# Patient Record
Sex: Male | Born: 1960 | Race: Black or African American | Hispanic: No | Marital: Married | Smoking: Never smoker
Health system: Southern US, Community
[De-identification: ages and names within clinical notes are randomized; demographics above are authoritative.]

## PROBLEM LIST (undated history)

## (undated) DIAGNOSIS — Z789 Other specified health status: Secondary | ICD-10-CM

## (undated) DIAGNOSIS — Z905 Acquired absence of kidney: Secondary | ICD-10-CM

## (undated) DIAGNOSIS — Z8719 Personal history of other diseases of the digestive system: Secondary | ICD-10-CM

## (undated) DIAGNOSIS — C801 Malignant (primary) neoplasm, unspecified: Secondary | ICD-10-CM

## (undated) DIAGNOSIS — I10 Essential (primary) hypertension: Secondary | ICD-10-CM

## (undated) HISTORY — PX: COLON SURGERY: SHX602

## (undated) HISTORY — PX: COLONOSCOPY: SHX174

## (undated) HISTORY — PX: APPENDECTOMY: SHX54

## (undated) HISTORY — PX: NO PAST SURGERIES: SHX2092

---

## 2008-07-02 ENCOUNTER — Emergency Department: Payer: Self-pay | Admitting: Internal Medicine

## 2013-02-17 ENCOUNTER — Ambulatory Visit: Payer: Self-pay | Admitting: Gastroenterology

## 2015-05-03 DIAGNOSIS — M19011 Primary osteoarthritis, right shoulder: Secondary | ICD-10-CM | POA: Insufficient documentation

## 2015-07-21 ENCOUNTER — Encounter: Payer: Self-pay | Admitting: *Deleted

## 2015-07-26 ENCOUNTER — Ambulatory Visit: Payer: BLUE CROSS/BLUE SHIELD | Admitting: Student in an Organized Health Care Education/Training Program

## 2015-07-26 ENCOUNTER — Ambulatory Visit
Admission: RE | Admit: 2015-07-26 | Discharge: 2015-07-26 | Disposition: A | Discharge status: Home / Self Care | Payer: BLUE CROSS/BLUE SHIELD | Source: Ambulatory Visit | Attending: Surgery | Admitting: Surgery

## 2015-07-26 ENCOUNTER — Encounter
Admission: RE | Disposition: A | Discharge status: Home / Self Care | Payer: Self-pay | Source: Ambulatory Visit | Attending: Surgery

## 2015-07-26 DIAGNOSIS — Z8042 Family history of malignant neoplasm of prostate: Secondary | ICD-10-CM | POA: Insufficient documentation

## 2015-07-26 DIAGNOSIS — Z803 Family history of malignant neoplasm of breast: Secondary | ICD-10-CM | POA: Insufficient documentation

## 2015-07-26 DIAGNOSIS — Z8249 Family history of ischemic heart disease and other diseases of the circulatory system: Secondary | ICD-10-CM | POA: Insufficient documentation

## 2015-07-26 DIAGNOSIS — M19011 Primary osteoarthritis, right shoulder: Secondary | ICD-10-CM | POA: Insufficient documentation

## 2015-07-26 DIAGNOSIS — Z79899 Other long term (current) drug therapy: Secondary | ICD-10-CM | POA: Insufficient documentation

## 2015-07-26 HISTORY — DX: Essential (primary) hypertension: I10

## 2015-07-26 HISTORY — DX: Other specified health status: Z78.9

## 2015-07-26 HISTORY — PX: RESECTION DISTAL CLAVICAL: SHX5053

## 2015-07-26 SURGERY — EXCISION, CLAVICLE, DISTAL, OPEN
Anesthesia: Regional | Laterality: Right | Wound class: Clean

## 2015-07-26 MED ORDER — GLYCOPYRROLATE 0.2 MG/ML IJ SOLN
INTRAMUSCULAR | Status: DC | PRN
  Administered 2015-07-26: 0.2 mg via INTRAVENOUS

## 2015-07-26 MED ORDER — HYDROCODONE-ACETAMINOPHEN 5-325 MG PO TABS: 1.0000 | ORAL_TABLET | Freq: Four times a day (QID) | ORAL | Status: DC | PRN

## 2015-07-26 MED ORDER — ACETAMINOPHEN 160 MG/5ML PO SOLN: 325.0000 mg | ORAL | Status: DC | PRN

## 2015-07-26 MED ORDER — PROPOFOL 10 MG/ML IV BOLUS
INTRAVENOUS | Status: DC | PRN
  Administered 2015-07-26: 180 mg via INTRAVENOUS

## 2015-07-26 MED ORDER — FENTANYL CITRATE (PF) 100 MCG/2ML IJ SOLN: 25.0000 ug | INTRAMUSCULAR | Status: DC | PRN

## 2015-07-26 MED ORDER — FENTANYL CITRATE (PF) 100 MCG/2ML IJ SOLN
INTRAMUSCULAR | Status: DC | PRN
  Administered 2015-07-26: 100 ug via INTRAVENOUS

## 2015-07-26 MED ORDER — OXYCODONE HCL 5 MG PO TABS: 5.0000 mg | ORAL_TABLET | Freq: Once | ORAL | Status: DC | PRN

## 2015-07-26 MED ORDER — DEXAMETHASONE SODIUM PHOSPHATE 4 MG/ML IJ SOLN: 8.0000 mg | Freq: Once | INTRAMUSCULAR | Status: DC | PRN

## 2015-07-26 MED ORDER — DEXAMETHASONE SODIUM PHOSPHATE 4 MG/ML IJ SOLN
INTRAMUSCULAR | Status: DC | PRN
  Administered 2015-07-26: 4 mg via PERINEURAL
  Administered 2015-07-26: 4 mg via INTRAVENOUS

## 2015-07-26 MED ORDER — ACETAMINOPHEN 325 MG PO TABS: 325.0000 mg | ORAL_TABLET | ORAL | Status: DC | PRN

## 2015-07-26 MED ORDER — ONDANSETRON HCL 4 MG/2ML IJ SOLN
INTRAMUSCULAR | Status: DC | PRN
  Administered 2015-07-26: 4 mg via INTRAVENOUS

## 2015-07-26 MED ORDER — CEFAZOLIN SODIUM-DEXTROSE 2-4 GM/100ML-% IV SOLN
2.0000 g | Freq: Once | INTRAVENOUS | Status: AC
  Administered 2015-07-26: 2 g via INTRAVENOUS

## 2015-07-26 MED ORDER — ROPIVACAINE HCL 5 MG/ML IJ SOLN
INTRAMUSCULAR | Status: DC | PRN
  Administered 2015-07-26: 35 mL via PERINEURAL

## 2015-07-26 MED ORDER — MIDAZOLAM HCL 2 MG/2ML IJ SOLN
INTRAMUSCULAR | Status: DC | PRN
  Administered 2015-07-26: 2 mg via INTRAVENOUS

## 2015-07-26 MED ORDER — LACTATED RINGERS IV SOLN
INTRAVENOUS | Status: DC
  Administered 2015-07-26: 10:00:00 via INTRAVENOUS

## 2015-07-26 MED ORDER — BUPIVACAINE-EPINEPHRINE 0.5% -1:200000 IJ SOLN
INTRAMUSCULAR | Status: DC | PRN
  Administered 2015-07-26: 20 mL

## 2015-07-26 MED ORDER — LIDOCAINE HCL (CARDIAC) 20 MG/ML IV SOLN
INTRAVENOUS | Status: DC | PRN
  Administered 2015-07-26: 30 mg via INTRATRACHEAL

## 2015-07-26 MED ORDER — OXYCODONE HCL 5 MG/5ML PO SOLN: 5.0000 mg | Freq: Once | ORAL | Status: DC | PRN

## 2015-07-26 SURGICAL SUPPLY — 24 items
BENZOIN TINCTURE PRP APPL 2/3 (GAUZE/BANDAGES/DRESSINGS) ×3 IMPLANT
BLADE MED AGGRESSIVE (BLADE) ×3 IMPLANT
CANISTER SUCT 1200ML W/VALVE (MISCELLANEOUS) ×3 IMPLANT
CHLORAPREP W/TINT 26ML (MISCELLANEOUS) ×3 IMPLANT
COVER LIGHT HANDLE UNIVERSAL (MISCELLANEOUS) ×6 IMPLANT
DRAPE IMP U-DRAPE 54X76 (DRAPES) ×3 IMPLANT
DRSG TEGADERM 4X4.75 (GAUZE/BANDAGES/DRESSINGS) ×6 IMPLANT
GAUZE PETRO XEROFOAM 1X8 (MISCELLANEOUS) ×3 IMPLANT
GAUZE SPONGE 4X4 12PLY STRL (GAUZE/BANDAGES/DRESSINGS) ×3 IMPLANT
GLOVE BIO SURGEON STRL SZ8 (GLOVE) ×6 IMPLANT
GLOVE INDICATOR 8.0 STRL GRN (GLOVE) ×3 IMPLANT
GOWN STRL REUS W/ TWL LRG LVL3 (GOWN DISPOSABLE) ×1 IMPLANT
GOWN STRL REUS W/ TWL XL LVL3 (GOWN DISPOSABLE) ×1 IMPLANT
GOWN STRL REUS W/TWL LRG LVL3 (GOWN DISPOSABLE) ×2
GOWN STRL REUS W/TWL XL LVL3 (GOWN DISPOSABLE) ×2
KIT ROOM TURNOVER OR (KITS) ×3 IMPLANT
NEEDLE HYPO 21X1.5 SAFETY (NEEDLE) ×3 IMPLANT
NS IRRIG 500ML POUR BTL (IV SOLUTION) ×3 IMPLANT
PACK ARTHROSCOPY SHOULDER (MISCELLANEOUS) ×3 IMPLANT
SLING ARM LRG DEEP (SOFTGOODS) ×3 IMPLANT
STAPLER SKIN PROX 35W (STAPLE) ×3 IMPLANT
STRAP BODY AND KNEE 60X3 (MISCELLANEOUS) ×3 IMPLANT
SUT VIC AB 2-0 CT1 27 (SUTURE) ×4
SUT VIC AB 2-0 CT1 TAPERPNT 27 (SUTURE) ×2 IMPLANT

## 2015-07-26 NOTE — Transfer of Care (Signed)
Immediate Anesthesia Transfer of Care Note  Patient: Patrick Pittman  Procedure(s) Performed: Procedure(s): RESECTION DISTAL CLAVICAL (Right)  Patient Location: PACU  Anesthesia Type: General, Regional  Level of Consciousness: awake, alert  and patient cooperative  Airway and Oxygen Therapy: Patient Spontanous Breathing and Patient connected to supplemental oxygen  Post-op Assessment: Post-op Vital signs reviewed, Patient's Cardiovascular Status Stable, Respiratory Function Stable, Patent Airway and No signs of Nausea or vomiting  Post-op Vital Signs: Reviewed and stable  Complications: No apparent anesthesia complications

## 2015-07-26 NOTE — Discharge Instructions (Addendum)
General Anesthesia, Adult, Care After Refer to this sheet in the next few weeks. These instructions provide you with information on caring for yourself after your procedure. Your health care provider may also give you more specific instructions. Your treatment has been planned according to current medical practices, but problems sometimes occur. Call your health care provider if you have any problems or questions after your procedure. WHAT TO EXPECT AFTER THE PROCEDURE After the procedure, it is typical to experience:  Sleepiness.  Nausea and vomiting. HOME CARE INSTRUCTIONS  For the first 24 hours after general anesthesia:  Have a responsible person with you.  Do not drive a car. If you are alone, do not take public transportation.  Do not drink alcohol.  Do not take medicine that has not been prescribed by your health care provider.  Do not sign important papers or make important decisions.  You may resume a normal diet and activities as directed by your health care provider.  Change bandages (dressings) as directed.  If you have questions or problems that seem related to general anesthesia, call the hospital and ask for the anesthetist or anesthesiologist on call. SEEK MEDICAL CARE IF:  You have nausea and vomiting that continue the day after anesthesia.  You develop a rash. SEEK IMMEDIATE MEDICAL CARE IF:   You have difficulty breathing.  You have chest pain.  You have any allergic problems.   This information is not intended to replace advice given to you by your health care provider. Make sure you discuss any questions you have with your health care provider.   Document Released: 04/01/2000 Document Revised: 01/14/2014 Document Reviewed: 04/24/2011 Elsevier Interactive Patient Education 2016 [employer].  Keep sling on until nerve block wears off, then use sling as necessary for comfort. May shower with intact op-site dressing. Apply ice to affected area  frequently. Return for follow-up in 10-14 days or as scheduled.

## 2015-07-26 NOTE — Progress Notes (Signed)
Assisted Patrick Pittman ANMD with right, ultrasound guided, supraclavicular block. Side rails up, monitors on throughout procedure. See vital signs in flow sheet. Tolerated Procedure well.

## 2015-07-26 NOTE — Op Note (Signed)
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  Post-Op Diagnosis:   Same.  Procedure:   Open excision of right distal clavicle.  Surgeon:   Pascal Lux, MD  Assistant:   None  Anesthesia:   General LMA with an interscalene block placed preoperatively by the anesthesiologist  Findings:   As above.  Complications:   None  EBL:   20 cc  Fluids:   900 cc crystalloid  TT:   None  Drains:   None  Closure:   Staples  Brief Clinical Note:   The patient is a 55 year old male with a several year history of gradually worsening superior right shoulder pain. The patient's past history is notable for having sustained a severe before meals separation approximately 30 years ago which was treated nonsurgically. He denies any recent injury to the shoulder. His symptoms have persisted despite medications, activity modification, etc. His history and examination are consistent with progressive degenerative joint disease of the [facility] joint. He presents at this time for open excision of the distal clavicle.  Procedure:   After undergoing an interscalene block in the preoperative holding area, the patient was brought into the operating room and lain in the supine position. He then underwent general laryngeal mask anesthesia before being repositioned in the beach chair position using the beach chair positioner. The right shoulder and upper extremity were prepped with ChloraPrep solution before being draped sterilely. Preoperative antibiotics were administered. A surgical timeout was performed to verify the appropriate surgical site before an approximately 5-6 cm incision was made over the lateral aspect of the distal clavicle. The incision was carried down through the subcutaneous tissues to expose the delto-trapezial fascia. This fascia was split horizontally and the soft  tissues elevated subperiosteally off the distal clavicle circumferentially. The distal 10-12 mm of the distal clavicle was removed using a micro-oscillating saw and rongeurs. Areas of heterotopic ossification also were removed. The distal clavicle was assessed and deemed stable to anterior posterior translation, as well as with caudal-cranial displacement. The wound was copiously irrigated with sterile saline solution before the delto-trapezial fascia was reapproximated using 2-0 Vicryl interrupted sutures. The subcutaneous tissues also were closed using 2-0 Vicryl interrupted sutures before the skin was closed using staples. A total of 20 cc of half percent Sensorcaine with epinephrine was injected in and around the incision to help with postoperative analgesia before a sterile occlusive dressing was applied to the wound. The patient was then awakened, extubated, and returned to the recovery room in satisfactory condition after tolerating the procedure well.

## 2015-07-26 NOTE — Anesthesia Procedure Notes (Addendum)
Anesthesia Regional Block:  Supraclavicular block  Pre-Anesthetic Checklist: ,, timeout performed, Correct Patient, Correct Site, Correct Laterality, Correct Procedure, Correct Position, site marked, Risks and benefits discussed,  Surgical consent,  Pre-op evaluation,  At surgeon's request and post-op pain management  Laterality: Right  Prep: chloraprep       Needles:  Injection technique: Single-shot  Needle Type: Echogenic Stimulator Needle      Needle Gauge: 21 and 21 G    Additional Needles:  Procedures: ultrasound guided (picture in chart) Supraclavicular block  Nerve Stimulator or Paresthesia:  Response: bicep contraction, 0.45 mA,   Additional Responses:   Narrative:  Start time: 07/26/2015 10:11 AM End time: 07/26/2015 10:18 AM Injection made incrementally with aspirations every 5 mL.  Performed by: Personally  Anesthesiologist: Marchia Bond D  Additional Notes: Functioning IV was confirmed and monitors applied.  Sterile prep and drape,hand hygiene and sterile gloves were used.Ultrasound guidance: relevant anatomy identified, needle position confirmed, local anesthetic spread visualized around nerve(s)., vascular puncture avoided.  Image printed for medical record.  Negative aspiration and negative test dose prior to incremental administration of local anesthetic. The patient tolerated the procedure well. Vitals signes recorded in RN notes.   Procedure Name: LMA Insertion Date/Time: 07/26/2015 11:50 AM Performed by: Londell Moh Pre-anesthesia Checklist: Patient identified, Emergency Drugs available, Suction available, Timeout performed and Patient being monitored Patient Re-evaluated:Patient Re-evaluated prior to inductionOxygen Delivery Method: Circle system utilized Preoxygenation: Pre-oxygenation with 100% oxygen Intubation Type: IV induction LMA: LMA inserted LMA Size: 4.0 Number of attempts: 1 Placement Confirmation: positive ETCO2 and breath sounds  checked- equal and bilateral Tube secured with: Tape

## 2015-07-26 NOTE — Anesthesia Preprocedure Evaluation (Signed)
Anesthesia Evaluation  Patient identified by MRN, date of birth, ID band Patient awake    Reviewed: Allergy & Precautions, H&P , NPO status , Patient's Chart, lab work & pertinent test results, reviewed documented beta blocker date and time   Airway Mallampati: II  TM Distance: >3 FB Neck ROM: full    Dental no notable dental hx.    Pulmonary neg pulmonary ROS,    Pulmonary exam normal breath sounds clear to auscultation       Cardiovascular Exercise Tolerance: Good hypertension, On Medications  Rhythm:regular Rate:Normal     Neuro/Psych negative neurological ROS  negative psych ROS   GI/Hepatic negative GI ROS, Neg liver ROS,   Endo/Other  negative endocrine ROS  Renal/GU negative Renal ROS  negative genitourinary   Musculoskeletal   Abdominal   Peds  Hematology  (+) JEH[employer]H'S WITNESS  Anesthesia Other Findings   Reproductive/Obstetrics negative OB ROS                             Anesthesia Physical Anesthesia Plan  ASA: II  Anesthesia Plan: General and Regional   Post-op Pain Management:    Induction:   Airway Management Planned:   Additional Equipment:   Intra-op Plan:   Post-operative Plan:   Informed Consent: I have reviewed the patients History and Physical, chart, labs and discussed the procedure including the risks, benefits and alternatives for the proposed anesthesia with the patient or authorized representative who has indicated his/her understanding and acceptance.     Plan Discussed with: CRNA  Anesthesia Plan Comments:         Anesthesia Quick Evaluation

## 2015-07-26 NOTE — H&P (Signed)
Paper H&P to be scanned into permanent record. H&P reviewed. No changes. 

## 2015-07-26 NOTE — Anesthesia Postprocedure Evaluation (Signed)
Anesthesia Post Note  Patient: Patrick Pittman  Procedure(s) Performed: Procedure(s) (LRB): RESECTION DISTAL CLAVICAL (Right)  Patient location during evaluation: PACU Anesthesia Type: General and Regional Level of consciousness: awake and alert Pain management: pain level controlled Vital Signs Assessment: post-procedure vital signs reviewed and stable Respiratory status: spontaneous breathing, nonlabored ventilation and respiratory function stable Cardiovascular status: blood pressure returned to baseline and stable Postop Assessment: no signs of nausea or vomiting Anesthetic complications: no    Shonda Mandarino D Marshella Tello

## 2015-07-27 ENCOUNTER — Encounter: Payer: Self-pay | Admitting: Surgery

## 2015-08-01 ENCOUNTER — Encounter: Payer: Self-pay | Admitting: Surgery

## 2018-10-02 ENCOUNTER — Other Ambulatory Visit: Payer: Self-pay | Admitting: Internal Medicine

## 2018-10-02 DIAGNOSIS — Z20822 Contact with and (suspected) exposure to covid-19: Secondary | ICD-10-CM

## 2018-10-03 LAB — NOVEL CORONAVIRUS, NAA: SARS-CoV-2, NAA: NOT DETECTED

## 2019-01-27 ENCOUNTER — Encounter: Payer: Self-pay | Admitting: Surgery

## 2019-01-27 ENCOUNTER — Other Ambulatory Visit: Payer: Self-pay

## 2019-01-27 ENCOUNTER — Encounter: Payer: Self-pay | Admitting: Urology

## 2019-01-27 ENCOUNTER — Ambulatory Visit (INDEPENDENT_AMBULATORY_CARE_PROVIDER_SITE_OTHER): Payer: BC Managed Care – PPO | Admitting: Urology

## 2019-01-27 VITALS — BP 172/107 | HR 73 | Ht 68.0 in | Wt 227.0 lb

## 2019-01-27 DIAGNOSIS — R972 Elevated prostate specific antigen [PSA]: Secondary | ICD-10-CM | POA: Diagnosis not present

## 2019-01-27 DIAGNOSIS — Z8042 Family history of malignant neoplasm of prostate: Secondary | ICD-10-CM

## 2019-01-27 DIAGNOSIS — N5203 Combined arterial insufficiency and corporo-venous occlusive erectile dysfunction: Secondary | ICD-10-CM

## 2019-01-27 NOTE — Patient Instructions (Signed)

## 2019-01-27 NOTE — Progress Notes (Signed)
01/27/2019 12:27 PM   Patrick Pittman Apr 11, 1960 [id number]  Referring provider: Theotis Burrow, MD [address] [name],  [name] [address]  Chief Complaint  Patient presents with  . Elevated PSA    HPI: Extremely pleasant 59 year old male who presents today for further evaluation of elevated and rising PSA.  Patient underwent routine annual PSA screening by his primary care physician.  His PSA was noted to be markedly elevated to 7.3 on 11/2018.  This was repeated at the summer and had gone down to 6.2.  We have a few historical data points including a PSA of 5.41 in 2017 and 4.2 in 2018.  He has a strong family history of prostate cancer.  His father was diagnosed with prostate cancer in his 63s and underwent prostatectomy.  He also has 4 brothers all of whom have been diagnosed and treated for prostate cancer.  Up until this point, he thought that the prostate cancer had "skipped" him.  He denies any urinary symptoms.  Is a good stream.  He empties his bladder.  No gross hematuria, recurrent urinary tract infections,   No weight loss or bone pain.  He does have a personal history of erectile dysfunction.  He reports that he is in his second marriage and has a 1 and 12-year-old.  He feels like he needs Viagra because of this.  Works well.  No side effects.   PMH: Past Medical History:  Diagnosis Date  . Hypertension   . No blood products    Jehovah's witness    Surgical History: Past Surgical History:  Procedure Laterality Date  . COLONOSCOPY    . NO PAST SURGERIES    . RESECTION DISTAL CLAVICAL Right 07/26/2015   Procedure: Open excision of right distal clavicle.;  Surgeon: Corky Mull, MD;  Location: [facility];  Service: Orthopedics;  Laterality: Right;    Home Medications:  Allergies as of 01/27/2019   No Known Allergies     Medication List       Accurate as of January 27, 2019 12:27 PM. If you have any questions, ask your nurse or  doctor.        hydrochlorothiazide 25 MG tablet Commonly known as: HYDRODIURIL Take 25 mg by mouth daily.   HYDROcodone-acetaminophen 5-325 MG tablet Commonly known as: Norco Take 1-2 tablets by mouth every 6 (six) hours as needed for moderate pain. MAXIMUM TOTAL ACETAMINOPHEN DOSE IS 4000 MG PER DAY   lisinopril 10 MG tablet Commonly known as: ZESTRIL Take 10 mg by mouth daily.   sildenafil 20 MG tablet Commonly known as: REVATIO Take 20 mg by mouth. Take 2-5 tablets by mouth as needed       Allergies: No Known Allergies  Family History: Family History  Problem Relation Age of Onset  . Prostate cancer Father   . Prostate cancer Brother   . Prostate cancer Brother   . Prostate cancer Brother   . Prostate cancer Brother   . Prostate cancer Brother     Social History:  reports that he has never smoked. He has never used smokeless tobacco. He reports previous alcohol use. No history on file for drug.  ROS: UROLOGY Frequent Urination?: No Hard to postpone urination?: No Burning/pain with urination?: No Get up at night to urinate?: No Leakage of urine?: No Urine stream starts and stops?: No Trouble starting stream?: No Do you have to strain to urinate?: No Blood in urine?: No Urinary tract infection?: No  Sexually transmitted disease?: No Injury to kidneys or bladder?: No Painful intercourse?: No Weak stream?: No Erection problems?: No Penile pain?: No  Gastrointestinal Nausea?: No Vomiting?: No Indigestion/heartburn?: No Diarrhea?: No Constipation?: No  Constitutional Fever: No Night sweats?: No Weight loss?: No Fatigue?: No  Skin Skin rash/lesions?: No Itching?: No  Eyes Blurred vision?: No Double vision?: No  Ears/Nose/Throat Sore throat?: No Sinus problems?: No  Hematologic/Lymphatic Swollen glands?: No Easy bruising?: No  Cardiovascular Leg swelling?: No Chest pain?: No  Respiratory Cough?: No Shortness of breath?:  No  Endocrine Excessive thirst?: No  Musculoskeletal Back pain?: No Joint pain?: No  Neurological Headaches?: No Dizziness?: No  Psychologic Depression?: No Anxiety?: No  Physical Exam: BP (!) 172/107   Pulse 73   Ht 5\' 8"  (1.727 m)   Wt 227 lb (103 kg)   BMI 34.52 kg/m   Constitutional:  Alert and oriented, No acute distress. HEENT: [place] AT, moist mucus membranes.  Trachea midline, no masses. Cardiovascular: No clubbing, cyanosis, or edema. Respiratory: Normal respiratory effort, no increased work of breathing. GI: Abdomen is soft, nontender, nondistended, no abdominal masses Rectal: Normal rectal tone.  Secondary to habitus and compliance, only able to palpate the apex of the prostate which is abnormal. Skin: No rashes, bruises or suspicious lesions. Neurologic: Grossly intact, no focal deficits, moving all 4 extremities. Psychiatric: Normal mood and affect.  Laboratory Data: See trend as above  Assessment & Plan:    1. Elevated prostate specific antigen (PSA)  We reviewed the implications of an elevated PSA and the uncertainty surrounding it. In general, a man's PSA increases with age and is produced by both normal and cancerous prostate tissue. Differential for elevated PSA is BPH, prostate cancer, infection, recent intercourse/ejaculation, prostate infarction, recent urethroscopic manipulation (foley placement/cystoscopy) and prostatitis. Management of an elevated PSA can include observation or prostate biopsy and wediscussed this in detail. We discussed that indications for prostate biopsy are defined by age and race specific PSA cutoffs as well as a PSA velocity of 0.75/year.  Given his markedly elevated PSA and strong family history of prostate cancer, we discussed most likely we recommend prostate biopsy.  PSA is being repeated today and will call with results tomorrow with final recommendations.  We did go ahead and discuss prostate biopsy in detail today.We  discussed prostate biopsy in detail including the procedure itself, the risks of blood in the urine, stool, and ejaculate, serious infection, and discomfort. He is willing to proceed with this as discussed. - PSA  2. Family history of prostate cancer Strong family history, may warrant genetics referral if his prostate biopsy is positive  3. Combined arterial insufficiency and corporo-venous occlusive erectile dysfunction Continue sildenafil as needed   Return for Will call with results, to be announced.  Hollice Espy, MD  [facility] [address] [place], [place] [address] [phone]

## 2019-01-28 ENCOUNTER — Telehealth: Payer: Self-pay | Admitting: *Deleted

## 2019-01-28 LAB — PSA: Prostate Specific Ag, Serum: 6 ng/mL — ABNORMAL HIGH (ref 0.0–4.0)

## 2019-01-28 NOTE — Telephone Encounter (Addendum)
Left patient a VM asked to return call to setup appointment.    ----- Message from Hollice Espy, MD sent at 01/28/2019  2:55 PM EST ----- PSA is still quite elevated 6.0.  Given his strong family history and elevated PSA for his age, I think he just needs a prostate biopsy at his as discussed.  He was given the handout in the office.  Please schedule.  Hollice Espy, MD

## 2019-02-03 NOTE — Telephone Encounter (Signed)
Reached pt, appt scheduled for Feb. Pt voiced understanding for instructions.

## 2019-02-24 ENCOUNTER — Other Ambulatory Visit: Payer: Self-pay | Admitting: Urology

## 2019-02-24 ENCOUNTER — Other Ambulatory Visit: Payer: Self-pay

## 2019-02-24 ENCOUNTER — Encounter: Payer: Self-pay | Admitting: Urology

## 2019-02-24 ENCOUNTER — Ambulatory Visit (INDEPENDENT_AMBULATORY_CARE_PROVIDER_SITE_OTHER): Payer: BC Managed Care – PPO | Admitting: Urology

## 2019-02-24 VITALS — BP 167/77 | HR 72 | Ht 68.0 in | Wt 223.0 lb

## 2019-02-24 DIAGNOSIS — R972 Elevated prostate specific antigen [PSA]: Secondary | ICD-10-CM | POA: Diagnosis not present

## 2019-02-24 MED ORDER — GENTAMICIN SULFATE 40 MG/ML IJ SOLN
80.0000 mg | Freq: Once | INTRAMUSCULAR | Status: AC
  Administered 2019-02-24: 09:00:00 80 mg via INTRAMUSCULAR

## 2019-02-24 MED ORDER — LEVOFLOXACIN 500 MG PO TABS
500.0000 mg | ORAL_TABLET | Freq: Once | ORAL | Status: AC
  Administered 2019-02-24: 500 mg via ORAL

## 2019-02-24 NOTE — Progress Notes (Signed)
   02/24/19  CC:  Chief Complaint  Patient presents with  . Prostate Biopsy    HPI: 59 yo M with elevated PSA, family history of prostate cancer here for prostate biopsy.   Please see previous notes for detail.     Blood pressure (!) 167/77, pulse 72, height 5\' 8"  (1.727 m), weight 223 lb (101.2 kg). NED. A&Ox3.   No respiratory distress   Abd soft, NT, ND Normal sphincter tone  Prostate Biopsy Procedure   Informed consent was obtained after discussing risks/benefits of the procedure.  A time out was performed to ensure correct patient identity.  Pre-Procedure: - Gentamicin given prophylactically - Levaquin 500 mg administered PO -Transrectal Ultrasound performed revealing a 117 gm prostate -Moderate median lobe present  Procedure: - Prostate block performed using 10 cc 1% lidocaine and biopsies taken from sextant areas, a total of 12 under ultrasound guidance.  Post-Procedure: - Patient tolerated the procedure well - He was counseled to seek immediate medical attention if experiences any severe pain, significant bleeding, or fevers - Return in two week to discuss biopsy results   Hollice Espy, MD

## 2019-03-04 LAB — ANATOMIC PATHOLOGY REPORT: PDF Image: 0

## 2019-03-09 ENCOUNTER — Other Ambulatory Visit: Payer: Self-pay | Admitting: Urology

## 2019-03-09 NOTE — Progress Notes (Addendum)
03/10/2019 1:16 PM   Patrick Pittman 05-04-1960 [id number]    Chief Complaint  Patient presents with  . Results    HPI: Patrick Pittman is a 59 yo [place] American M who returns today to discuss newly dx prostate cancer.  Patient underwent routine annual PSA screening by his primary care physician.  His PSA was noted to be markedly elevated to 7.3 on 11/2018.  This was repeated at the summer and had gone down to 6.2.  We have a few historical data points including a PSA of 5.41 in 2017 and 4.2 in 2018. His most recent PSA is 6.0 on 01/27/19.   He had a prostate bx on 02/24/19. His path report indicates 5 of 12 cores involved low volume Gleason 3+3 up to 10% of tissue bilaterally primarily at apex and lateral bases.  TRUS 117 g/    + FH prostate cancer, several brothers treated  Baseline SHIM/ IPSS as below.   Minimal urinary symptoms despite significant prostamegaly.   SHIM    Row Name 03/10/19 1133         SHIM: Over the last 6 months:   How do you rate your confidence that you could get and keep an erection?  Low     When you had erections with sexual stimulation, how often were your erections hard enough for penetration (entering your partner)?  Sometimes (about half the time)     During sexual intercourse, how often were you able to maintain your erection after you had penetrated (entered) your partner?  Sometimes (about half the time)     During sexual intercourse, how difficult was it to maintain your erection to completion of intercourse?  Slightly Difficult     When you attempted sexual intercourse, how often was it satisfactory for you?  Most Times (much more than half the time)       SHIM Total Score   SHIM  16        Score: 1-7 Severe ED 8-11 Moderate ED 12-16 Mild-Moderate ED 17-21 Mild ED 22-25 No ED  IPSS    Row Name 03/10/19 1100         International Prostate Symptom Score   How often have you had the sensation of not emptying your bladder?   Not at All     How often have you had to urinate less than every two hours?  Less than 1 in 5 times     How often have you found you stopped and started again several times when you urinated?  Less than 1 in 5 times     How often have you found it difficult to postpone urination?  Less than 1 in 5 times     How often have you had a weak urinary stream?  Less than 1 in 5 times     How often have you had to strain to start urination?  Less than 1 in 5 times     How many times did you typically get up at night to urinate?  None     Total IPSS Score  5       Quality of Life due to urinary symptoms   If you were to spend the rest of your life with your urinary condition just the way it is now how would you feel about that?  Mostly Satisfied       Score:  1-7 Mild 8-19 Moderate 20-35 Severe  PMH: Past Medical History:  Diagnosis  Date  . Hypertension   . No blood products    Jehovah's witness    Surgical History: Past Surgical History:  Procedure Laterality Date  . COLONOSCOPY    . NO PAST SURGERIES    . RESECTION DISTAL CLAVICAL Right 07/26/2015   Procedure: Open excision of right distal clavicle.;  Surgeon: Corky Mull, MD;  Location: [facility];  Service: Orthopedics;  Laterality: Right;    Home Medications:  Allergies as of 03/10/2019   No Known Allergies     Medication List       Accurate as of March 10, 2019  1:16 PM. If you have any questions, ask your nurse or doctor.        hydrochlorothiazide 25 MG tablet Commonly known as: HYDRODIURIL Take 25 mg by mouth daily.   lisinopril 10 MG tablet Commonly known as: ZESTRIL Take 10 mg by mouth daily.   sildenafil 20 MG tablet Commonly known as: REVATIO Take 20 mg by mouth. Take 2-5 tablets by mouth as needed       Allergies: No Known Allergies  Family History: Family History  Problem Relation Age of Onset  . Prostate cancer Father   . Prostate cancer Brother   . Prostate cancer Brother   . Prostate  cancer Brother   . Prostate cancer Brother   . Prostate cancer Brother     Social History:  reports that he has never smoked. He has never used smokeless tobacco. He reports previous alcohol use. No history on file for drug.   Physical Exam: BP (!) 140/97   Pulse 86   Ht 5\' 8"  (1.727 m)   Wt 223 lb (101.2 kg)   BMI 33.91 kg/m   Constitutional:  Alert and oriented, No acute distress. HEENT: [place] AT, moist mucus membranes.  Trachea midline, no masses. Cardiovascular: No clubbing, cyanosis, or edema. Respiratory: Normal respiratory effort, no increased work of breathing. Skin: No rashes, bruises or suspicious lesions. Neurologic: Grossly intact, no focal deficits, moving all 4 extremities. Psychiatric: Normal mood and affect.  Laboratory Data:   Assessment & Plan:    1. Prostate Cancer  The patient was counseled about the natural history of prostate cancer and the standard treatment options that are available for prostate cancer. It was explained to him how his age and life expectancy, clinical stage, Gleason score, and PSA affect his prognosis, the decision to proceed with additional staging studies, as well as how that information influences recommended treatment strategies. We discussed the roles for active surveillance, radiation therapy, surgical therapy, androgen deprivation, as well as ablative therapy options for the treatment of prostate cancer as appropriate to his individual cancer situation. We discussed the risks and benefits of these options with regard to their impact on cancer control and also in terms of potential adverse events, complications, and impact on quality of life particularly related to urinary, bowel, and sexual function. The patient was encouraged to ask questions throughout the discussion today and all questions were answered to his stated satisfaction. In addition, the patient was provided with and/or directed to appropriate resources and literature for further  education about prostate cancer treatment options.  Given low volume low risk disease he falls into low risk category and would most strongly recommend active surveillance with Prostate MRI and repeat biopsy in 1 year for confirmation.  Given fairly significant gland size, he may be better served with prostate MRI with consideration of fusion for more accurate evaluation in order to avoid sampling  error.  We will discuss this further in the future as deemed necessary.  Pt agreed for active surveillance, will f/u with PSA in 6 months  Return in about 6 months (around 09/10/2019) for PSA.   [facility] [address] [place], [place] [address] [phone]  I, Lucas Mallow, am acting as a scribe for Dr. Hollice Espy,  I have reviewed the above documentation for accuracy and completeness, and I agree with the above.   Hollice Espy, MD  I spent 35 total minutes on the day of the encounter including pre-visit review of the medical record, face-to-face time with the patient, and post visit ordering of labs/imaging/tests.

## 2019-03-10 ENCOUNTER — Encounter: Payer: Self-pay | Admitting: Urology

## 2019-03-10 ENCOUNTER — Other Ambulatory Visit: Payer: Self-pay

## 2019-03-10 ENCOUNTER — Ambulatory Visit (INDEPENDENT_AMBULATORY_CARE_PROVIDER_SITE_OTHER): Payer: BC Managed Care – PPO | Admitting: Urology

## 2019-03-10 VITALS — BP 140/97 | HR 86 | Ht 68.0 in | Wt 223.0 lb

## 2019-03-10 DIAGNOSIS — C61 Malignant neoplasm of prostate: Secondary | ICD-10-CM | POA: Diagnosis not present

## 2019-09-15 ENCOUNTER — Other Ambulatory Visit: Payer: Self-pay

## 2019-09-28 ENCOUNTER — Ambulatory Visit: Payer: Self-pay | Admitting: Urology

## 2021-01-18 ENCOUNTER — Other Ambulatory Visit: Payer: Self-pay | Admitting: Program of All-Inclusive Care for the Elderly (PACE) Provider Organization

## 2021-01-25 ENCOUNTER — Ambulatory Visit: Payer: BC Managed Care – PPO | Admitting: Urology

## 2021-02-12 NOTE — Progress Notes (Signed)
02/13/21 9:09 AM   Patrick Pittman [birth year] [id number]  Referring provider:  No referring provider defined for this encounter. Chief Complaint  Patient presents with   Prostate Cancer     HPI: Patrick Pittman is a 61 y.o.male with a personal history of prostate cancer who presents today for further evaluation of his prostate cancer.   Patient underwent routine annual PSA screening by his primary care physician.  His PSA was noted to be markedly elevated to 7.3 on 11/2018.  This was repeated at the summer and had gone down to 6.2.  We have a few historical data points including a PSA of 5.41 in 2017 and 4.2 in 2018. His most recent PSA is 6.0 on 01/27/19.    He had a prostate bx on 02/24/19. His path report indicates 5 of 12 cores involved low volume Gleason 3+3 up to 10% of tissue bilaterally primarily at apex and lateral bases.  TRUS 117 g/     He elected active surveillance but he did not follow-up since his prostate cancer diagnosis.   He has a family history of prostate cancer with several of his brothers being treated.   His most recent PSA was 6.4 on 01/12/2021.   He reports today that he has a weaker stream. He not very bothered by his urinary symptoms. IPSS as below.    IPSS     Row Name 02/13/21 0900         International Prostate Symptom Score   How often have you had the sensation of not emptying your bladder? Not at All     How often have you had to urinate less than every two hours? About half the time     How often have you found you stopped and started again several times when you urinated? More than half the time     How often have you found it difficult to postpone urination? Less than half the time     How often have you had a weak urinary stream? Less than 1 in 5 times     How often have you had to strain to start urination? About half the time     How many times did you typically get up at night to urinate? 1 Time     Total IPSS Score 14        Quality of Life due to urinary symptoms   If you were to spend the rest of your life with your urinary condition just the way it is now how would you feel about that? Mostly Satisfied              Score:  1-7 Mild 8-19 Moderate 20-35 Severe   PMH: Past Medical History:  Diagnosis Date   Hypertension    No blood products    Jehovah's witness    Surgical History: Past Surgical History:  Procedure Laterality Date   COLONOSCOPY     NO PAST SURGERIES     RESECTION DISTAL CLAVICAL Right 07/26/2015   Procedure: Open excision of right distal clavicle.;  Surgeon: Corky Mull, MD;  Location: [facility];  Service: Orthopedics;  Laterality: Right;    Home Medications:  Allergies as of 02/13/2021   No Known Allergies      Medication List        Accurate as of February 13, 2021  9:09 AM. If you have any questions, ask your nurse or doctor.  STOP taking these medications    hydrochlorothiazide 25 MG tablet Commonly known as: HYDRODIURIL Stopped by: Hollice Espy, MD   lisinopril 10 MG tablet Commonly known as: ZESTRIL Stopped by: Hollice Espy, MD       TAKE these medications    sildenafil 20 MG tablet Commonly known as: REVATIO Take 20 mg by mouth. Take 2-5 tablets by mouth as needed   valsartan-hydrochlorothiazide 80-12.5 MG tablet Commonly known as: DIOVAN-HCT Take 1 tablet by mouth daily.        Allergies: No Known Allergies  Family History: Family History  Problem Relation Age of Onset   Prostate cancer Father    Prostate cancer Brother    Prostate cancer Brother    Prostate cancer Brother    Prostate cancer Brother    Prostate cancer Brother     Social History:  reports that he has never smoked. He has never used smokeless tobacco. He reports that he does not currently use alcohol. No history on file for drug use.   Physical Exam: BP (!) 150/86    Pulse 64    Ht 5\' 8"  (1.727 m)    Wt 241 lb (109.3 kg)    BMI 36.64  kg/m   Constitutional:  Alert and oriented, No acute distress. HEENT: [place] AT, moist mucus membranes.  Trachea midline, no masses. Cardiovascular: No clubbing, cyanosis, or edema. Respiratory: Normal respiratory effort, no increased work of breathing. Rectal: Normal sphincter tone,  50+  CC prostate, smooth no nodules limited by habitus and prostate size, unable to palpate base  Skin: No rashes, bruises or suspicious lesions. Neurologic: Grossly intact, no focal deficits, moving all 4 extremities. Psychiatric: Normal mood and affect.   Assessment & Plan:    Prostate cancer  - PSA stably elevated.  - Rectal exam showed a very enlarged prostate  - Recommend he undergo repeat biopsy versus MRI of prostate for active surveillance.  -He is most interested in prostate MRI which was ordered today, will serve as baseline for future comparison as well as evaluate in the setting of significant prostamegaly - We expressed importance of continuing follow-up per NCCN guideline for active surveillance and that if he has difficulty with this then he may not be a great candidate for active surveillance. He assures that he will be able to follow-up today.  - MRI; scheduled   Return for PSA only in 6 months and PSA and DRE in 1 year  (We will call with prostate MRI results, arrange for follow-up if grossly abnormal requires further discussion)  I,Kailey Littlejohn,acting as a scribe for Hollice Espy, MD.,have documented all relevant documentation on the behalf of Hollice Espy, MD,as directed by  Hollice Espy, MD while in the presence of Hollice Espy, MD.   I have reviewed the above documentation for accuracy and completeness, and I agree with the above.   Hollice Espy, MD  [facility] [address] [place], [place] [address] [phone]

## 2021-02-13 ENCOUNTER — Other Ambulatory Visit: Payer: Self-pay

## 2021-02-13 ENCOUNTER — Ambulatory Visit (INDEPENDENT_AMBULATORY_CARE_PROVIDER_SITE_OTHER): Payer: BC Managed Care – PPO | Admitting: Urology

## 2021-02-13 ENCOUNTER — Encounter: Payer: Self-pay | Admitting: Urology

## 2021-02-13 VITALS — BP 150/86 | HR 64 | Ht 68.0 in | Wt 241.0 lb

## 2021-02-13 DIAGNOSIS — C61 Malignant neoplasm of prostate: Secondary | ICD-10-CM

## 2021-02-13 NOTE — Patient Instructions (Signed)
Prostate MRI Prep: ? ?1- No ejaculation 48 hours prior to exam ? ?2- No food or drink or caffeine 4 hours prior to exam ? ?3- Fleets enema needs to be done 4 hours prior to exam  ? ?4- Urinate just prior to exam  ?

## 2021-03-01 ENCOUNTER — Ambulatory Visit
Admission: RE | Admit: 2021-03-01 | Discharge: 2021-03-01 | Disposition: A | Discharge status: Home / Self Care | Payer: BC Managed Care – PPO | Source: Ambulatory Visit | Attending: Urology | Admitting: Urology

## 2021-03-01 ENCOUNTER — Other Ambulatory Visit: Payer: Self-pay

## 2021-03-01 DIAGNOSIS — C61 Malignant neoplasm of prostate: Secondary | ICD-10-CM | POA: Diagnosis present

## 2021-03-01 MED ORDER — GADOBUTROL 1 MMOL/ML IV SOLN
10.0000 mL | Freq: Once | INTRAVENOUS | Status: AC | PRN
  Administered 2021-03-01: 10 mL via INTRAVENOUS

## 2021-03-05 ENCOUNTER — Telehealth: Payer: Self-pay | Admitting: *Deleted

## 2021-03-05 DIAGNOSIS — R1900 Intra-abdominal and pelvic swelling, mass and lump, unspecified site: Secondary | ICD-10-CM

## 2021-03-05 DIAGNOSIS — C61 Malignant neoplasm of prostate: Secondary | ICD-10-CM

## 2021-03-05 NOTE — Telephone Encounter (Addendum)
Patient informed, voiced understanding.  Placed order for CT and fusion biopsy. Voiced understanding.     ----- Message from Hollice Espy, MD sent at 03/05/2021 12:49 PM EST ----- MRI showed 2 concerning findings.  First was in regards to his prostate, it looks like there is a high-grade lesion.  Would recommend a fusion biopsy for this.  If he would like to discuss this further, we can schedule follow-up with me first.  Secondly, there is an abnormal structure in the pelvis felt to either represent a fistula or tumor.  CT of the abdomen pelvis with oral and IV contrast is recommended.  Please order this study as well.  Depending on the findings, we will make the appropriate referral.  Hollice Espy, MD

## 2021-04-06 ENCOUNTER — Other Ambulatory Visit: Payer: Self-pay | Admitting: Urology

## 2021-04-10 NOTE — Progress Notes (Signed)
? ?04/11/21 ?9:05 AM  ? ?Shrihan Putt Petrucelli ?1960-08-24 ?[id number] ? ?Referring provider:  ?[facility] ?[place] ?[place],   [id number] ?Chief Complaint  ?Patient presents with  ? Prostate Cancer  ? ? ? ? ?HPI: ?KRISTJAN DERNER is a 61 y.o.male with a personal history of prostate cancer who presents today for fusion biopsy results.  ? ?Patient underwent routine annual PSA screening by his primary care physician.  His PSA was noted to be markedly elevated to 7.3 on 11/2018.  This was repeated at the summer and had gone down to 6.2.  We have a few historical data points including a PSA of 5.41 in 2017 and 4.2 in 2018. His most recent PSA is 6.0 on 01/27/19.  ?  ?He had a prostate bx on 02/24/19. His path report indicates 5 of 12 cores involved low volume Gleason 3+3 up to 10% of tissue bilaterally primarily at apex and lateral bases.  TRUS 117 g/  ? ?His most recent PSA was 6.4 on 01/12/2021.  ? ?He elected active surveillance but he did not follow-up since his prostate cancer diagnosis until 02/13/2021.  ? ?He underwent a prostate MRI on 03/01/2021 that visualized Anterior tumor in the LEFT paramidline mid to apical transitional zone bulging the capsule, suspicious for extracapsular extension of ?this PIRADS category 5 lesion. No signs of adenopathy or bone lesion in the pelvis. Degree of restricted diffusion is concerning for high-risk disease. 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  ? ?He underwent a fusion biopsy on 04/04/2021. Pathology showed Gleason 3+4 involving 1 core affecting 40% at left apex lateral, Gleason 3+3 involving 2 cores at the left apex and right apex lateral affecting up to 20% ? ?He is accompanied by his wife today. He has biological children.  He has a strong family history of prostate cancer, 5 brothers in the family all now  with prostate cancer. ? ?Moderate baseline erectile dysfunction. ? ?PMH: ?Past Medical History:  ?Diagnosis Date  ? Hypertension   ? No blood products   ? Jehovah's witness  ? ? ?Surgical History: ?Past Surgical History:  ?Procedure Laterality Date  ? COLONOSCOPY    ? NO PAST SURGERIES    ? RESECTION DISTAL CLAVICAL Right 07/26/2015  ? Procedure: Open excision of right distal clavicle.;  Surgeon: Corky Mull, MD;  Location: [facility];  Service: Orthopedics;  Laterality: Right;  ? ? ?Home Medications:  ?Allergies as of 04/11/2021   ?No Known Allergies ?  ? ?  ?Medication List  ?  ? ?  ? Accurate as of April 11, 2021 11:59 PM. If you have any questions, ask your nurse or doctor.  ?  ?  ? ?  ? ?STOP taking these medications   ? ?hydrochlorothiazide 25 MG tablet ?Commonly known as: HYDRODIURIL ?  ? ?  ? ?TAKE these medications   ? ?sildenafil 20 MG tablet ?Commonly known as: REVATIO ?Take 20 mg by mouth. Take 2-5 tablets by mouth as needed ?  ?valsartan-hydrochlorothiazide 80-12.5 MG tablet ?Commonly known as: DIOVAN-HCT ?Take 1 tablet by mouth daily. ?  ? ?  ? ? ?Allergies:  ?No Known Allergies ? ?Family History: ?Family History  ?Problem Relation Age of Onset  ? Prostate cancer Father   ? Prostate cancer Brother   ? Prostate cancer Brother   ? Prostate cancer Brother   ?  Prostate cancer Brother   ? Prostate cancer Brother   ? ? ?Social History:  reports that he has never smoked. He has never used smokeless tobacco. He reports that he does not currently use alcohol. No history on file for drug use. ? ? ?Physical Exam: ?BP (!) 147/88   Pulse 71   Ht '5\' 10"'$  (1.778 m)   Wt 232 lb (105.2 kg)   BMI 33.29 kg/m?   ?Constitutional:  Alert and oriented, No acute distress. ?HEENT:  AT, moist mucus membranes.  Trachea midline, no masses. ?Cardiovascular: No clubbing, cyanosis, or edema. ?Respiratory: Normal respiratory effort, no increased work of breathing. ?Skin: No rashes, bruises or suspicious  lesions. ?Neurologic: Grossly intact, no focal deficits, moving all 4 extremities. ?Psychiatric: Normal mood and affect. ? ? ?Pertinent Imaging: ?CLINICAL DATA:  Prostate cancer in a 61 year old male. ?  ?EXAM: ?MR PROSTATE WITHOUT AND WITH CONTRAST ?  ?TECHNIQUE: ?Multiplanar multisequence MRI images were obtained of the pelvis ?centered about the prostate. Pre and post contrast images were ?obtained. ?  ?CONTRAST:  59m GADAVIST GADOBUTROL 1 MMOL/ML IV SOLN ?  ?COMPARISON:  None ?  ?FINDINGS: ?Prostate: ?  ?Transitional zone: In the anterior LEFT mid to apical transitional ?zone extending towards the prostate apex is a homogeneous but ?indistinct area of T2 hypointensity measuring 2.6 x 1.6 cm and ?showing marked restricted diffusion bulging the anterior capsule ?(image 24/5). PIRADS category 5 lesion. ?  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 ?  ?Peripheral zone: Linear and wedge-shaped areas of T2 hypointensity ?throughout the transitional zone. ?  ?Volume: 150.4 ?  ?Transcapsular spread: Bulging along the anterior prostate and long ?segment contact with the anterior capsule at high-risk for after ?capsular extension. Signs best reflected on image 24/5. ?  ?Seminal vesicle involvement: Absent ?  ?Neurovascular bundle involvement: Absent ?  ?Pelvic adenopathy: Absent ?  ?Bone metastasis: Absent ?  ?Other findings: An ovoid vaguely tubular structure in the RIGHT ?pelvis closely associated with the sigmoid colon and just anterior ?to the RIGHT psoas the base of which tracks towards the cecal tip ?measuring 19 x 30 mm (image 19/60). Only seen on 3 sequences and not ?on T2 weighted imaging. ?  ?IMPRESSION: ?1. Anterior tumor in the LEFT paramidline mid to apical transitional ?zone bulging the capsule, suspicious for extracapsular extension of ?this PIRADS category 5 lesion. ?2. No signs of adenopathy or bone lesion in the pelvis. Degree of ?restricted diffusion is concerning for high-risk  disease. ?3. 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 Suggest ?dedicated CT imaging for further evaluation or correlation with any ?recent cross-sectional imaging if available. ?  ?These results will be called to the ordering clinician or ?representative by the Radiologist Assistant, and communication ?documented in the PACS or [employer] ?  ?  ?Electronically Signed ?  By: GZetta BillsM.D. ?  On: 03/03/2021 15:44 ? ? ?I have personally reviewed the images and agree with radiologist interpretation.  ? ? ? ?Assessment & Plan:   ? ?Prostate cancer  ? ?-Favorable intermediate  risk, new upstaging, previously on active surveillance for low risk ?- The patient was counseled about the natural history of prostate cancer and the standard treatment options that are available for prostate cancer. It was explained to him how his age and life expectancy, clinical stage, Gleason score, and PSA affect his prognosis,  the decision to proceed with additional staging studies, as well as how that information influences recommended treatment strategies. We discussed the roles for active surveillance, radiation therapy, surgical therapy, androgen deprivation, as well as ablative therapy options for the treatment of prostate cancer as appropriate to his individual cancer situation. We discussed the risks and benefits of these options with regard to their impact on cancer control and also in terms of potential adverse events, complications, and impact on quality of life particularly related to urinary, bowel, and sexual function. The patient was encouraged to ask questions throughout the discussion today and all questions were answered to his stated satisfaction. In addition, the patient was provided with and/or directed to appropriate resources and literature for further  education about prostate cancer treatment options. ? ?We discussed surgical therapy for prostate cancer including the different available surgical approaches.  Specifically, we discussed robotic prostatectomy with pelvic lymph node dissectio

## 2021-04-11 ENCOUNTER — Ambulatory Visit (INDEPENDENT_AMBULATORY_CARE_PROVIDER_SITE_OTHER): Payer: BC Managed Care – PPO | Admitting: Urology

## 2021-04-11 VITALS — BP 147/88 | HR 71 | Ht 70.0 in | Wt 232.0 lb

## 2021-04-11 DIAGNOSIS — C61 Malignant neoplasm of prostate: Secondary | ICD-10-CM

## 2021-04-12 ENCOUNTER — Telehealth: Payer: Self-pay | Admitting: Family Medicine

## 2021-04-12 NOTE — Telephone Encounter (Signed)
Oncotype form filled out and faxed.  ?

## 2021-04-12 NOTE — Telephone Encounter (Signed)
-----   Message from Hollice Espy, MD sent at 04/12/2021  9:12 AM EDT ----- ?Please help facilitate getting an Oncotype DX test done for this patient.  His biopsy specimen will be at [facility] urology. ?

## 2021-04-17 ENCOUNTER — Telehealth: Payer: Self-pay | Admitting: Urology

## 2021-04-17 ENCOUNTER — Ambulatory Visit
Admission: RE | Admit: 2021-04-17 | Discharge: 2021-04-17 | Disposition: A | Discharge status: Home / Self Care | Payer: BC Managed Care – PPO | Source: Ambulatory Visit | Attending: Urology | Admitting: Urology

## 2021-04-17 DIAGNOSIS — K383 Fistula of appendix: Secondary | ICD-10-CM

## 2021-04-17 DIAGNOSIS — R1900 Intra-abdominal and pelvic swelling, mass and lump, unspecified site: Secondary | ICD-10-CM | POA: Diagnosis present

## 2021-04-17 DIAGNOSIS — C61 Malignant neoplasm of prostate: Secondary | ICD-10-CM

## 2021-04-17 HISTORY — DX: Malignant (primary) neoplasm, unspecified: C80.1

## 2021-04-17 LAB — POCT I-STAT CREATININE: Creatinine, Ser: 1.3 mg/dL — ABNORMAL HIGH (ref 0.61–1.24)

## 2021-04-17 MED ORDER — IOHEXOL 300 MG/ML  SOLN
100.0000 mL | Freq: Once | INTRAMUSCULAR | Status: AC | PRN
  Administered 2021-04-17: 100 mL via INTRAVENOUS

## 2021-04-17 NOTE — Telephone Encounter (Signed)
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 ? ?I recommended the following: ? ?1.  Follow-up with me in about 2 weeks to to discuss management of incidental renal mass. ? ?2.  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 ?3.  PSMA PET scan to further evaluate pelvic bony lesions in the setting of known prostate cancer history.  Ideally, the study will be done prior to our follow-up. ? ?He understands all of the following. ?

## 2021-04-17 NOTE — Telephone Encounter (Signed)
Patient informed, voiced understanding. Scheduled follow up.  ?

## 2021-04-18 ENCOUNTER — Telehealth: Payer: Self-pay | Admitting: Urology

## 2021-04-18 NOTE — Telephone Encounter (Signed)
I spoke with Dr. Nash Mantis during a peer to peer discussion with his insurance to determine coverage for his PET (PSMA) scan and stated that we will have to order a bone scintigraph.  ?

## 2021-04-23 NOTE — Telephone Encounter (Signed)
Completed PA for Oncotype DX Genomic Prostate Score test  ?ref #[id number] ?

## 2021-04-25 ENCOUNTER — Other Ambulatory Visit: Payer: Self-pay | Admitting: Urology

## 2021-04-25 NOTE — Telephone Encounter (Signed)
Oncotype results are back and scanned in chart. ?

## 2021-04-26 ENCOUNTER — Other Ambulatory Visit: Payer: Self-pay | Admitting: *Deleted

## 2021-04-26 DIAGNOSIS — C61 Malignant neoplasm of prostate: Secondary | ICD-10-CM

## 2021-04-26 NOTE — Addendum Note (Signed)
Addended by: Verlene Mayer A on: 04/26/2021 09:54 AM ? ? Modules accepted: Orders ? ?

## 2021-05-02 ENCOUNTER — Inpatient Hospital Stay: Payer: BC Managed Care – PPO

## 2021-05-02 ENCOUNTER — Encounter: Payer: Self-pay | Admitting: Surgery

## 2021-05-02 ENCOUNTER — Encounter: Payer: Self-pay | Admitting: Licensed Clinical Social Worker

## 2021-05-02 ENCOUNTER — Ambulatory Visit (INDEPENDENT_AMBULATORY_CARE_PROVIDER_SITE_OTHER): Payer: BC Managed Care – PPO | Admitting: Surgery

## 2021-05-02 ENCOUNTER — Ambulatory Visit: Payer: BC Managed Care – PPO | Admitting: Urology

## 2021-05-02 ENCOUNTER — Inpatient Hospital Stay: Payer: BC Managed Care – PPO | Attending: Oncology | Admitting: Licensed Clinical Social Worker

## 2021-05-02 VITALS — BP 150/90 | HR 72 | Temp 98.3°F | Ht 70.5 in | Wt 232.0 lb

## 2021-05-02 DIAGNOSIS — I10 Essential (primary) hypertension: Secondary | ICD-10-CM | POA: Diagnosis not present

## 2021-05-02 DIAGNOSIS — C61 Malignant neoplasm of prostate: Secondary | ICD-10-CM

## 2021-05-02 DIAGNOSIS — K388 Other specified diseases of appendix: Secondary | ICD-10-CM

## 2021-05-02 DIAGNOSIS — K383 Fistula of appendix: Secondary | ICD-10-CM

## 2021-05-02 DIAGNOSIS — Z803 Family history of malignant neoplasm of breast: Secondary | ICD-10-CM | POA: Diagnosis not present

## 2021-05-02 DIAGNOSIS — Z8042 Family history of malignant neoplasm of prostate: Secondary | ICD-10-CM

## 2021-05-02 NOTE — Progress Notes (Signed)
?05/02/2021 ? ?Reason for Visit:  Appendiceal mass and fistula ? ?Requesting Provider:  Hollice Espy, MD ? ?History of Present Illness: ?Patrick Pittman is a 61 y.o. male presenting for evaluation of possible appendiceal mass versus fistula.  The patient is a history of prostate cancer and is being followed by Dr. Erlene Quan.  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  Given this finding, he had a CT scan of the abdomen pelvis on 04/17/2021 and this confirmed the finding in the right lower quadrant of a 4.8 x 3.3 x 1.8 cm ovoid density in the body of the appendix.  This spans over to the margin of the sigmoid colon and there is concern for potential tubular connection between the 2 structures.  However, the imaging study also found a 5.4 x 4.6 x 4.7 cm solid enhancing mass of the left kidney concerning for renal cell carcinoma. ? ?The patient has a follow-up appoint with Dr. Erlene Quan next week to discuss the imaging findings with regards to his prostate cancer and newly diagnosed left renal cancer.  He presents today to discuss the findings with respect to his appendix.  The patient denies any family history of colon cancer but does report family history of prostate cancer.  He denies any weight loss or weight gain recently, denies any abdominal pain, nausea, vomiting, constipation, diarrhea, blood in his stool.  The patient reports that he had a colonoscopy in 2015 which was negative, however unable to view any records of this. ? ?Past Medical History: ?Past Medical History:  ?Diagnosis Date  ? Cancer [facility])   ? Hypertension   ? No blood products   ? Jehovah's witness  ?  ? ?Past Surgical History: ?Past Surgical History:  ?Procedure  Laterality Date  ? COLONOSCOPY    ? NO PAST SURGERIES    ? RESECTION DISTAL CLAVICAL Right 07/26/2015  ? Procedure: Open excision of right distal clavicle.;  Surgeon: Corky Mull, MD;  Location: [place];  Service: Orthopedics;  Laterality: Right;  ? ? ?Home Medications: ?Prior to Admission medications   ?Medication Sig Start Date End Date Taking? Authorizing Provider  ?sildenafil (REVATIO) 20 MG tablet Take 20 mg by mouth. Take 2-5 tablets by mouth as needed   Yes [provider]  ?valsartan-hydrochlorothiazide (DIOVAN-HCT) 80-12.5 MG tablet Take 1 tablet by mouth daily. 02/08/21  Yes [provider]  ? ? ?Allergies: ?No Known Allergies ? ?Social History: ? reports that he has never smoked. He has never been exposed to tobacco smoke. He has never used smokeless tobacco. He reports that he does not currently use alcohol. He reports that he does not use drugs.  ? ?Family History: ?Family History  ?Problem Relation Age of Onset  ? Breast cancer Mother   ? Prostate cancer Father   ? Prostate cancer Brother   ? Prostate cancer Brother 53  ?     metastatic  ? Prostate cancer Brother   ? Prostate cancer Brother   ? Prostate cancer Nephew 39  ? ? ?Review of Systems: ?Review of Systems  ?Constitutional:  Negative for chills, fever and weight loss.  ?HENT:  Negative for hearing loss.   ?Respiratory:  Negative for shortness of breath.   ?  Cardiovascular:  Negative for chest pain.  ?Gastrointestinal:  Negative for abdominal pain, blood in stool, constipation, diarrhea, nausea and vomiting.  ?Genitourinary:  Negative for dysuria.  ?Musculoskeletal:  Negative for myalgias.  ?Skin:  Negative for rash.  ?Neurological:  Negative for dizziness.  ?Psychiatric/Behavioral:  Negative for depression.   ? ?Physical Exam ?BP (!) 150/90   Pulse 72   Temp 98.3 ?F (36.8 ?C)   Ht 5' 10.5" (1.791 m)   Wt 232 lb (105.2 kg)   SpO2 96%   BMI 32.82 kg/m?  ?CONSTITUTIONAL: No acute distress, well-nourished ?HEENT:   Normocephalic, atraumatic, extraocular motion intact. ?NECK: Trachea is midline, and there is no jugular venous distension.  ?RESPIRATORY:  Lungs are clear, and breath sounds are equal bilaterally. Normal respiratory effort without pathologic use of accessory muscles. ?CARDIOVASCULAR: Heart is regular without murmurs, gallops, or rubs. ?GI: The abdomen is soft, nondistended, nontender to palpation. There were no palpable masses.  ?MUSCULOSKELETAL:  Normal muscle strength and tone in all four extremities.  No peripheral edema or cyanosis. ?SKIN: Skin turgor is normal. There are no pathologic skin lesions.  ?NEUROLOGIC:  Motor and sensation is grossly normal.  Cranial nerves are grossly intact. ?PSYCH:  Alert and oriented to person, place and time. Affect is normal. ? ?Laboratory Analysis: ?No results found for this or any previous visit (from the past 24 hour(s)). ? ?Imaging: ?CT abdomen/pelvis on 04/17/2021: ?IMPRESSION: ?1. 5.4 cm solid enhancing mass of the central left kidney, likely a ?renal cell carcinoma. No associated tumor thrombus in the left renal ?vein; no associated adenopathy identified. ?2. Soft tissue mass of concern along the appendix measures 4.8 by ?3.3 by 1.8 cm, enhances, and spans between the appendix in the ?sigmoid colon. Possibilities include chronic granulomatous tissue ?along a fistulous connection between the appendix and sigmoid colon, ?versus an indolent neuroendocrine or lymphomatous tumor of the ?appendix. Of note, this lesion is stable from 07/02/2008 although at ?that time there was a severe pancolitis and this lesion was more ?reminiscent of reactive adenopathy back in 2010. ?3. Several small sclerotic lesions of the bony pelvis and lumbar ?spine, some new from 2010. While these may reflect benign bone ?islands, in light of the patient's prostate cancer, I would ?recommend a nuclear medicine bone scan in order to ensure the lack ?of scintigraphically active lesions. ?4. Marked  prostatomegaly. ?5. Small type 1 hiatal hernia. ?6. Lower lumbar impingement due to spondylosis and degenerative disc disease. ? ?MRI prostate on 03/01/2021: ?IMPRESSION: ?1. Anterior tumor in the LEFT paramidline mid to apical transitional ?zone bulging the capsule, suspicious for extracapsular extension of ?this PIRADS category 5 lesion. ?2. No signs of adenopathy or bone lesion in the pelvis. Degree of ?restricted diffusion is concerning for high-risk disease. ?3. 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 Suggest ?dedicated CT imaging for further evaluation or correlation with any ?recent cross-sectional imaging if available. ? ? ?Assessment and Plan: ?This is a 61 y.o. 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 ? ?- Discussed with the patient the findings on both his prostate MRI as well as the CT scan of his abdomen and pelvis.  His appoint with Dr. Erlene Quan is next week but I briefly discussed with him the findings with regards also to his left kidney.  With regards to the appendiceal mass and possible fistula to the  sigmoid colon, it is unclear to me looking at the images whether there is truly a fistula communication to the sigmoid versus if I am seeing at the very tip of the appendix.  As such, we need to evaluate the bowel first and will order referral to gastroenterology for colonoscopy.  Discussed with the patient that colonoscopy may be able to assess if there is a fistulous communication to the sigmoid colon of there is a potential mass in that area.  It would not be able to get into the appendix for a biopsy of the mass but at least will help [place] with the surgical planning to see if any surgery may be needed for the sigmoid colon itself or if there are any other areas of concern within his colon.  Discussed with him the  possibilities of having to do an appendectomy and also a partial colectomy versus appendectomy alone.  Then depending on the pathology findings of the appendectomy, he may need also a completion right colectomy. ?- Once the

## 2021-05-02 NOTE — Progress Notes (Signed)
REFERRING PROVIDER: ?Hollice Espy, MD ?[address] ?[place],  [place] [address] ? ?PRIMARY PROVIDER:  ?[place] ? ?PRIMARY REASON FOR VISIT:  ?1. Prostate cancer ([place])   ?2. Family history of prostate cancer   ? ? ? ?HISTORY OF PRESENT ILLNESS:   ?Patrick Pittman, a 61 y.o. male, was seen for a [place] cancer genetics consultation at the request of Dr. Erlene Quan due to a personal and family history of prostate cancer.  Patrick Pittman presents to clinic today to discuss the possibility of a hereditary predisposition to cancer, genetic testing, and to further clarify his future cancer risks, as well as potential cancer risks for family members.  ? ?In 2021, Patrick Pittman had a prostate biopsy which showed adenocarcinoma Gleason 3+3 and elected for active surveillance. He recently had another biopsy and MRI which showed intermediate risk prostate cancer, treatment plan includes surgery and Oncotype.  ? ?CANCER HISTORY:  ?Oncology History  ? No history exists.  ? ? ?Past Medical History:  ?Diagnosis Date  ? Cancer [facility])   ? Hypertension   ? No blood products   ? Jehovah's witness  ? ? ?Past Surgical History:  ?Procedure Laterality Date  ? COLONOSCOPY    ? NO PAST SURGERIES    ? RESECTION DISTAL CLAVICAL Right 07/26/2015  ? Procedure: Open excision of right distal clavicle.;  Surgeon: Corky Mull, MD;  Location: [place];  Service: Orthopedics;  Laterality: Right;  ? ? ?Social History  ? ?Socioeconomic History  ? Marital status: Married  ?  Spouse name: Not on file  ? Number of children: Not on file  ? Years of education: Not on file  ? Highest education level: Not on file  ?Occupational History  ? Not on file  ?Tobacco Use  ? Smoking status: Never  ? Smokeless tobacco: Never  ?Substance and Sexual Activity  ? Alcohol use: Not Currently  ? Drug use: Not on file  ? Sexual activity: Not on file  ?Other Topics Concern  ? Not on file  ?Social History Narrative  ? ** Merged History  Encounter **  ?    ? ?Social Determinants of Health  ? ?Financial Resource Strain: Not on file  ?Food Insecurity: Not on file  ?Transportation Needs: Not on file  ?Physical Activity: Not on file  ?Stress: Not on file  ?Social Connections: Not on file  ?  ? ?FAMILY HISTORY:  ?We obtained a detailed, 4-generation family history.  Significant diagnoses are listed below: ?Family History  ?Problem Relation Age of Onset  ? Prostate cancer Father   ? Prostate cancer Brother   ? Prostate cancer Brother   ? Prostate cancer Brother   ? Prostate cancer Brother   ? Prostate cancer Brother   ? ?Patrick Pittman has 1 son, 7, and 1 daughter, 4. He has 4 brothers and 1 sister. All of his brothers have been diagnosed with prostate cancer. One brother has metastatic prostate cancer. His sister's son also has had prostate cancer in his 52s, treated with surgery. Patient believes his siblings may have had positive genetic testing.  ? ?Patrick Pittman's father also had prostate cancer in his 72s and died at 26. Patient had 13 paternal aunts/uncles and thinks there were cancer diagnoses from them, types unknown. Paternal grandparents passed in their 52s.  ? ?Patrick Pittman's mother had breast cancer in her 84s-60s and passed at 47. She did not have siblings. Maternal grandparents passed in their 68s.  ? ?  Patrick Pittman is aware of previous family history of genetic testing for hereditary cancer risks. There is no reported Ashkenazi Jewish ancestry. There is no known consanguinity. ? ? ? ?GENETIC COUNSELING ASSESSMENT: Patrick Pittman is a 61 y.o. male with a personal and family history of prostate cancer which is somewhat suggestive of a hereditary cancer syndrome and predisposition to cancer. We, therefore, discussed and recommended the following at today's visit.  ? ?DISCUSSION: We discussed that approximately 10% of prostate cancer is hereditary. Most cases of hereditary prostate cancer are associated with BRCA1/BRCA2 genes, although there are other genes  associated with hereditary prostate cancer as well including HOXB13. Cancers and risks are gene specific. We discussed that testing is beneficial for several reasons including knowing about cancer risks, identifying potential screening and risk-reduction options that may be appropriate, and to understand if other family members could be at risk for cancer and allow them to undergo genetic testing.  ? ?We reviewed the characteristics, features and inheritance patterns of hereditary cancer syndromes. We also discussed genetic testing, including the appropriate family members to test, the process of testing, insurance coverage and turn-around-time for results. We discussed the implications of a negative, positive and/or variant of uncertain significant result. We recommended Patrick Pittman pursue genetic testing for the [employer] CancerNext-Expanded+RNA gene panel.  ? ?The CancerNext-Expanded + RNAinsight gene panel offered by [employer] and includes sequencing and rearrangement analysis for the following 77 genes: IP, ALK, APC*, ATM*, AXIN2, BAP1, BARD1, BLM, BMPR1A, BRCA1*, BRCA2*, BRIP1*, CDC73, CDH1*,CDK4, CDKN1B, CDKN2A, CHEK2*, CTNNA1, DICER1, FANCC, FH, FLCN, GALNT12, KIF1B, LZTR1, MAX, MEN1, MET, MLH1*, MSH2*, MSH3, MSH6*, MUTYH*, NBN, NF1*, NF2, NTHL1, PALB2*, PHOX2B, PMS2*, POT1, PRKAR1A, PTCH1, PTEN*, RAD51C*, RAD51D*,RB1, RECQL, RET, SDHA, SDHAF2, SDHB, SDHC, SDHD, SMAD4, SMARCA4, SMARCB1, SMARCE1, STK11, SUFU, TMEM127, TP53*,TSC1, TSC2, VHL and XRCC2 (sequencing and deletion/duplication); EGFR, EGLN1, HOXB13, KIT, MITF, PDGFRA, POLD1 and POLE (sequencing only); EPCAM and GREM1 (deletion/duplication only). ? ?Based on Patrick Pittman personal and family history of cancer, he meets medical criteria for genetic testing. Despite that he meets criteria, he may still have an out of pocket cost. We discussed that if his out of pocket cost for testing is over $100, the laboratory will call and confirm whether he wants to  proceed with testing.  If the out of pocket cost of testing is less than $100 he will be billed by the genetic testing laboratory.  ? ?PLAN: After considering the risks, benefits, and limitations, Patrick Pittman provided informed consent to pursue genetic testing and the blood sample was sent to [facility] for analysis of the CancerNext-Expanded+RNA panel. Results should be available within approximately 2-3 weeks' time, at which point they will be disclosed by telephone to Patrick Pittman, as will any additional recommendations warranted by these results. Patrick Pittman will receive a summary of his genetic counseling visit and a copy of his results once available. This information will also be available in Epic.  ? ?Patrick Pittman's questions were answered to his satisfaction today. Our contact information was provided should additional questions or concerns arise. Thank you for the referral and allowing [place] to share in the care of your patient.  ? ?Faith Rogue, MS, LCGC ?Genetic Counselor ?[email or web address] ?Phone: [phone] ? ?The patient was seen for a total of 25 minutes in face-to-face genetic counseling.  Dr. Grayland Ormond was available for discussion regarding this case.  ? ?_______________________________________________________________________ ?For Office Staff:  ?Number of people involved in session: 1 ?Was an Intern/ student involved with case: no ? ?

## 2021-05-02 NOTE — Patient Instructions (Addendum)
We have sent a referral to [facility] so you can get a Colonoscopy to better look at the area around the appendix to determine what surgery you will need. They will call you to schedule this.  ? ?We will have you follow up here after we get your colonoscopy results.  ? ? ?Call us once you have your Colonoscopy scheduled so we can schedule a follow up with Dr Hampton Abbot.  ? ? ?

## 2021-05-03 ENCOUNTER — Encounter
Admission: RE | Admit: 2021-05-03 | Discharge: 2021-05-03 | Disposition: A | Discharge status: Home / Self Care | Payer: BC Managed Care – PPO | Source: Ambulatory Visit | Attending: Urology | Admitting: Urology

## 2021-05-03 ENCOUNTER — Telehealth: Payer: Self-pay

## 2021-05-03 DIAGNOSIS — C61 Malignant neoplasm of prostate: Secondary | ICD-10-CM | POA: Diagnosis present

## 2021-05-03 MED ORDER — TECHNETIUM TC 99M MEDRONATE IV KIT
20.0000 | PACK | Freq: Once | INTRAVENOUS | Status: AC | PRN
  Administered 2021-05-03: 19.86 via INTRAVENOUS

## 2021-05-03 NOTE — Telephone Encounter (Signed)
Called no answer voicemail full ?

## 2021-05-07 ENCOUNTER — Telehealth: Payer: Self-pay

## 2021-05-07 NOTE — Telephone Encounter (Signed)
Called no answer voicemail full sent sms message and sent letter  ?

## 2021-05-08 NOTE — Telephone Encounter (Signed)
Pt left message to get a return call for colonoscopy appointment ?

## 2021-05-09 ENCOUNTER — Other Ambulatory Visit: Payer: BC Managed Care – PPO

## 2021-05-09 ENCOUNTER — Encounter: Payer: Self-pay | Admitting: Urology

## 2021-05-09 ENCOUNTER — Ambulatory Visit (INDEPENDENT_AMBULATORY_CARE_PROVIDER_SITE_OTHER): Payer: BC Managed Care – PPO | Admitting: Urology

## 2021-05-09 ENCOUNTER — Ambulatory Visit: Payer: BC Managed Care – PPO

## 2021-05-09 VITALS — BP 169/101 | HR 76 | Ht 70.5 in | Wt 232.0 lb

## 2021-05-09 DIAGNOSIS — M899 Disorder of bone, unspecified: Secondary | ICD-10-CM | POA: Diagnosis not present

## 2021-05-09 DIAGNOSIS — N2889 Other specified disorders of kidney and ureter: Secondary | ICD-10-CM

## 2021-05-09 DIAGNOSIS — C61 Malignant neoplasm of prostate: Secondary | ICD-10-CM
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
## 2021-05-16 ENCOUNTER — Other Ambulatory Visit: Payer: Self-pay

## 2021-05-16 DIAGNOSIS — Z1211 Encounter for screening for malignant neoplasm of colon: Secondary | ICD-10-CM

## 2021-05-16 NOTE — Progress Notes (Signed)
Gastroenterology Pre-Procedure Review ? ?Request Date: 05/23/2021 ?Requesting Physician: Dr. Vicente Males ? ?PATIENT REVIEW QUESTIONS: The patient responded to the following health history questions as indicated:   ? ?1. Are you having any GI issues? no ?2. Do you have a personal history of Polyps? no ?3. Do you have a family history of Colon Cancer or Polyps? no ?4. Diabetes Mellitus? no ?5. Joint replacements in the past 12 months?no ?6. Major health problems in the past 3 months?yes (has cancer ) ?7. Any artificial heart valves, MVP, or defibrillator?no ?   ?MEDICATIONS & ALLERGIES:    ?Patient reports the following regarding taking any anticoagulation/antiplatelet therapy:   ?Plavix, Coumadin, Eliquis, Xarelto, Lovenox, Pradaxa, Brilinta, or Effient? no ?Aspirin? no ? ?Patient confirms/reports the following medications:  ?Current Outpatient Medications  ?Medication Sig Dispense Refill  ? sildenafil (REVATIO) 20 MG tablet Take 20 mg by mouth. Take 2-5 tablets by mouth as needed    ? valsartan-hydrochlorothiazide (DIOVAN-HCT) 80-12.5 MG tablet Take 1 tablet by mouth daily.    ? ?No current facility-administered medications for this visit.  ? ? ?Patient confirms/reports the following allergies:  ?No Known Allergies ? ?No orders of the defined types were placed in this encounter. ? ? ?AUTHORIZATION INFORMATION ?Primary Insurance: ?1D#: ?Group #: ? ?Secondary Insurance: ?1D#: ?Group #: ? ?SCHEDULE INFORMATION: ?Date: 05/23/2021 ?Time: ?Location: [facility] ?

## 2021-05-16 NOTE — Progress Notes (Signed)
Gave a clinpiq sample ?

## 2021-05-21 ENCOUNTER — Other Ambulatory Visit: Payer: Self-pay

## 2021-05-21 ENCOUNTER — Encounter: Payer: Self-pay | Admitting: Gastroenterology

## 2021-05-22 ENCOUNTER — Telehealth: Payer: Self-pay | Admitting: Licensed Clinical Social Worker

## 2021-05-23 ENCOUNTER — Ambulatory Visit: Payer: BC Managed Care – PPO | Admitting: Certified Registered"

## 2021-05-23 ENCOUNTER — Ambulatory Visit
Admission: RE | Admit: 2021-05-23 | Discharge: 2021-05-23 | Disposition: A | Discharge status: Home / Self Care | Payer: BC Managed Care – PPO | Attending: Gastroenterology | Admitting: Gastroenterology

## 2021-05-23 ENCOUNTER — Encounter
Admission: RE | Disposition: A | Discharge status: Home / Self Care | Payer: Self-pay | Source: Home / Self Care | Attending: Gastroenterology

## 2021-05-23 ENCOUNTER — Encounter: Payer: Self-pay | Admitting: Gastroenterology

## 2021-05-23 DIAGNOSIS — R933 Abnormal findings on diagnostic imaging of other parts of digestive tract: Secondary | ICD-10-CM | POA: Insufficient documentation

## 2021-05-23 DIAGNOSIS — R935 Abnormal findings on diagnostic imaging of other abdominal regions, including retroperitoneum: Secondary | ICD-10-CM

## 2021-05-23 DIAGNOSIS — Z1211 Encounter for screening for malignant neoplasm of colon: Secondary | ICD-10-CM

## 2021-05-23 DIAGNOSIS — D122 Benign neoplasm of ascending colon: Secondary | ICD-10-CM | POA: Diagnosis not present

## 2021-05-23 DIAGNOSIS — K621 Rectal polyp: Secondary | ICD-10-CM | POA: Insufficient documentation

## 2021-05-23 DIAGNOSIS — D120 Benign neoplasm of cecum: Secondary | ICD-10-CM | POA: Diagnosis not present

## 2021-05-23 DIAGNOSIS — K635 Polyp of colon: Secondary | ICD-10-CM | POA: Diagnosis not present

## 2021-05-23 HISTORY — PX: COLONOSCOPY WITH PROPOFOL: SHX5780

## 2021-05-23 SURGERY — COLONOSCOPY WITH PROPOFOL
Anesthesia: General

## 2021-05-23 MED ORDER — PROPOFOL 500 MG/50ML IV EMUL
INTRAVENOUS | Status: DC | PRN
  Administered 2021-05-23: 165 ug/kg/min via INTRAVENOUS

## 2021-05-23 MED ORDER — SODIUM CHLORIDE 0.9 % IV SOLN
INTRAVENOUS | Status: DC
  Administered 2021-05-23: 1000 mL via INTRAVENOUS

## 2021-05-23 MED ORDER — LIDOCAINE HCL (CARDIAC) PF 100 MG/5ML IV SOSY
PREFILLED_SYRINGE | INTRAVENOUS | Status: DC | PRN
  Administered 2021-05-23: 100 mg via INTRAVENOUS

## 2021-05-23 MED ORDER — PROPOFOL 10 MG/ML IV BOLUS
INTRAVENOUS | Status: DC | PRN
  Administered 2021-05-23: 70 mg via INTRAVENOUS
  Administered 2021-05-23: 30 mg via INTRAVENOUS

## 2021-05-23 NOTE — Op Note (Signed)
[facility] ?Gastroenterology ?Patient Name: Chaddrick Brue ?Procedure Date: 05/23/2021 11:38 AM ?MRN: [id number] ?Account #: [id number] ?Date of Birth: May 05, 1960 ?Admit Type: Outpatient ?Age: 61 ?Room: [facility] ENDO ROOM 3 ?Gender: Male ?Note Status: Finalized ?Instrument Name: Peds Colonoscope [id number] ?Procedure:             Colonoscopy ?Indications:           Abnormal CT of the GI tract ?Providers:             [name] MD, MD ?Referring MD:          Theotis Burrow (Referring MD) ?Medicines:             Monitored Anesthesia Care ?Complications:         No immediate complications. ?Procedure:             Pre-Anesthesia Assessment: ?                       - Prior to the procedure, a History and Physical was  ?                       performed, and patient medications, allergies and  ?                       sensitivities were reviewed. The patient's tolerance  ?                       of previous anesthesia was reviewed. ?                       - The risks and benefits of the procedure and the  ?                       sedation options and risks were discussed with the  ?                       patient. All questions were answered and informed  ?                       consent was obtained. ?                       - ASA Grade Assessment: III - A patient with severe  ?                       systemic disease. ?                       After obtaining informed consent, the colonoscope was  ?                       passed under direct vision. Throughout the procedure,  ?                       the patient's blood pressure, pulse, and oxygen  ?                       saturations were monitored continuously. The  ?                       Colonoscope  was introduced through the anus and  ?                       advanced to the the cecum, identified by the  ?                       appendiceal orifice. The colonoscopy was performed  ?                       with ease. The patient tolerated the procedure well.  ?                        The quality of the bowel preparation was fair. ?Findings: ?     The perianal and digital rectal examinations were normal. ?     Four sessile polyps were found in the ascending colon. The polyps were 5  ?     to 6 mm in size. These polyps were removed with a cold snare. Resection  ?     and retrieval were complete. ?     Three sessile polyps were found in the cecum. The polyps were 4 to 6 mm  ?     in size. These polyps were removed with a cold snare. Resection and  ?     retrieval were complete. ?     A 3 mm polyp was found in the rectum. The polyp was sessile. The polyp  ?     was removed with a jumbo cold forceps. Resection and retrieval were  ?     complete. ?     The exam was otherwise without abnormality on direct and retroflexion  ?     views. ?     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 ?Impression:            - Preparation of the colon was fair. ?                       - Four 5 to 6 mm polyps in the ascending colon,  ?                       removed with a cold snare. Resected and retrieved. ?                       - Three 4 to 6 mm polyps in the cecum, removed with a  ?                       cold snare. Resected and retrieved. ?                       - One 3 mm polyp in the rectum, removed with a jumbo  ?                       cold forceps. Resected and retrieved. ?                       - The examination was otherwise normal on direct and  ?                       retroflexion views. ?Recommendation:        -  Discharge patient to home (with escort). ?                       - Resume previous diet. ?                       - Continue present medications. ?                       - Await pathology results. ?                       - Repeat colonoscopy in 3 years for surveillance. ?Procedure Code(s):     --- Professional --- ?                       [phone], Colonoscopy, flexible; with removal of  ?                       tumor(s), polyp(s), or other lesion(s) by snare  ?                        technique ?                       [id number], 59, Colonoscopy, flexible; with biopsy, single  ?                       or multiple ?Diagnosis Code(s):     --- Professional --- ?                       K63.5, Polyp of colon ?                       K62.1, Rectal polyp ?                       R93.3, Abnormal findings on diagnostic imaging of  ?                       other parts of digestive tract ?CPT copyright 2019 American Medical Association. All rights reserved. ?The codes documented in this report are preliminary and upon coder review may  ?be revised to meet current compliance requirements. ?[name], MD ?[name] MD, MD ?05/23/2021 12:18:29 PM ?This report has been signed electronically. ?Number of Addenda: 0 ?Note Initiated On: 05/23/2021 11:38 AM ?Scope Withdrawal Time: 0 hours 19 minutes 8 seconds  ?Total Procedure Duration: 0 hours 27 minutes 25 seconds  ?Estimated Blood Loss:  Estimated blood loss: none. ?     [facility] ?

## 2021-05-23 NOTE — H&P (Signed)
? ? ? ?Patrick Bellows, MD ?[address], [place], [place], [place], [address] ?[address], [place], [place], [place], [address] ?Phone: [phone]  ?Fax: [phone] ? ?Primary Care Physician:  Theotis Burrow, MD ? ? ?Pre-Procedure History & Physical: ?HPI:  Patrick REASONS is a 61 y.o. male is here for an colonoscopy. ?  ?Past Medical History:  ?Diagnosis Date  ? Cancer [facility])   ? Hypertension   ? No blood products   ? Jehovah's witness  ? ? ?Past Surgical History:  ?Procedure Laterality Date  ? COLONOSCOPY    ? RESECTION DISTAL CLAVICAL Right 07/26/2015  ? Procedure: Open excision of right distal clavicle.;  Surgeon: Corky Mull, MD;  Location: [facility];  Service: Orthopedics;  Laterality: Right;  ? ? ?Prior to Admission medications   ?Medication Sig Start Date End Date Taking? Authorizing Provider  ?valsartan-hydrochlorothiazide (DIOVAN-HCT) 80-12.5 MG tablet Take 1 tablet by mouth daily. 02/08/21  Yes [provider]  ?sildenafil (REVATIO) 20 MG tablet Take 20 mg by mouth. Take 2-5 tablets by mouth as needed    [provider]  ? ? ?Allergies as of 05/16/2021  ? (No Known Allergies)  ? ? ?Family History  ?Problem Relation Age of Onset  ? Breast cancer Mother   ? Prostate cancer Father   ? Prostate cancer Brother   ? Prostate cancer Brother 38  ?     metastatic  ? Prostate cancer Brother   ? Prostate cancer Brother   ? Prostate cancer Nephew 39  ? ? ?Social History  ? ?Socioeconomic History  ? Marital status: Married  ?  Spouse name: Not on file  ? Number of children: Not on file  ? Years of education: Not on file  ? Highest education level: Not on file  ?Occupational History  ? Not on file  ?Tobacco Use  ? Smoking status: Never  ?  Passive exposure: Never  ? Smokeless tobacco: Never  ?Vaping Use  ? Vaping Use: Never used  ?Substance and Sexual Activity  ? Alcohol use: Not Currently  ? Drug use: Never  ? Sexual activity: Not on file  ?Other Topics Concern  ? Not on file   ?Social History Narrative  ? ** Merged History Encounter **  ?    ? ?Social Determinants of Health  ? ?Financial Resource Strain: Not on file  ?Food Insecurity: Not on file  ?Transportation Needs: Not on file  ?Physical Activity: Not on file  ?Stress: Not on file  ?Social Connections: Not on file  ?Intimate Partner Violence: Not on file  ? ? ?Review of Systems: ?See HPI, otherwise negative ROS ? ?Physical Exam: ?BP 136/80   Pulse 65   Temp 97.7 ?F (36.5 ?C) (Temporal)   Resp 18   Ht '5\' 10"'$  (1.778 m)   Wt 105.4 kg   SpO2 99%   BMI 33.36 kg/m?  ?General:   Alert,  pleasant and cooperative in NAD ?Head:  Normocephalic and atraumatic. ?Neck:  Supple; no masses or thyromegaly. ?Lungs:  Clear throughout to auscultation, normal respiratory effort.    ?Heart:  +S1, +S2, Regular rate and rhythm, No edema. ?Abdomen:  Soft, nontender and nondistended. Normal bowel sounds, without guarding, and without rebound.   ?Neurologic:  Alert and  oriented x4;  grossly normal neurologically. ? ?Impression/Plan: ?Patrick Pittman is here for an colonoscopy to be performed for abnormal ct scan of the abdomen   ?Risks, benefits, limitations, and alternatives regarding  colonoscopy  have been reviewed with the patient.  Questions have been answered.  All parties agreeable. ? ? ?Patrick Bellows, MD  05/23/2021, 11:33 AM ? ?

## 2021-05-23 NOTE — Anesthesia Procedure Notes (Signed)
Procedure Name: General with mask airway ?Date/Time: 05/23/2021 11:50 AM ?Performed by: Kelton Pillar, CRNA ?Pre-anesthesia Checklist: Patient identified, Emergency Drugs available, Suction available and Patient being monitored ?Patient Re-evaluated:Patient Re-evaluated prior to induction ?Oxygen Delivery Method: Simple face mask ?Induction Type: IV induction ?Placement Confirmation: positive ETCO2, CO2 detector and breath sounds checked- equal and bilateral ?Dental Injury: Teeth and Oropharynx as per pre-operative assessment  ? ? ? ? ?

## 2021-05-23 NOTE — Transfer of Care (Signed)
Immediate Anesthesia Transfer of Care Note ? ?Patient: Patrick Pittman ? ?Procedure(s) Performed: COLONOSCOPY WITH PROPOFOL ? ?Patient Location: Endoscopy Unit ? ?Anesthesia Type:General ? ?Level of Consciousness: alert , drowsy and patient cooperative ? ?Airway & Oxygen Therapy: Patient Spontanous Breathing and Patient connected to face mask oxygen ? ?Post-op Assessment: Report given to RN and Post -op Vital signs reviewed and stable ? ?Post vital signs: Reviewed and stable ? ?Last Vitals:  ?Vitals Value Taken Time  ?BP 112/65 05/23/21 1217  ?Temp 36.4 ?C 05/23/21 1217  ?Pulse 69 05/23/21 1217  ?Resp 11 05/23/21 1217  ?SpO2 100 % 05/23/21 1217  ?Vitals shown include unvalidated device data. ? ?Last Pain:  ?Vitals:  ? 05/23/21 1217  ?TempSrc: Temporal  ?PainSc:   ?   ? ?  ? ?Complications: No notable events documented. ?

## 2021-05-23 NOTE — Anesthesia Preprocedure Evaluation (Signed)
Anesthesia Evaluation  ?Patient identified by MRN, date of birth, ID band ?Patient awake ? ? ? ?Reviewed: ?Allergy & Precautions, H&P , NPO status , Patient's Chart, lab work & pertinent test results, reviewed documented beta blocker date and time  ? ?Airway ?Mallampati: II ? ? ?Neck ROM: full ? ? ? Dental ? ?(+) Poor Dentition ?  ?Pulmonary ?neg pulmonary ROS,  ?  ?Pulmonary exam normal ? ? ? ? ? ? ? Cardiovascular ?Exercise Tolerance: Good ?hypertension, On Medications ?negative cardio ROS ?Normal cardiovascular exam ?Rhythm:regular Rate:Normal ? ? ?  ?Neuro/Psych ?negative neurological ROS ? negative psych ROS  ? GI/Hepatic ?negative GI ROS, Neg liver ROS,   ?Endo/Other  ?negative endocrine ROS ? Renal/GU ?negative Renal ROS  ?negative genitourinary ?  ?Musculoskeletal ? ? Abdominal ?  ?Peds ? Hematology ?negative hematology ROS ?(+)   ?Anesthesia Other Findings ?Past Medical History: ?No date: Cancer [facility]) ?No date: Hypertension ?No date: No blood products ?    Comment:  Jehovah's witness ?Past Surgical History: ?No date: COLONOSCOPY ?07/26/2015: RESECTION DISTAL CLAVICAL; Right ?    Comment:  Procedure: Open excision of right distal clavicle.;   ?             Surgeon: Corky Mull, MD;  Location: [place]  ?             [facility];  Service: Orthopedics;  Laterality: Right; ? ? Reproductive/Obstetrics ?negative OB ROS ? ?  ? ? ? ? ? ? ? ? ? ? ? ? ? ?  ?  ? ? ? ? ? ? ? ? ?Anesthesia Physical ?Anesthesia Plan ? ?ASA: 2 ? ?Anesthesia Plan: General  ? ?Post-op Pain Management:   ? ?Induction:  ? ?PONV Risk Score and Plan:  ? ?Airway Management Planned:  ? ?Additional Equipment:  ? ?Intra-op Plan:  ? ?Post-operative Plan:  ? ?Informed Consent: I have reviewed the patients History and Physical, chart, labs and discussed the procedure including the risks, benefits and alternatives for the proposed anesthesia with the patient or authorized representative who has indicated his/her  understanding and acceptance.  ? ? ? ?Dental Advisory Given ? ?Plan Discussed with: CRNA ? ?Anesthesia Plan Comments:   ? ? ? ? ? ? ?Anesthesia Quick Evaluation ? ?

## 2021-05-23 NOTE — Anesthesia Postprocedure Evaluation (Signed)
Anesthesia Post Note ? ?Patient: Patrick Pittman ? ?Procedure(s) Performed: COLONOSCOPY WITH PROPOFOL ? ?Patient location during evaluation: PACU ?Anesthesia Type: General ?Level of consciousness: awake and alert ?Pain management: pain level controlled ?Vital Signs Assessment: post-procedure vital signs reviewed and stable ?Respiratory status: spontaneous breathing, nonlabored ventilation, respiratory function stable and patient connected to nasal cannula oxygen ?Cardiovascular status: blood pressure returned to baseline and stable ?Postop Assessment: no apparent nausea or vomiting ?Anesthetic complications: no ? ? ?No notable events documented. ? ? ?Last Vitals:  ?Vitals:  ? 05/23/21 1227 05/23/21 1247  ?BP: 133/79 (!) 128/92  ?Pulse:    ?Resp:    ?Temp:    ?SpO2:    ?  ?Last Pain:  ?Vitals:  ? 05/23/21 1247  ?TempSrc:   ?PainSc: 0-No pain  ? ? ?  ?  ?  ?  ?  ?  ? ?Molli Barrows ? ? ? ? ?

## 2021-05-25 ENCOUNTER — Encounter: Payer: Self-pay | Admitting: Gastroenterology

## 2021-05-25 LAB — SURGICAL PATHOLOGY

## 2021-05-28 ENCOUNTER — Encounter: Payer: Self-pay | Admitting: Gastroenterology

## 2021-05-28 ENCOUNTER — Encounter: Payer: Self-pay | Admitting: Licensed Clinical Social Worker

## 2021-05-28 ENCOUNTER — Ambulatory Visit: Payer: Self-pay | Admitting: Licensed Clinical Social Worker

## 2021-05-28 DIAGNOSIS — Z1211 Encounter for screening for malignant neoplasm of colon: Secondary | ICD-10-CM | POA: Insufficient documentation

## 2021-05-28 DIAGNOSIS — Z1379 Encounter for other screening for genetic and chromosomal anomalies: Secondary | ICD-10-CM | POA: Insufficient documentation

## 2021-05-28 NOTE — Progress Notes (Signed)
HPI:  Patrick Pittman was previously seen in the [place] clinic due to a personal and family history of cancer and concerns regarding a hereditary predisposition to cancer. Please refer to our prior cancer genetics clinic note for more information regarding our discussion, assessment and recommendations, at the time. Patrick Pittman recent genetic test results were disclosed to him, as were recommendations warranted by these results. These results and recommendations are discussed in more detail below.  CANCER HISTORY:  Oncology History   No history exists.    FAMILY HISTORY:  We obtained a detailed, 4-generation family history.  Significant diagnoses are listed below: Family History  Problem Relation Age of Onset   Breast cancer Mother    Prostate cancer Father    Prostate cancer Brother    Prostate cancer Brother 46       metastatic   Prostate cancer Brother    Prostate cancer Brother    Prostate cancer Nephew 65    Patrick Pittman has 1 son, 30, and 1 daughter, 4. He has 4 brothers and 1 sister. All of his brothers have been diagnosed with prostate cancer. One brother has metastatic prostate cancer. His sister's son also has had prostate cancer in his 24s, treated with surgery. Patient believes his siblings may have had positive genetic testing.    Patrick Pittman father also had prostate cancer in his 39s and died at 47. Patient had 13 paternal aunts/uncles and thinks there were cancer diagnoses from them, types unknown. Paternal grandparents passed in their 27s.    Patrick Pittman mother had breast cancer in her 52s-60s and passed at 3. She did not have siblings. Maternal grandparents passed in their 109s.    Patrick Pittman is aware of previous family history of genetic testing for hereditary cancer risks. There is no reported Ashkenazi Jewish ancestry. There is no known consanguinity.     GENETIC TEST RESULTS: Genetic testing reported out on 05/17/2021 through the [employer]  CancerNext-Exanded+RNA cancer panel found no pathogenic mutations.   The CancerNext-Expanded + RNAinsight gene panel offered by [employer] and includes sequencing and rearrangement analysis for the following 77 genes: IP, ALK, APC*, ATM*, AXIN2, BAP1, BARD1, BLM, BMPR1A, BRCA1*, BRCA2*, BRIP1*, CDC73, CDH1*,CDK4, CDKN1B, CDKN2A, CHEK2*, CTNNA1, DICER1, FANCC, FH, FLCN, GALNT12, KIF1B, LZTR1, MAX, MEN1, MET, MLH1*, MSH2*, MSH3, MSH6*, MUTYH*, NBN, NF1*, NF2, NTHL1, PALB2*, PHOX2B, PMS2*, POT1, PRKAR1A, PTCH1, PTEN*, RAD51C*, RAD51D*,RB1, RECQL, RET, SDHA, SDHAF2, SDHB, SDHC, SDHD, SMAD4, SMARCA4, SMARCB1, SMARCE1, STK11, SUFU, TMEM127, TP53*,TSC1, TSC2, VHL and XRCC2 (sequencing and deletion/duplication); EGFR, EGLN1, HOXB13, KIT, MITF, PDGFRA, POLD1 and POLE (sequencing only); EPCAM and GREM1 (deletion/duplication only).   The test report has been scanned into EPIC and is located under the Molecular Pathology section of the Results Review tab.  A portion of the result report is included below for reference.     We discussed that because current genetic testing is not perfect, it is possible there may be a gene mutation in one of these genes that current testing cannot detect, but that chance is small.  There could be another gene that has not yet been discovered, or that we have not yet tested, that is responsible for the cancer diagnoses in the family. It is also possible there is a hereditary cause for the cancer in the family that Patrick Pittman did not inherit and therefore was not identified in his testing.  Therefore, it is important to remain in touch with cancer genetics in the future so that  we can continue to offer Patrick Pittman the most up to date genetic testing.   ADDITIONAL GENETIC TESTING: We discussed with Patrick Pittman that his genetic testing was fairly extensive.  If there are genes identified to increase cancer risk that can be analyzed in the future, we would be happy to discuss and  coordinate this testing at that time.    CANCER SCREENING RECOMMENDATIONS: Patrick Pittman test result is considered negative (normal).  This means that we have not identified a hereditary cause for his  personal and family history of cancer at this time. Most cancers happen by chance and this negative test suggests that his cancer may fall into this category.    While reassuring, this does not definitively rule out a hereditary predisposition to cancer. It is still possible that there could be genetic mutations that are undetectable by current technology. There could be genetic mutations in genes that have not been tested or identified to increase cancer risk.  Therefore, it is recommended he continue to follow the cancer management and screening guidelines provided by his oncology and primary healthcare provider.   An individual's cancer risk and medical management are not determined by genetic test results alone. Overall cancer risk assessment incorporates additional factors, including personal medical history, family history, and any available genetic information that may result in a personalized plan for cancer prevention and surveillance.  RECOMMENDATIONS FOR FAMILY MEMBERS:  Relatives in this family might be at some increased risk of developing cancer, over the general population risk, simply due to the family history of cancer.  We recommended male relatives in this family have a yearly mammogram beginning at age 12, or 46 years younger than the earliest onset of cancer, an annual clinical breast exam, and perform monthly breast self-exams. Male relatives in this family should also have a gynecological exam as recommended by their primary provider.  All family members should be referred for colonoscopy starting at age 24.    It is also possible there is a hereditary cause for the cancer in Patrick Pittman family that he did not inherit and therefore was not identified in him.  Based on Patrick Pittman  family history, we recommended brothers/paternal relatives have genetic counseling and testing. Patrick Pittman will let us know if we can be of any assistance in coordinating genetic counseling and/or testing for these family members.  FOLLOW-UP: Lastly, we discussed with Patrick Pittman that cancer genetics is a rapidly advancing field and it is possible that new genetic tests will be appropriate for him and/or his family members in the future. We encouraged him to remain in contact with cancer genetics on an annual basis so we can update his personal and family histories and let him know of advances in cancer genetics that may benefit this family.   Our contact number was provided. Patrick Pittman questions were answered to his satisfaction, and he knows he is welcome to call us at anytime with additional questions or concerns.   Faith Rogue, MS, [facility] [email or web address] Phone: [phone]

## 2021-05-28 NOTE — Telephone Encounter (Signed)
Revealed negative genetic testing.  This normal result is reassuring and indicates that it is unlikely Mr. Pottenger cancer is due to a hereditary cause.  It is unlikely that there is an increased risk of another cancer due to a mutation in one of these genes.  However, genetic testing is not perfect, and cannot definitively rule out a hereditary cause.  It will be important for him to keep in contact with genetics to learn if any additional testing may be needed in the future.

## 2021-05-30 ENCOUNTER — Ambulatory Visit (INDEPENDENT_AMBULATORY_CARE_PROVIDER_SITE_OTHER): Payer: BC Managed Care – PPO | Admitting: Surgery

## 2021-05-30 ENCOUNTER — Encounter: Payer: Self-pay | Admitting: Surgery

## 2021-05-30 VITALS — BP 149/91 | HR 67 | Temp 98.0°F | Ht 70.5 in | Wt 231.0 lb

## 2021-05-30 DIAGNOSIS — K388 Other specified diseases of appendix: Secondary | ICD-10-CM | POA: Diagnosis not present

## 2021-05-30 MED ORDER — METRONIDAZOLE 500 MG PO TABS: ORAL_TABLET | ORAL | 0 refills | Status: DC

## 2021-05-30 MED ORDER — POLYETHYLENE GLYCOL 3350 17 GM/SCOOP PO POWD: ORAL | 0 refills | Status: DC

## 2021-05-30 MED ORDER — NEOMYCIN SULFATE 500 MG PO TABS: ORAL_TABLET | ORAL | 0 refills | Status: DC

## 2021-05-30 MED ORDER — BISACODYL 5 MG PO TBEC: DELAYED_RELEASE_TABLET | ORAL | 0 refills | Status: DC

## 2021-05-30 NOTE — Patient Instructions (Addendum)
We have scheduled you for an elective Appendectomy. Please see the following information regarding your surgery. We will check with Dr Erlene Quan about surgery dates.   Typically, our patients are out of work 1-2 weeks following the surgery and may return with a lifting restriction of no more than 15 lbs. For a complete 6 weeks following surgery.  If you have FMLA or Disability paperwork that needs to be filled out, please have your company fax your paperwork to [phone] or you may drop this by either office. This paperwork will be filled out within 3 days after your surgery has been completed.  You will need to do a bowel prep for your surgery incase we have to remove some of your colon during the surgery.   Also, please review the [facility]) Pre-Care sheet for further information regarding your surgery.   Our surgery schedule will call you to verify surgery date and to go over information.  If you have any questions, please do not hesitate to contact our office.  Laparoscopic Appendectomy, Adult, Care After Refer to this sheet in the next few weeks. These instructions provide you with information about caring for yourself after your procedure. Your health care provider may also give you more specific instructions. Your treatment has been planned according to current medical practices, but problems sometimes occur. Call your health care provider if you have any problems or questions after your procedure. What can I expect after the procedure? After the procedure, it is common to have: A decrease in your energy level. Mild pain in the area where the surgical cuts (incisions) were made. Constipation. This can be caused by pain medicine and a decrease in your activity.  Follow these instructions at home: Medicines Take over-the-counter and prescription medicines only as told by your health care provider. Do not drive for 24 hours if you received a sedative. Do not drive or operate heavy  machinery while taking prescription pain medicine. If you were prescribed an antibiotic medicine, take it as told by your health care provider. Do not stop taking the antibiotic even if you start to feel better. Activity For 3 weeks or as long as told by your health care provider: Do not lift anything that is heavier than 10 pounds (4.5 kg). Do not play contact sports. Gradually return to your normal activities. Ask your health care provider what activities are safe for you. Bathing Keep your incisions clean and dry. Clean them as often as told by your health care provider: Gently wash the incisions with soap and water. Rinse the incisions with water to remove all soap. Pat the incisions dry with a clean towel. Do not rub the incisions. You may take showers after 48 hours. Do not take baths, swim, or use hot tubs for 2 weeks or as told by your health care provider. Incision care Follow instructions from your healthcare provider about how to take care of your incisions. Make sure you: Wash your hands with soap and water before you change your bandage (dressing). If soap and water are not available, use hand sanitizer. Change your dressing as told by your health care provider. Leave stitches (sutures), skin glue, or adhesive strips in place. These skin closures may need to stay in place for 2 weeks or longer. If adhesive strip edges start to loosen and curl up, you may trim the loose edges. Do not remove adhesive strips completely unless your health care provider tells you to do that. Check your incision areas every  day for signs of infection. Check for: More redness, swelling, or pain. More fluid or blood. Warmth. Pus or a bad smell. Other Instructions If you were sent home with a drain, follow instructions from your health care provider about how to care for the drain and how to empty it. Take deep breaths. This helps to prevent your lungs from becoming inflamed. To relieve and prevent  constipation: Drink plenty of fluids. Eat plenty of fruits and vegetables. Keep all follow-up visits as told by your health care provider. This is important. Contact a health care provider if: You have more redness, swelling, or pain around an incision. You have more fluid or blood coming from an incision. Your incision feels warm to the touch. You have pus or a bad smell coming from an incision or dressing. Your incision edges break open after your sutures have been removed. You have increasing pain in your shoulders. You feel dizzy or you faint. You develop shortness of breath. You keep feeling nauseous or vomiting. You have diarrhea or you cannot control your bowel functions. You lose your appetite. You develop swelling or pain in your legs. Get help right away if: You have a fever. You develop a rash. You have difficulty breathing. You have sharp pains in your chest. This information is not intended to replace advice given to you by your health care provider. Make sure you discuss any questions you have with your health care provider. Document Released: 12/24/2004 Document Revised: 05/26/2015 Document Reviewed: 06/13/2014 Elsevier Interactive Patient Education  2017 [employer].

## 2021-05-30 NOTE — Progress Notes (Signed)
05/30/2021  History of Present Illness: Patrick Pittman is a 61 y.o. male presenting for follow up of an appendiceal mass.  The patient has history of prostate cancer and had a prostate MRI on 03/01/21 which showed a possible appendiceal mass vs fistula to the sigmoid colon.  This was followed up with CT of abdomen and pelvis which showed a 4.8 x 3.3 x 1.8 mass at the appendix, with concerning finding for possible fistula extending to the sigmoid colon.  He had a colonoscopy with Dr. Vicente Males on 05/23/21 which did not show any abnormalities with the sigmoid colon, but did show a possible fistula coming from the cecum.  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  On his CT scan, he was also found to have a left renal mass consistent with renal cell carcinoma.  He reports that he's doing well, without any abdominal pain, nausea, or vomiting.  Past Medical History: Past Medical History:  Diagnosis Date   Cancer ([place])    Hypertension    No blood products    Jehovah's witness     Past Surgical History: Past Surgical History:  Procedure Laterality Date   COLONOSCOPY     COLONOSCOPY WITH PROPOFOL N/A 05/23/2021   Procedure: COLONOSCOPY WITH PROPOFOL;  Surgeon: [name], MD;  Location: [facility] ENDOSCOPY;  Service: Gastroenterology;  Laterality: N/A;   RESECTION DISTAL CLAVICAL Right 07/26/2015   Procedure: Open excision of right distal clavicle.;  Surgeon: Corky Mull, MD;  Location: [place];  Service: Orthopedics;  Laterality: Right;    Home Medications: Prior to Admission medications   Medication Sig Start Date End Date Taking? Authorizing Provider  bisacodyl (DULCOLAX) 5 MG EC tablet Take all 4 tablets at 8 am the morning prior to your surgery. 05/30/21  Yes Naquan Garman, Jacqulyn Bath, MD  loratadine (CLARITIN) 10 MG tablet Take 10 mg by mouth daily. 05/24/21  Yes [provider]  metroNIDAZOLE (FLAGYL) 500 MG tablet Take 2 tablets  at 8AM, take 2 tablets at [facility], and take 2 tablets at 8PM the day prior to your surgery 05/30/21  Yes Alva Broxson, MD  neomycin (MYCIFRADIN) 500 MG tablet Take 2 tablet at 8am, take 2 tablets at 2pm, and take 2 tablets at 8pm the day prior to your surgery 05/30/21  Yes Marrissa Dai, MD  polyethylene glycol powder (MIRALAX) 17 GM/SCOOP powder Mix full container in 64 ounces of Gatorade or other clear liquid. NO Red 05/30/21  Yes Alesa Echevarria, MD  sildenafil (REVATIO) 20 MG tablet Take 20 mg by mouth. Take 2-5 tablets by mouth as needed   Yes [provider]  valsartan-hydrochlorothiazide (DIOVAN-HCT) 80-12.5 MG tablet Take 1 tablet by mouth daily. 02/08/21  Yes [provider]    Allergies: No Known Allergies  Review of Systems: Review of Systems  Constitutional:  Negative for chills and fever.  HENT:  Negative for hearing loss.   Respiratory:  Negative for shortness of breath.   Cardiovascular:  Negative for chest pain.  Gastrointestinal:  Negative for abdominal pain, constipation, diarrhea, nausea and vomiting.  Genitourinary:  Negative for dysuria.  Musculoskeletal:  Negative for myalgias.  Skin:  Negative for rash.  Neurological:  Negative for dizziness.  Psychiatric/Behavioral:  Negative for depression.    Physical Exam BP (!) 149/91   Pulse 67   Temp 98 F (36.7 C)   Ht 5' 10.5" (1.791 m)   Wt 231 lb (104.8 kg)  SpO2 98%   BMI 32.68 kg/m  CONSTITUTIONAL: No acute distress, well nourished. HEENT:  Normocephalic, atraumatic, extraocular motion intact. NECK:  Trachea is midline, no jugular venous distention. RESPIRATORY:  Lungs are clear, and breath sounds are equal bilaterally. Normal respiratory effort without pathologic use of accessory muscles. CARDIOVASCULAR: Heart is regular without murmurs, gallops, or rubs. GI: The abdomen is soft, non-distended, non-tender to palpation.  MUSCULOSKELETAL:  Normal gait, no peripheral edema NEUROLOGIC:  Motor and  sensation is grossly normal.  Cranial nerves are grossly intact. PSYCH:  Alert and oriented to person, place and time. Affect is normal.  Labs/Imaging: CT abdomen/pelvis 04/17/21: IMPRESSION: 1. 5.4 cm solid enhancing mass of the central left kidney, likely a renal cell carcinoma. No associated tumor thrombus in the left renal vein; no associated adenopathy identified. 2. Soft tissue mass of concern along the appendix measures 4.8 by 3.3 by 1.8 cm, enhances, and spans between the appendix in the sigmoid colon. Possibilities include chronic granulomatous tissue along a fistulous connection between the appendix and sigmoid colon, versus an indolent neuroendocrine or lymphomatous tumor of the appendix. Of note, this lesion is stable from 07/02/2008 although at that time there was a severe pancolitis and this lesion was more reminiscent of reactive adenopathy back in 2010. 3. Several small sclerotic lesions of the bony pelvis and lumbar spine, some new from 2010. While these may reflect benign bone islands, in light of the patient's prostate cancer, I would recommend a nuclear medicine bone scan in order to ensure the lack of scintigraphically active lesions. 4. Marked prostatomegaly. 5. Small type 1 hiatal hernia. 6. Lower lumbar impingement due to spondylosis and degenerative disc disease.  Colonoscopy 05/23/21: IMPRESSION: - Preparation of the colon was fair. - Four 5 to 6 mm polyps in the ascending colon, removed with a cold snare. Resected and retrieved. - Three 4 to 6 mm polyps in the cecum, removed with a cold snare. Resected and retrieved. - One 3 mm polyp in the rectum, removed with a jumbo cold forceps. Resected and retrieved. 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 - The examination was otherwise normal on direct and retroflexion views.   Assessment and Plan: This is a 61 y.o. male with an appendiceal mass in the setting of prostate cancer and left renal cell  carcinoma.  --Discussed with the patient the findings on the colonoscopy.  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  On personal re-evaluation of the CT scan images, this fistula is present, and appears to go to the proximal appendix, proximal to the mass.   --Discussed with him that given the findings, potentially we may only have to do an appendectomy, including a partial cecectomy to excise the fistula.  We would also investigate the sigmoid colon as it comes close to the tip of the appendix to evaluate for a fistula.  If present, would staple across and likely may need imbrication with suture, though discussed with him the potential for a larger resection if there are any concerns.  He's in agreement. --For now, my surgical plan would involve a laparoscopic appendectomy, but as a precaution, would also have the patient do a bowel prep in case any more significant resection is needed.  Discussed with him the procedure at length including risks of bleeding, infection, injury to surrounding structures.  Would try to coordinate with Dr. Erlene Quan to do this at the time of  his left nephrectomy.  We had initially planned for possible date of July 17th, but the patient reports that day is one of his daughters' birthday and would hope to be able to change the date if possible.  Will check with Dr. Erlene Quan. --Patient will follow up with me towards the end of next month for H&P update.  I spent 40 minutes dedicated to the care of this patient on the date of this encounter to include pre-visit review of records, face-to-face time with the patient discussing diagnosis and management, and any post-visit coordination of care.   [name], [facility]

## 2021-06-06 ENCOUNTER — Other Ambulatory Visit: Payer: Self-pay | Admitting: Urology

## 2021-06-06 DIAGNOSIS — N2889 Other specified disorders of kidney and ureter: Secondary | ICD-10-CM

## 2021-06-06 NOTE — Progress Notes (Signed)
Surgical Physician Order Form [facility]   * Scheduling expectation : Next Available  *Length of Case:   *Clearance needed: no  *Anticoagulation Instructions: Hold all anticoagulants  *Aspirin Instructions: Hold Aspirin  *Post-op visit Date/Instructions:  1 month follow up  *Diagnosis: Kidney Mass  *Procedure: left Laparoscopic radical nephrectomy (hand assist)   Additional orders: N/A  -Admit type: INpatient  -Anesthesia: General  -VTE Prophylaxis Standing Order SCD's       Other: Will need Cell Saver available  -Standing Lab Orders Per Anesthesia    Lab other: CBC, BMP, UA/urine culture, INR (declines type and screen)  -Standing Test orders EKG/Chest x-ray per Anesthesia       Test other:   - Medications:  Ancef 2gm IV  -Other orders:  N/A

## 2021-06-12 ENCOUNTER — Telehealth: Payer: Self-pay

## 2021-06-12 NOTE — Telephone Encounter (Signed)
I spoke with Patrick Pittman. We have discussed possible surgery dates and Monday July 17th, 2023 was agreed upon by all parties. Patient given information about surgery date, what to expect pre-operatively and post operatively.  We discussed that a Pre-Admission Testing office will be calling to set up the pre-op visit that will take place prior to surgery, and that these appointments are typically done over the phone with a Pre-Admissions RN.  Informed patient that our office will communicate any additional care to be provided after surgery. Patients questions or concerns were discussed during our call. Advised to call our office should there be any additional information, questions or concerns that arise. Patient verbalized understanding.

## 2021-06-12 NOTE — Progress Notes (Signed)
[facility] Posting Form   Surgery Date/Time: DATE: 07/23/2021  Surgeon: Dr. Hollice Espy, MD and Dr. Nickolas Madrid, MD  Surgery Location: Day Surgery  Inpt ( YES  )   Outpt (No)   Obs ( No  )   Diagnosis: Kidney Mass N28.89  -CPT: [phone]  Surgery: Left Laparoscopic Hand Assisted Radical Nephrectomy  Stop Anticoagulations: No  Cardiac/Medical/Pulmonary Clearance needed: no  *Orders entered into EPIC  Date: 06/12/21   *Case booked in EPIC  Date: 06/08/2021  *Notified pt of Surgery: Date: 06/08/2021  PRE-OP UA & CX: yes, will be obtained at pre-op appt along with CBC, BMP and INR (declines type and screen)  *Placed into Prior Authorization Work Que Date: 06/12/21   Assistant/laser/rep:No

## 2021-07-06 ENCOUNTER — Ambulatory Visit (INDEPENDENT_AMBULATORY_CARE_PROVIDER_SITE_OTHER): Payer: BC Managed Care – PPO | Admitting: Surgery

## 2021-07-06 ENCOUNTER — Encounter: Payer: Self-pay | Admitting: Surgery

## 2021-07-06 VITALS — BP 133/86 | HR 67 | Temp 98.2°F | Wt 229.0 lb

## 2021-07-06 DIAGNOSIS — K388 Other specified diseases of appendix: Secondary | ICD-10-CM

## 2021-07-06 NOTE — Progress Notes (Signed)
07/06/2021  History of Present Illness: Patrick Pittman is a 60 y.o. male presenting for follow-up in preparation for surgery.  The patient is scheduled for combined surgery between general surgery and urology for a left renal cell carcinoma requiring nephrectomy as well as an appendiceal mass requiring appendectomy.  The patient had a colonoscopy with Dr. 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  Today, the patient denies any abdominal pain, nausea, vomiting.  Denies any blood in his stool or troubles with bowel movements.  He is ready for surgery which is currently scheduled for 07/23/2021.  Past Medical History: Past Medical History:  Diagnosis Date   Cancer ([place])    Hypertension    No blood products    Jehovah's witness     Past Surgical History: Past Surgical History:  Procedure Laterality Date   COLONOSCOPY     COLONOSCOPY WITH PROPOFOL N/A 05/23/2021   Procedure: COLONOSCOPY WITH PROPOFOL;  Surgeon: [name], MD;  [facility] END[facility];  Service: Gastroenterology;  Laterality: N/A;   RESECTION DISTAL CLAVICAL Right 07/26/2015   Procedure: Open excision of right distal clavicle.;  Surgeon: Corky Mull, MD;  Location: [place];  Service: Orthopedics;  Laterality: Right;    Home Medications: Prior to Admission medications   Medication Sig Start Date End Date Taking? Authorizing Provider  bisacodyl (DULCOLAX) 5 MG EC tablet Take all 4 tablets at 8 am the morning prior to your surgery. 05/30/21  Yes Jeidy Hoerner, Jacqulyn Bath, MD  loratadine (CLARITIN) 10 MG tablet Take 10 mg by mouth daily. 05/24/21  Yes [provider]  metroNIDAZOLE (FLAGYL) 500 MG tablet Take 2 tablets at 8AM, take 2 tablets at [facility], and take 2 tablets at 8PM the day prior to your surgery 05/30/21  Yes Valeska Haislip, MD  neomycin (MYCIFRADIN) 500 MG tablet Take 2 tablet at  8am, take 2 tablets at 2pm, and take 2 tablets at 8pm the day prior to your surgery 05/30/21  Yes Haylie Mccutcheon, MD  polyethylene glycol powder (MIRALAX) 17 GM/SCOOP powder Mix full container in 64 ounces of Gatorade or other clear liquid. NO Red 05/30/21  Yes Addalyne Vandehei, MD  sildenafil (REVATIO) 20 MG tablet Take 20 mg by mouth. Take 2-5 tablets by mouth as needed   Yes [provider]  valsartan-hydrochlorothiazide (DIOVAN-HCT) 80-12.5 MG tablet Take 1 tablet by mouth daily. 02/08/21  Yes [provider]    Allergies: No Known Allergies  Review of Systems: Review of Systems  Constitutional:  Negative for chills and fever.  HENT:  Negative for hearing loss.   Respiratory:  Negative for shortness of breath.   Cardiovascular:  Negative for chest pain.  Gastrointestinal:  Negative for abdominal pain, blood in stool, constipation, diarrhea, nausea and vomiting.  Genitourinary:  Negative for dysuria.  Musculoskeletal:  Negative for myalgias.  Skin:  Negative for rash.  Neurological:  Negative for dizziness.  Psychiatric/Behavioral:  Negative for depression.     Physical Exam BP 133/86   Pulse 67   Temp 98.2 F (36.8 C) (Oral)   Wt 229 lb (103.9 kg)   SpO2 98%   BMI 32.39 kg/m  CONSTITUTIONAL: No acute distress, well-nourished HEENT:  Normocephalic, atraumatic, extraocular motion intact. NECK: Trachea is midline, no jugular venous distention. RESPIRATORY:  Lungs are clear, and breath sounds are equal bilaterally. Normal respiratory effort without pathologic use of  accessory muscles. CARDIOVASCULAR: Heart is regular without murmurs, gallops, or rubs. GI: The abdomen is soft, nondistended, nontender to palpation.  No palpable masses.  MUSCULOSKELETAL: Normal gait, no peripheral edema. NEUROLOGIC:  Motor and sensation is grossly normal.  Cranial nerves are grossly intact. PSYCH:  Alert and oriented to person, place and time. Affect is normal.  Labs/Imaging: CT  abdomen/pelvis on 04/17/2021: IMPRESSION: 1. 5.4 cm solid enhancing mass of the central left kidney, likely a renal cell carcinoma. No associated tumor thrombus in the left renal vein; no associated adenopathy identified. 2. Soft tissue mass of concern along the appendix measures 4.8 by 3.3 by 1.8 cm, enhances, and spans between the appendix in the sigmoid colon. Possibilities include chronic granulomatous tissue along a fistulous connection between the appendix and sigmoid colon, versus an indolent neuroendocrine or lymphomatous tumor of the appendix. Of note, this lesion is stable from 07/02/2008 although at that time there was a severe pancolitis and this lesion was more reminiscent of reactive adenopathy back in 2010. 3. Several small sclerotic lesions of the bony pelvis and lumbar spine, some new from 2010. While these may reflect benign bone islands, in light of the patient's prostate cancer, I would recommend a nuclear medicine bone scan in order to ensure the lack of scintigraphically active lesions. 4. Marked prostatomegaly. 5. Small type 1 hiatal hernia. 6. Lower lumbar impingement due to spondylosis and degenerative disc disease.  Assessment and Plan: This is a 61 y.o. male with left renal cell carcinoma as well as an appendiceal mass.  - Patient is currently scheduled for surgery on 07/23/2021.  Discussed with him again the reasoning behind performing an appendectomy in order to evaluate what this mass is.  During surgery, will evaluate the distal appendix to see if there is also a fistula communicating to the sigmoid colon.  This was not noticed during colonoscopy.  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  This area should still be amenable for resection during his appendectomy by taking a portion of the cecum out as well.  Discussed with him the risks of bleeding, infection, injury  to surrounding structures, postoperative activity restrictions, and he is willing to proceed. -Patient has a PET scan pending the schedule for 07/12/2021. - All of his questions have been answered.  I spent 40 minutes dedicated to the care of this patient on the date of this encounter to include pre-visit review of records, face-to-face time with the patient discussing diagnosis and management, and any post-visit coordination of care.   [name], [facility]

## 2021-07-06 NOTE — Patient Instructions (Signed)
If you have any concerns or questions, please feel free to call our office.   Laparoscopic Appendectomy, Adult, Care After What can I expect after the procedure? After a laparoscopic appendectomy, it is common to have: Little energy for normal activities. Mild pain in the area where the cuts from surgery (incisions) were made. Trouble pooping (constipation). This may be caused by: Pain medicine. A lack of activity. Gas pain that can spread to your shoulders. Follow these instructions at home: Your doctor may give you more instructions. If you have problems, contact your doctor. Medicines Take over-the-counter and prescription medicines only as told by your doctor. Do not stop taking antibiotics even if you start to feel better. Take pain medicines before your pain gets very bad. If told, take steps to prevent problems with pooping (constipation). You may need to: Drink enough fluid to keep your pee (urine) pale yellow. Take medicines. You will be told what medicines to take. Eat foods that are high in fiber. These include beans, whole grains, and fresh fruits and vegetables. Limit foods that are high in fat and sugar. These include fried or sweet foods. Ask your doctor if you should avoid driving or using machines while you are taking your medicine. Incision care  Follow instructions from your doctor about how to take care of your incisions. Make sure you: Wash your hands with soap and water for at least 20 seconds before and after you change your bandage. If you cannot use soap and water, use hand sanitizer. Change your bandage. Leave stitches, staples, or skin glue in place for at least 2 weeks. Leave tape strips alone unless you are told to take them off. You may trim the edges of the tape strips if they curl up. Check your incisions every day for signs of infection. Check for: Redness, swelling, or pain. Fluid or blood. Warmth. Pus or a bad smell. Bathing Do not take baths,  swim, or use a hot tub. Ask your doctor about taking showers or sponge baths. Keep your incisions clean and dry. Clean them as told by your doctor. To do this: Gently wash the incisions with soap and water. Rinse the incisions with water to get all the soap off. Pat the incisions dry with a clean towel. Do not rub the incisions. Activity  If you were given a sedative during your procedure, do not drive or use machines until your doctor says that it is safe. A sedative is a medicine that helps you relax. Rest as told by your doctor. Do not lift anything that is heavier than 10 lb (4.5 kg). Do not play contact sports until your doctor says that it is safe. Return to your normal activities when your doctor says that it is safe. General instructions If you were sent home with a drain, follow instructions from your doctor on how to care for it. Take deep breaths. This helps to keep your lungs from getting an infection (pneumonia). If you need to cough or sneeze, place a pillow or blanket on your belly before you do so. This will help you control the pain. Keep all follow-up visits. Contact a doctor if: You have any of these signs of infection in an incision: Redness, swelling, or pain. Fluid or blood. Warmth. Pus or a bad smell. Your incisions open after the stitches have been taken out. You lose your appetite. You feel as if you may vomit. You vomit. You get watery poop (diarrhea), or you cannot control your poop. You  get a rash. Get help right away if: You have a fever. You have trouble breathing. You have sharp pains in your chest. You have swelling or pain in your legs. You faint. These symptoms may be an emergency. Get help right away. Call 911. Do not wait to see if the symptoms will go away. Do not drive yourself to the hospital. Summary After the procedure, it is common to have low energy, mild pain, trouble pooping, and gas pain. Follow your doctor's instructions about  caring for yourself after the procedure. Rest after the procedure. Return to your normal activities as told by your doctor. Contact your doctor if you see signs of infection around your incisions. Get help right away if you have a fever, chest pain, or trouble breathing. This information is not intended to replace advice given to you by your health care provider. Make sure you discuss any questions you have with your health care provider. Document Revised: 10/05/2020 Document Reviewed: 10/05/2020 Elsevier Patient Education  [employer].

## 2021-07-06 NOTE — H&P (View-Only) (Signed)
07/06/2021  History of Present Illness: Patrick Pittman is a 61 y.o. male presenting for follow-up in preparation for surgery.  The patient is scheduled for combined surgery between general surgery and urology for a left renal cell carcinoma requiring nephrectomy as well as an appendiceal mass requiring appendectomy.  The patient had a colonoscopy with Dr. 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  Today, the patient denies any abdominal pain, nausea, vomiting.  Denies any blood in his stool or troubles with bowel movements.  He is ready for surgery which is currently scheduled for 07/23/2021.  Past Medical History: Past Medical History:  Diagnosis Date   Cancer ([place])    Hypertension    No blood products    Jehovah's witness     Past Surgical History: Past Surgical History:  Procedure Laterality Date   COLONOSCOPY     COLONOSCOPY WITH PROPOFOL N/A 05/23/2021   Procedure: COLONOSCOPY WITH PROPOFOL;  Surgeon: [name], MD;  Location: [facility] ENDOSCOPY;  Service: Gastroenterology;  Laterality: N/A;   RESECTION DISTAL CLAVICAL Right 07/26/2015   Procedure: Open excision of right distal clavicle.;  Surgeon: Corky Mull, MD;  Location: [facility];  Service: Orthopedics;  Laterality: Right;    Home Medications: Prior to Admission medications   Medication Sig Start Date End Date Taking? Authorizing Provider  bisacodyl (DULCOLAX) 5 MG EC tablet Take all 4 tablets at 8 am the morning prior to your surgery. 05/30/21  Yes Kymani Laursen, Jacqulyn Bath, MD  loratadine (CLARITIN) 10 MG tablet Take 10 mg by mouth daily. 05/24/21  Yes [provider]  metroNIDAZOLE (FLAGYL) 500 MG tablet Take 2 tablets at 8AM, take 2 tablets at [facility], and take 2 tablets at 8PM the day prior to your surgery 05/30/21  Yes Haden Suder, MD  neomycin (MYCIFRADIN) 500 MG tablet Take 2 tablet at  8am, take 2 tablets at 2pm, and take 2 tablets at 8pm the day prior to your surgery 05/30/21  Yes Marty Uy, MD  polyethylene glycol powder (MIRALAX) 17 GM/SCOOP powder Mix full container in 64 ounces of Gatorade or other clear liquid. NO Red 05/30/21  Yes Byanca Kasper, MD  sildenafil (REVATIO) 20 MG tablet Take 20 mg by mouth. Take 2-5 tablets by mouth as needed   Yes [provider]  valsartan-hydrochlorothiazide (DIOVAN-HCT) 80-12.5 MG tablet Take 1 tablet by mouth daily. 02/08/21  Yes [provider]    Allergies: No Known Allergies  Review of Systems: Review of Systems  Constitutional:  Negative for chills and fever.  HENT:  Negative for hearing loss.   Respiratory:  Negative for shortness of breath.   Cardiovascular:  Negative for chest pain.  Gastrointestinal:  Negative for abdominal pain, blood in stool, constipation, diarrhea, nausea and vomiting.  Genitourinary:  Negative for dysuria.  Musculoskeletal:  Negative for myalgias.  Skin:  Negative for rash.  Neurological:  Negative for dizziness.  Psychiatric/Behavioral:  Negative for depression.     Physical Exam BP 133/86   Pulse 67   Temp 98.2 F (36.8 C) (Oral)   Wt 229 lb (103.9 kg)   SpO2 98%   BMI 32.39 kg/m  CONSTITUTIONAL: No acute distress, well-nourished HEENT:  Normocephalic, atraumatic, extraocular motion intact. NECK: Trachea is midline, no jugular venous distention. RESPIRATORY:  Lungs are clear, and breath sounds are equal bilaterally. Normal respiratory effort without pathologic use of  accessory muscles. CARDIOVASCULAR: Heart is regular without murmurs, gallops, or rubs. GI: The abdomen is soft, nondistended, nontender to palpation.  No palpable masses.  MUSCULOSKELETAL: Normal gait, no peripheral edema. NEUROLOGIC:  Motor and sensation is grossly normal.  Cranial nerves are grossly intact. PSYCH:  Alert and oriented to person, place and time. Affect is normal.  Labs/Imaging: CT  abdomen/pelvis on 04/17/2021: IMPRESSION: 1. 5.4 cm solid enhancing mass of the central left kidney, likely a renal cell carcinoma. No associated tumor thrombus in the left renal vein; no associated adenopathy identified. 2. Soft tissue mass of concern along the appendix measures 4.8 by 3.3 by 1.8 cm, enhances, and spans between the appendix in the sigmoid colon. Possibilities include chronic granulomatous tissue along a fistulous connection between the appendix and sigmoid colon, versus an indolent neuroendocrine or lymphomatous tumor of the appendix. Of note, this lesion is stable from 07/02/2008 although at that time there was a severe pancolitis and this lesion was more reminiscent of reactive adenopathy back in 2010. 3. Several small sclerotic lesions of the bony pelvis and lumbar spine, some new from 2010. While these may reflect benign bone islands, in light of the patient's prostate cancer, I would recommend a nuclear medicine bone scan in order to ensure the lack of scintigraphically active lesions. 4. Marked prostatomegaly. 5. Small type 1 hiatal hernia. 6. Lower lumbar impingement due to spondylosis and degenerative disc disease.  Assessment and Plan: This is a 61 y.o. male with left renal cell carcinoma as well as an appendiceal mass.  - Patient is currently scheduled for surgery on 07/23/2021.  Discussed with him again the reasoning behind performing an appendectomy in order to evaluate what this mass is.  During surgery, will evaluate the distal appendix to see if there is also a fistula communicating to the sigmoid colon.  This was not noticed during colonoscopy.  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  This area should still be amenable for resection during his appendectomy by taking a portion of the cecum out as well.  Discussed with him the risks of bleeding, infection, injury  to surrounding structures, postoperative activity restrictions, and he is willing to proceed. -Patient has a PET scan pending the schedule for 07/12/2021. - All of his questions have been answered.  I spent 40 minutes dedicated to the care of this patient on the date of this encounter to include pre-visit review of records, face-to-face time with the patient discussing diagnosis and management, and any post-visit coordination of care.   [name], [facility]

## 2021-07-12 ENCOUNTER — Ambulatory Visit
Admission: RE | Admit: 2021-07-12 | Discharge: 2021-07-12 | Disposition: A | Discharge status: Home / Self Care | Payer: BC Managed Care – PPO | Source: Ambulatory Visit | Attending: Urology | Admitting: Urology

## 2021-07-12 DIAGNOSIS — N2889 Other specified disorders of kidney and ureter: Secondary | ICD-10-CM | POA: Diagnosis not present

## 2021-07-12 DIAGNOSIS — Z8546 Personal history of malignant neoplasm of prostate: Secondary | ICD-10-CM | POA: Diagnosis present

## 2021-07-12 DIAGNOSIS — D49512 Neoplasm of unspecified behavior of left kidney: Secondary | ICD-10-CM | POA: Insufficient documentation

## 2021-07-12 DIAGNOSIS — C642 Malignant neoplasm of left kidney, except renal pelvis: Secondary | ICD-10-CM | POA: Diagnosis not present

## 2021-07-12 DIAGNOSIS — Z8042 Family history of malignant neoplasm of prostate: Secondary | ICD-10-CM | POA: Insufficient documentation

## 2021-07-12 DIAGNOSIS — C61 Malignant neoplasm of prostate: Secondary | ICD-10-CM | POA: Diagnosis not present

## 2021-07-12 DIAGNOSIS — M899 Disorder of bone, unspecified: Secondary | ICD-10-CM | POA: Diagnosis not present

## 2021-07-12 MED ORDER — PIFLIFOLASTAT F 18 (PYLARIFY) INJECTION
9.0000 | Freq: Once | INTRAVENOUS | Status: AC
  Administered 2021-07-12: 9.29 via INTRAVENOUS

## 2021-07-13 ENCOUNTER — Telehealth: Payer: Self-pay

## 2021-07-13 NOTE — Telephone Encounter (Signed)
Pt aware and verbalized understanding.  

## 2021-07-13 NOTE — Telephone Encounter (Signed)
-----   Message from Hollice Espy, MD sent at 07/13/2021  2:09 PM EDT ----- Doristine Devoid news, the PET scan is negative other than for the known prostate cancer in your prostate.  This is more in line with what I expected.  There is no disease outside of your prostate.  Awesome news.  Hollice Espy, MD

## 2021-07-16 ENCOUNTER — Encounter
Admission: RE | Admit: 2021-07-16 | Discharge: 2021-07-16 | Disposition: A | Discharge status: Home / Self Care | Payer: BC Managed Care – PPO | Source: Ambulatory Visit | Attending: Urology | Admitting: Urology

## 2021-07-16 ENCOUNTER — Other Ambulatory Visit: Payer: Self-pay

## 2021-07-16 DIAGNOSIS — Z01812 Encounter for preprocedural laboratory examination: Secondary | ICD-10-CM

## 2021-07-16 HISTORY — DX: Personal history of other diseases of the digestive system: Z87.19

## 2021-07-16 NOTE — Patient Instructions (Addendum)
Your procedure is scheduled on: 07/23/21 - Monday Report to the Registration Desk on the 1st floor of the [facility]. To find out your arrival time, please call [phone] between 1PM - 3PM on: 07/20/21 - Friday If your arrival time is 6:00 am, do not arrive prior to that time as the [facility] entrance doors do not open until 6:00 am.  Report to [facility] for Labs/EKG on 07/18/21 at 3:30 pm.  REMEMBER: Instructions that are not followed completely may result in serious medical risk, up to and including death; or upon the discretion of your surgeon and anesthesiologist your surgery may need to be rescheduled.  Do not eat food or drink any fluids after midnight the night before surgery.  No gum chewing, lozengers or hard candies.   TAKE THESE MEDICATIONS THE MORNING OF SURGERY WITH A SIP OF WATER: NONE  One week prior to surgery: Stop Anti-inflammatories (NSAIDS) such as Advil, Aleve, Ibuprofen, Motrin, Naproxen, Naprosyn and Aspirin based products such as Excedrin, Goodys Powder, BC Powder.  Stop ANY OVER THE COUNTER supplements until after surgery.  You may take Tylenol if needed for pain up until the day of surgery.  No Alcohol for 24 hours before or after surgery.  No Smoking including e-cigarettes for 24 hours prior to surgery.  No chewable tobacco products for at least 6 hours prior to surgery.  No nicotine patches on the day of surgery.  Do not use any "recreational" drugs for at least a week prior to your surgery.  Please be advised that the combination of cocaine and anesthesia may have negative outcomes, up to and including death. If you test positive for cocaine, your surgery will be cancelled.  On the morning of surgery brush your teeth with toothpaste and water, you may rinse your mouth with mouthwash if you wish. Do not swallow any toothpaste or mouthwash.  Use CHG Soap or wipes as directed on instruction sheet.  Do not wear jewelry, make-up,  hairpins, clips or nail polish.  Do not wear lotions, powders, or perfumes.   Do not shave body from the neck down 48 hours prior to surgery just in case you cut yourself which could leave a site for infection.  Also, freshly shaved skin may become irritated if using the CHG soap.  Contact lenses, hearing aids and dentures may not be worn into surgery.  Do not bring valuables to the hospital. [facility] is not responsible for any missing/lost belongings or valuables.   Notify your doctor if there is any change in your medical condition (cold, fever, infection).  Wear comfortable clothing (specific to your surgery type) to the hospital.  After surgery, you can help prevent lung complications by doing breathing exercises.  Take deep breaths and cough every 1-2 hours. Your doctor may order a device called an Incentive Spirometer to help you take deep breaths. When coughing or sneezing, hold a pillow firmly against your incision with both hands. This is called "splinting." Doing this helps protect your incision. It also decreases belly discomfort.  If you are being admitted to the hospital overnight, leave your suitcase in the car. After surgery it may be brought to your room.  If you are being discharged the day of surgery, you will not be allowed to drive home. You will need a responsible adult (18 years or older) to drive you home and stay with you that night.   If you are taking public transportation, you will need to have  a responsible adult (18 years or older) with you. Please confirm with your physician that it is acceptable to use public transportation.   Please call the [facility]. at [phone] if you have any questions about these instructions.  Surgery Visitation Policy:  Patients undergoing a surgery or procedure may have two family members or support persons with them as long as the person is not COVID-19 positive or experiencing its symptoms.    Inpatient Visitation:    Visiting hours are 7 a.m. to 8 p.m. Up to four visitors are allowed at one time in a patient room, including children. The visitors may rotate out with other people during the day. One designated support person (adult) may remain overnight.

## 2021-07-18 ENCOUNTER — Encounter: Payer: Self-pay | Admitting: Urgent Care

## 2021-07-18 ENCOUNTER — Encounter
Admission: RE | Admit: 2021-07-18 | Discharge: 2021-07-18 | Disposition: A | Discharge status: Home / Self Care | Payer: BC Managed Care – PPO | Source: Ambulatory Visit | Attending: Urology | Admitting: Urology

## 2021-07-18 DIAGNOSIS — Z01818 Encounter for other preprocedural examination: Secondary | ICD-10-CM | POA: Diagnosis not present

## 2021-07-18 DIAGNOSIS — N2889 Other specified disorders of kidney and ureter: Secondary | ICD-10-CM | POA: Insufficient documentation

## 2021-07-18 DIAGNOSIS — Z01810 Encounter for preprocedural cardiovascular examination: Secondary | ICD-10-CM

## 2021-07-18 DIAGNOSIS — Z01812 Encounter for preprocedural laboratory examination: Secondary | ICD-10-CM

## 2021-07-18 LAB — BASIC METABOLIC PANEL
Anion gap: 7 (ref 5–15)
BUN: 14 mg/dL (ref 6–20)
CO2: 25 mmol/L (ref 22–32)
Calcium: 9.4 mg/dL (ref 8.9–10.3)
Chloride: 108 mmol/L (ref 98–111)
Creatinine, Ser: 1.14 mg/dL (ref 0.61–1.24)
GFR, Estimated: >60 mL/min (ref >60)
Glucose, Bld: 130 mg/dL — ABNORMAL HIGH (ref 70–99)
Potassium: 3.4 mmol/L — ABNORMAL LOW (ref 3.5–5.1)
Sodium: 140 mmol/L (ref 135–145)

## 2021-07-18 LAB — URINALYSIS, COMPLETE (UACMP) WITH MICROSCOPIC
Bacteria, UA: NONE SEEN
Bilirubin Urine: NEGATIVE
Glucose, UA: NEGATIVE mg/dL
Hgb urine dipstick: NEGATIVE
Ketones, ur: NEGATIVE mg/dL
Nitrite: NEGATIVE
Protein, ur: NEGATIVE mg/dL
Specific Gravity, Urine: 1.017 (ref 1.005–1.030)
Squamous Epithelial / HPF: NONE SEEN (ref 0–5)
pH: 5 (ref 5.0–8.0)

## 2021-07-18 LAB — CBC
HCT: 47.2 % (ref 39.0–52.0)
Hemoglobin: 15.9 g/dL (ref 13.0–17.0)
MCH: 28.1 pg (ref 26.0–34.0)
MCHC: 33.7 g/dL (ref 30.0–36.0)
MCV: 83.5 fL (ref 80.0–100.0)
Platelets: 273 10*3/uL (ref 150–400)
RBC: 5.65 MIL/uL (ref 4.22–5.81)
RDW: 14 % (ref 11.5–15.5)
WBC: 4.4 10*3/uL (ref 4.0–10.5)
nRBC: 0 % (ref 0.0–0.2)

## 2021-07-18 LAB — PROTIME-INR
INR: 1 (ref 0.8–1.2)
Prothrombin Time: 13 seconds (ref 11.4–15.2)

## 2021-07-19 LAB — URINE CULTURE: Culture: NO GROWTH

## 2021-07-23 ENCOUNTER — Inpatient Hospital Stay
Admission: RE | Admit: 2021-07-23 | Discharge: 2021-08-02 | DRG: 657 | Disposition: A | Discharge status: Home / Self Care | Payer: BC Managed Care – PPO | Attending: Surgery | Admitting: Surgery

## 2021-07-23 ENCOUNTER — Other Ambulatory Visit: Payer: Self-pay

## 2021-07-23 ENCOUNTER — Inpatient Hospital Stay: Payer: BC Managed Care – PPO | Admitting: Anesthesiology

## 2021-07-23 ENCOUNTER — Encounter: Payer: Self-pay | Admitting: Urology

## 2021-07-23 ENCOUNTER — Encounter
Admission: RE | Disposition: A | Discharge status: Home / Self Care | Payer: Self-pay | Source: Home / Self Care | Attending: Surgery

## 2021-07-23 DIAGNOSIS — C7A8 Other malignant neuroendocrine tumors: Secondary | ICD-10-CM | POA: Diagnosis not present

## 2021-07-23 DIAGNOSIS — Z8546 Personal history of malignant neoplasm of prostate: Secondary | ICD-10-CM | POA: Diagnosis not present

## 2021-07-23 DIAGNOSIS — K567 Ileus, unspecified: Secondary | ICD-10-CM | POA: Diagnosis not present

## 2021-07-23 DIAGNOSIS — Z419 Encounter for procedure for purposes other than remedying health state, unspecified: Principal | ICD-10-CM

## 2021-07-23 DIAGNOSIS — Z6833 Body mass index (BMI) 33.0-33.9, adult: Secondary | ICD-10-CM | POA: Diagnosis not present

## 2021-07-23 DIAGNOSIS — K383 Fistula of appendix: Secondary | ICD-10-CM | POA: Diagnosis not present

## 2021-07-23 DIAGNOSIS — R948 Abnormal results of function studies of other organs and systems: Secondary | ICD-10-CM | POA: Diagnosis present

## 2021-07-23 DIAGNOSIS — E860 Dehydration: Secondary | ICD-10-CM | POA: Diagnosis present

## 2021-07-23 DIAGNOSIS — K9189 Other postprocedural complications and disorders of digestive system: Secondary | ICD-10-CM | POA: Diagnosis not present

## 2021-07-23 DIAGNOSIS — C642 Malignant neoplasm of left kidney, except renal pelvis: Secondary | ICD-10-CM | POA: Diagnosis present

## 2021-07-23 DIAGNOSIS — K632 Fistula of intestine: Secondary | ICD-10-CM | POA: Diagnosis present

## 2021-07-23 DIAGNOSIS — Z8042 Family history of malignant neoplasm of prostate: Secondary | ICD-10-CM | POA: Diagnosis not present

## 2021-07-23 DIAGNOSIS — N2889 Other specified disorders of kidney and ureter: Secondary | ICD-10-CM | POA: Diagnosis present

## 2021-07-23 DIAGNOSIS — K388 Other specified diseases of appendix: Secondary | ICD-10-CM | POA: Diagnosis not present

## 2021-07-23 DIAGNOSIS — K5651 Intestinal adhesions [bands], with partial obstruction: Secondary | ICD-10-CM | POA: Diagnosis present

## 2021-07-23 DIAGNOSIS — E669 Obesity, unspecified: Secondary | ICD-10-CM | POA: Diagnosis present

## 2021-07-23 DIAGNOSIS — E876 Hypokalemia: Secondary | ICD-10-CM | POA: Diagnosis present

## 2021-07-23 DIAGNOSIS — I10 Essential (primary) hypertension: Secondary | ICD-10-CM | POA: Diagnosis present

## 2021-07-23 DIAGNOSIS — N183 Chronic kidney disease, stage 3 unspecified: Secondary | ICD-10-CM | POA: Diagnosis present

## 2021-07-23 DIAGNOSIS — C7B8 Other secondary neuroendocrine tumors: Secondary | ICD-10-CM | POA: Diagnosis present

## 2021-07-23 DIAGNOSIS — Z79899 Other long term (current) drug therapy: Secondary | ICD-10-CM | POA: Diagnosis not present

## 2021-07-23 HISTORY — PX: LAPAROSCOPIC NEPHRECTOMY, HAND ASSISTED: SHX1929

## 2021-07-23 HISTORY — PX: LAPAROSCOPIC APPENDECTOMY: SHX408

## 2021-07-23 HISTORY — PX: TAKE DOWN OF INTESTINAL FISTULA: SHX6107

## 2021-07-23 SURGERY — NEPHRECTOMY, HAND-ASSISTED, LAPAROSCOPIC
Anesthesia: General | Site: Abdomen

## 2021-07-23 MED ORDER — DIPHENHYDRAMINE HCL 50 MG/ML IJ SOLN: 12.5000 mg | Freq: Four times a day (QID) | INTRAMUSCULAR | Status: DC | PRN

## 2021-07-23 MED ORDER — OXYBUTYNIN CHLORIDE ER 10 MG PO TB24
10.0000 mg | ORAL_TABLET | Freq: Every day | ORAL | Status: DC
  Administered 2021-07-24 – 2021-08-02 (×10): 10 mg via ORAL
  Filled 2021-07-23 (×11): qty 1

## 2021-07-23 MED ORDER — ONDANSETRON HCL 4 MG/2ML IJ SOLN
4.0000 mg | INTRAMUSCULAR | Status: DC | PRN
Start: 2021-07-23 — End: 2021-08-02
  Administered 2021-07-26: 4 mg via INTRAVENOUS
  Filled 2021-07-23: qty 2

## 2021-07-23 MED ORDER — CHLORHEXIDINE GLUCONATE 0.12 % MT SOLN: 15.0000 mL | Freq: Once | OROMUCOSAL | Status: AC

## 2021-07-23 MED ORDER — GLYCOPYRROLATE 0.2 MG/ML IJ SOLN
INTRAMUSCULAR | Status: DC | PRN
  Administered 2021-07-23: .2 mg via INTRAVENOUS

## 2021-07-23 MED ORDER — ONDANSETRON HCL 4 MG/2ML IJ SOLN: 4.0000 mg | Freq: Once | INTRAMUSCULAR | Status: DC | PRN

## 2021-07-23 MED ORDER — PHENYLEPHRINE HCL-NACL 20-0.9 MG/250ML-% IV SOLN
INTRAVENOUS | Status: DC | PRN
  Administered 2021-07-23: 15 ug/min via INTRAVENOUS

## 2021-07-23 MED ORDER — KETAMINE HCL 10 MG/ML IJ SOLN
INTRAMUSCULAR | Status: DC | PRN
  Administered 2021-07-23: 20 mg via INTRAVENOUS

## 2021-07-23 MED ORDER — OXYBUTYNIN CHLORIDE 5 MG PO TABS: 5.0000 mg | ORAL_TABLET | Freq: Three times a day (TID) | ORAL | Status: DC | PRN

## 2021-07-23 MED ORDER — SODIUM CHLORIDE 0.9 % IV SOLN: INTRAVENOUS | Status: DC | PRN

## 2021-07-23 MED ORDER — HEPARIN 30,000 UNITS/1000 ML (OHS) CELLSAVER SOLUTION
Status: AC
  Filled 2021-07-23: qty 1000

## 2021-07-23 MED ORDER — SODIUM CHLORIDE (PF) 0.9 % IJ SOLN
INTRAMUSCULAR | Status: AC
  Filled 2021-07-23: qty 50

## 2021-07-23 MED ORDER — HEPARIN SODIUM (PORCINE) 5000 UNIT/ML IJ SOLN
5000.0000 [IU] | Freq: Three times a day (TID) | INTRAMUSCULAR | Status: DC
Start: 2021-07-23 — End: 2021-08-02
  Administered 2021-07-23 – 2021-08-02 (×27): 5000 [IU] via SUBCUTANEOUS
  Filled 2021-07-23 (×29): qty 1

## 2021-07-23 MED ORDER — MORPHINE SULFATE (PF) 2 MG/ML IV SOLN
2.0000 mg | INTRAVENOUS | Status: DC | PRN
  Administered 2021-07-23 – 2021-07-25 (×5): 2 mg via INTRAVENOUS
  Filled 2021-07-23 (×6): qty 1

## 2021-07-23 MED ORDER — FENTANYL CITRATE (PF) 250 MCG/5ML IJ SOLN
INTRAMUSCULAR | Status: AC
  Filled 2021-07-23: qty 5

## 2021-07-23 MED ORDER — CHLORHEXIDINE GLUCONATE CLOTH 2 % EX PADS: 6.0000 | MEDICATED_PAD | Freq: Once | CUTANEOUS | Status: DC

## 2021-07-23 MED ORDER — ACETAMINOPHEN 500 MG PO TABS
ORAL_TABLET | ORAL | Status: AC
  Administered 2021-07-23: 1000 mg via ORAL
  Filled 2021-07-23: qty 2

## 2021-07-23 MED ORDER — ACETAMINOPHEN 500 MG PO TABS: 1000.0000 mg | ORAL_TABLET | ORAL | Status: AC

## 2021-07-23 MED ORDER — CEFAZOLIN SODIUM-DEXTROSE 2-4 GM/100ML-% IV SOLN: 2.0000 g | INTRAVENOUS | Status: DC

## 2021-07-23 MED ORDER — MIDAZOLAM HCL 2 MG/2ML IJ SOLN
INTRAMUSCULAR | Status: AC
  Filled 2021-07-23: qty 2

## 2021-07-23 MED ORDER — SODIUM CHLORIDE 0.9 % IV SOLN: INTRAVENOUS | Status: AC

## 2021-07-23 MED ORDER — SODIUM CHLORIDE (PF) 0.9 % IJ SOLN
INTRAMUSCULAR | Status: DC | PRN
  Administered 2021-07-23: 50 mL

## 2021-07-23 MED ORDER — KETAMINE HCL 50 MG/5ML IJ SOSY
PREFILLED_SYRINGE | INTRAMUSCULAR | Status: AC
  Filled 2021-07-23: qty 5

## 2021-07-23 MED ORDER — OXYCODONE HCL 5 MG PO TABS: 5.0000 mg | ORAL_TABLET | Freq: Once | ORAL | Status: DC | PRN

## 2021-07-23 MED ORDER — SUCCINYLCHOLINE CHLORIDE 200 MG/10ML IV SOSY
PREFILLED_SYRINGE | INTRAVENOUS | Status: DC | PRN
  Administered 2021-07-23: 100 mg via INTRAVENOUS

## 2021-07-23 MED ORDER — DEXMEDETOMIDINE HCL IN NACL 200 MCG/50ML IV SOLN
INTRAVENOUS | Status: DC | PRN
  Administered 2021-07-23 (×2): 8 ug via INTRAVENOUS

## 2021-07-23 MED ORDER — OXYCODONE HCL 5 MG/5ML PO SOLN: 5.0000 mg | Freq: Once | ORAL | Status: DC | PRN

## 2021-07-23 MED ORDER — LACTATED RINGERS IV SOLN: INTRAVENOUS | Status: DC

## 2021-07-23 MED ORDER — VISTASEAL 10 ML SINGLE DOSE KIT
PACK | CUTANEOUS | Status: DC | PRN
  Administered 2021-07-23: 10 mL via TOPICAL

## 2021-07-23 MED ORDER — SODIUM CHLORIDE 0.9 % IR SOLN
Status: DC | PRN
  Administered 2021-07-23: 1000 mL

## 2021-07-23 MED ORDER — DIPHENHYDRAMINE HCL 12.5 MG/5ML PO ELIX: 12.5000 mg | ORAL_SOLUTION | Freq: Four times a day (QID) | ORAL | Status: DC | PRN

## 2021-07-23 MED ORDER — CHLORHEXIDINE GLUCONATE 0.12 % MT SOLN
OROMUCOSAL | Status: AC
  Administered 2021-07-23: 15 mL via OROMUCOSAL
  Filled 2021-07-23: qty 15

## 2021-07-23 MED ORDER — HYDROCHLOROTHIAZIDE 12.5 MG PO TABS
12.5000 mg | ORAL_TABLET | Freq: Every day | ORAL | Status: DC
  Administered 2021-07-24 – 2021-08-02 (×10): 12.5 mg via ORAL
  Filled 2021-07-23 (×11): qty 1

## 2021-07-23 MED ORDER — CEFAZOLIN SODIUM-DEXTROSE 1-4 GM/50ML-% IV SOLN
1.0000 g | Freq: Three times a day (TID) | INTRAVENOUS | Status: AC
  Administered 2021-07-23 (×2): 1 g via INTRAVENOUS
  Filled 2021-07-23 (×3): qty 50

## 2021-07-23 MED ORDER — HEPARIN 30,000 UNITS/1000 ML (OHS) CELLSAVER SOLUTION
Status: AC | PRN
  Administered 2021-07-23: 1

## 2021-07-23 MED ORDER — ROCURONIUM BROMIDE 100 MG/10ML IV SOLN
INTRAVENOUS | Status: DC | PRN
  Administered 2021-07-23 (×2): 10 mg via INTRAVENOUS
  Administered 2021-07-23 (×2): 40 mg via INTRAVENOUS

## 2021-07-23 MED ORDER — GABAPENTIN 300 MG PO CAPS: 300.0000 mg | ORAL_CAPSULE | ORAL | Status: AC

## 2021-07-23 MED ORDER — SUGAMMADEX SODIUM 500 MG/5ML IV SOLN
INTRAVENOUS | Status: DC | PRN
  Administered 2021-07-23: 500 mg via INTRAVENOUS

## 2021-07-23 MED ORDER — FENTANYL CITRATE (PF) 100 MCG/2ML IJ SOLN
INTRAMUSCULAR | Status: DC | PRN
Start: 2021-07-23 — End: 2021-07-23
  Administered 2021-07-23: 100 ug via INTRAVENOUS
  Administered 2021-07-23 (×2): 50 ug via INTRAVENOUS

## 2021-07-23 MED ORDER — CEFAZOLIN SODIUM-DEXTROSE 2-4 GM/100ML-% IV SOLN
2.0000 g | INTRAVENOUS | Status: AC
  Administered 2021-07-23 (×2): 2 g via INTRAVENOUS

## 2021-07-23 MED ORDER — MIDAZOLAM HCL 2 MG/2ML IJ SOLN
INTRAMUSCULAR | Status: DC | PRN
  Administered 2021-07-23: 2 mg via INTRAVENOUS

## 2021-07-23 MED ORDER — SURGIFLO WITH THROMBIN (HEMOSTATIC MATRIX KIT) OPTIME
TOPICAL | Status: DC | PRN
  Administered 2021-07-23: 1 via TOPICAL

## 2021-07-23 MED ORDER — ACETAMINOPHEN 10 MG/ML IV SOLN: 1000.0000 mg | Freq: Once | INTRAVENOUS | Status: DC | PRN

## 2021-07-23 MED ORDER — SODIUM CHLORIDE FLUSH 0.9 % IV SOLN
INTRAVENOUS | Status: AC
  Filled 2021-07-23: qty 10

## 2021-07-23 MED ORDER — BUPIVACAINE LIPOSOME 1.3 % IJ SUSP
INTRAMUSCULAR | Status: AC
  Filled 2021-07-23: qty 20

## 2021-07-23 MED ORDER — GABAPENTIN 300 MG PO CAPS
ORAL_CAPSULE | ORAL | Status: AC
  Administered 2021-07-23: 300 mg via ORAL
  Filled 2021-07-23: qty 1

## 2021-07-23 MED ORDER — FENTANYL CITRATE (PF) 100 MCG/2ML IJ SOLN: 25.0000 ug | INTRAMUSCULAR | Status: DC | PRN

## 2021-07-23 MED ORDER — HYDROMORPHONE HCL 1 MG/ML IJ SOLN
INTRAMUSCULAR | Status: AC
  Filled 2021-07-23: qty 1

## 2021-07-23 MED ORDER — STERILE WATER FOR IRRIGATION IR SOLN
Status: DC | PRN
  Administered 2021-07-23: 1000 mL

## 2021-07-23 MED ORDER — HYDROMORPHONE HCL 1 MG/ML IJ SOLN
INTRAMUSCULAR | Status: DC | PRN
  Administered 2021-07-23 (×2): .5 mg via INTRAVENOUS

## 2021-07-23 MED ORDER — FAMOTIDINE 20 MG PO TABS: 20.0000 mg | ORAL_TABLET | Freq: Once | ORAL | Status: AC

## 2021-07-23 MED ORDER — LORATADINE 10 MG PO TABS
10.0000 mg | ORAL_TABLET | Freq: Every day | ORAL | Status: DC
  Administered 2021-07-24 – 2021-08-02 (×10): 10 mg via ORAL
  Filled 2021-07-23 (×10): qty 1

## 2021-07-23 MED ORDER — FAMOTIDINE 20 MG PO TABS
ORAL_TABLET | ORAL | Status: AC
  Administered 2021-07-23: 20 mg via ORAL
  Filled 2021-07-23: qty 1

## 2021-07-23 MED ORDER — ONDANSETRON HCL 4 MG/2ML IJ SOLN
INTRAMUSCULAR | Status: DC | PRN
  Administered 2021-07-23 (×2): 4 mg via INTRAVENOUS

## 2021-07-23 MED ORDER — IRBESARTAN 150 MG PO TABS
75.0000 mg | ORAL_TABLET | Freq: Every day | ORAL | Status: DC
  Administered 2021-07-24 – 2021-08-02 (×10): 75 mg via ORAL
  Filled 2021-07-23 (×11): qty 1

## 2021-07-23 MED ORDER — HEMOSTATIC AGENTS (NO CHARGE) OPTIME
TOPICAL | Status: DC | PRN
  Administered 2021-07-23: 1 via TOPICAL

## 2021-07-23 MED ORDER — LIDOCAINE HCL (CARDIAC) PF 100 MG/5ML IV SOSY
PREFILLED_SYRINGE | INTRAVENOUS | Status: DC | PRN
  Administered 2021-07-23: 100 mg via INTRAVENOUS

## 2021-07-23 MED ORDER — ACETAMINOPHEN 325 MG PO TABS: 650.0000 mg | ORAL_TABLET | ORAL | Status: DC | PRN

## 2021-07-23 MED ORDER — DOCUSATE SODIUM 100 MG PO CAPS
100.0000 mg | ORAL_CAPSULE | Freq: Two times a day (BID) | ORAL | Status: DC
  Administered 2021-07-23 – 2021-08-02 (×19): 100 mg via ORAL
  Filled 2021-07-23 (×21): qty 1

## 2021-07-23 MED ORDER — BUPIVACAINE-EPINEPHRINE (PF) 0.5% -1:200000 IJ SOLN
INTRAMUSCULAR | Status: AC
  Filled 2021-07-23: qty 30

## 2021-07-23 MED ORDER — OXYCODONE-ACETAMINOPHEN 5-325 MG PO TABS
1.0000 | ORAL_TABLET | ORAL | Status: DC | PRN
  Administered 2021-07-24 (×2): 2 via ORAL
  Filled 2021-07-23 (×2): qty 2

## 2021-07-23 MED ORDER — BUPIVACAINE HCL (PF) 0.5 % IJ SOLN
INTRAMUSCULAR | Status: AC
  Filled 2021-07-23: qty 30

## 2021-07-23 MED ORDER — PROPOFOL 1000 MG/100ML IV EMUL
INTRAVENOUS | Status: AC
  Filled 2021-07-23: qty 100

## 2021-07-23 MED ORDER — VALSARTAN-HYDROCHLOROTHIAZIDE 80-12.5 MG PO TABS: 1.0000 | ORAL_TABLET | Freq: Every day | ORAL | Status: DC

## 2021-07-23 MED ORDER — CEFAZOLIN SODIUM-DEXTROSE 2-4 GM/100ML-% IV SOLN
INTRAVENOUS | Status: AC
  Filled 2021-07-23: qty 100

## 2021-07-23 MED ORDER — PROPOFOL 10 MG/ML IV BOLUS
INTRAVENOUS | Status: DC | PRN
  Administered 2021-07-23: 170 mg via INTRAVENOUS

## 2021-07-23 MED ORDER — ORAL CARE MOUTH RINSE: 15.0000 mL | Freq: Once | OROMUCOSAL | Status: AC

## 2021-07-23 MED ORDER — DEXAMETHASONE SODIUM PHOSPHATE 10 MG/ML IJ SOLN
INTRAMUSCULAR | Status: DC | PRN
  Administered 2021-07-23: 10 mg via INTRAVENOUS

## 2021-07-23 SURGICAL SUPPLY — 105 items
APPLICATOR SURGIFLO ENDO (HEMOSTASIS) ×1 IMPLANT
APPLICATOR VISTASEAL 35 (MISCELLANEOUS) ×1 IMPLANT
APPLIER CLIP ROT 13.4 12 LRG (CLIP)
BAG LAPAROSCOPIC 12 15 PORT 16 (BASKET) ×3 IMPLANT
BAG RETRIEVAL 12/15 (BASKET) ×4
BLADE CLIPPER SURG (BLADE) ×1 IMPLANT
CHLORAPREP W/TINT 26 (MISCELLANEOUS) ×4 IMPLANT
CLEANER CAUTERY TIP 5X5 PAD (MISCELLANEOUS) ×3 IMPLANT
CLIP APPLIE ROT 13.4 12 LRG (CLIP) IMPLANT
CLIP LIGATING HEM O LOK PURPLE (MISCELLANEOUS) ×5 IMPLANT
CNTNR SPEC 2.5X3XGRAD LEK (MISCELLANEOUS) ×3
CONT SPEC 4OZ STER OR WHT (MISCELLANEOUS) ×1
CONTAINER SPEC 2.5X3XGRAD LEK (MISCELLANEOUS) IMPLANT
DEFOGGER SCOPE WARMER CLEARIFY (MISCELLANEOUS) ×4 IMPLANT
DERMABOND ADVANCED (GAUZE/BANDAGES/DRESSINGS) ×3
DERMABOND ADVANCED .7 DNX12 (GAUZE/BANDAGES/DRESSINGS) ×9 IMPLANT
DRAPE INCISE IOBAN 66X45 STRL (DRAPES) ×4 IMPLANT
DRAPE LAPAROSCOPIC ABDOMINAL (DRAPES) IMPLANT
DRAPE STERI POUCH LG 24X46 STR (DRAPES) ×3 IMPLANT
DRAPE SURG 17X11 SM STRL (DRAPES) ×12 IMPLANT
DRSG TEGADERM 2-3/8X2-3/4 SM (GAUZE/BANDAGES/DRESSINGS) IMPLANT
DRSG TEGADERM 4X4.75 (GAUZE/BANDAGES/DRESSINGS) IMPLANT
DRSG TELFA 3X8 NADH (GAUZE/BANDAGES/DRESSINGS) IMPLANT
ELECT REM PT RETURN 9FT ADLT (ELECTROSURGICAL) ×8
ELECTRODE REM PT RTRN 9FT ADLT (ELECTROSURGICAL) ×6 IMPLANT
GLOVE BIO SURGEON STRL SZ 6.5 (GLOVE) ×8 IMPLANT
GLOVE SURG SYN 7.0 (GLOVE) ×4 IMPLANT
GLOVE SURG SYN 7.0 PF PI (GLOVE) ×3 IMPLANT
GLOVE SURG SYN 7.5  E (GLOVE) ×1
GLOVE SURG SYN 7.5 E (GLOVE) ×3 IMPLANT
GLOVE SURG SYN 7.5 PF PI (GLOVE) ×3 IMPLANT
GLOVE SURG UNDER LTX SZ6.5 (GLOVE) ×4 IMPLANT
GOWN STRL REUS W/ TWL LRG LVL3 (GOWN DISPOSABLE) ×15 IMPLANT
GOWN STRL REUS W/TWL LRG LVL3 (GOWN DISPOSABLE) ×5
GRASPER SUT TROCAR 14GX15 (MISCELLANEOUS) ×4 IMPLANT
HANDLE YANKAUER SUCT BULB TIP (MISCELLANEOUS) ×4 IMPLANT
HEMOSTAT SURGICEL 2X14 (HEMOSTASIS) ×1 IMPLANT
HOLDER FOLEY CATH W/STRAP (MISCELLANEOUS) ×3 IMPLANT
IRRIGATION STRYKERFLOW (MISCELLANEOUS) ×3 IMPLANT
IRRIGATOR STRYKERFLOW (MISCELLANEOUS) ×4
IV NS 1000ML (IV SOLUTION) ×1
IV NS 1000ML BAXH (IV SOLUTION) ×3 IMPLANT
KIT PINK PAD W/HEAD ARE REST (MISCELLANEOUS) ×4
KIT PINK PAD W/HEAD ARM REST (MISCELLANEOUS) ×3 IMPLANT
KITTNER LAPARASCOPIC 5X40 (MISCELLANEOUS) ×4 IMPLANT
L-HOOK LAP DISP 36CM (ELECTROSURGICAL) ×4
LABEL OR SOLS (LABEL) ×8 IMPLANT
LHOOK LAP DISP 36CM (ELECTROSURGICAL) ×3 IMPLANT
LIGASURE LAP ATLAS 10MM 37CM (INSTRUMENTS) ×4 IMPLANT
LIGASURE LAP MARYLAND 5MM 37CM (ELECTROSURGICAL) ×1 IMPLANT
LOOP RED MAXI  1X406MM (MISCELLANEOUS) ×1
LOOP VESSEL MAXI 1X406 RED (MISCELLANEOUS) ×3 IMPLANT
MANIFOLD NEPTUNE II (INSTRUMENTS) ×7 IMPLANT
NDL HYPO 21X1.5 SAFETY (NEEDLE) ×3 IMPLANT
NEEDLE HYPO 21X1.5 SAFETY (NEEDLE) ×4 IMPLANT
NEEDLE HYPO 22GX1.5 SAFETY (NEEDLE) ×4 IMPLANT
NS IRRIG 500ML POUR BTL (IV SOLUTION) ×3 IMPLANT
PACK LAP CHOLECYSTECTOMY (MISCELLANEOUS) ×7 IMPLANT
PAD CLEANER CAUTERY TIP 5X5 (MISCELLANEOUS)
PAD DRESSING TELFA 3X8 NADH (GAUZE/BANDAGES/DRESSINGS) IMPLANT
PENCIL ELECTRO HAND CTR (MISCELLANEOUS) ×6 IMPLANT
PENCIL SMOKE EVACUATOR COATED (MISCELLANEOUS) ×1 IMPLANT
RELOAD STAPLE 60 2.6 WHT THN (STAPLE) IMPLANT
RELOAD STAPLE 60 3.6 BLU REG (STAPLE) IMPLANT
RELOAD STAPLER BLUE 60MM (STAPLE) ×9 IMPLANT
RELOAD STAPLER WHITE 60MM (STAPLE) ×6 IMPLANT
SCISSORS METZENBAUM CVD 33 (INSTRUMENTS) ×7 IMPLANT
SET TUBE SMOKE EVAC HIGH FLOW (TUBING) ×4 IMPLANT
SLEEVE ADV FIXATION 5X100MM (TROCAR) ×3 IMPLANT
SPONGE T-LAP 18X18 ~~LOC~~+RFID (SPONGE) ×4 IMPLANT
STAPLE ECHEON FLEX 60 POW ENDO (STAPLE) ×1 IMPLANT
STAPLER RELOAD BLUE 60MM (STAPLE) ×12
STAPLER RELOAD WHITE 60MM (STAPLE) ×8
STAPLER SKIN PROX 35W (STAPLE) ×4 IMPLANT
STRIP CLOSURE SKIN 1/2X4 (GAUZE/BANDAGES/DRESSINGS) IMPLANT
SURGIFLO W/THROMBIN 8M KIT (HEMOSTASIS) ×1 IMPLANT
SUT CHROMIC 0 CT 1 (SUTURE) IMPLANT
SUT MNCRL 4-0 (SUTURE) ×2
SUT MNCRL 4-0 27XMFL (SUTURE) ×6
SUT MNCRL AB 4-0 PS2 18 (SUTURE) ×6 IMPLANT
SUT PDS AB 1 CT1 36 (SUTURE) ×2 IMPLANT
SUT PDS AB 1 TP1 54 (SUTURE) ×15 IMPLANT
SUT VIC AB 0 CT1 36 (SUTURE) ×8 IMPLANT
SUT VIC AB 1 CT1 36 (SUTURE) ×1 IMPLANT
SUT VIC AB 2-0 SH 27 (SUTURE) ×2
SUT VIC AB 2-0 SH 27XBRD (SUTURE) IMPLANT
SUT VIC AB 2-0 UR6 27 (SUTURE) IMPLANT
SUT VIC AB 3-0 SH 27 (SUTURE) ×2
SUT VIC AB 3-0 SH 27X BRD (SUTURE) IMPLANT
SUT VIC AB 4-0 FS2 27 (SUTURE) ×3 IMPLANT
SUT VICRYL 0 AB UR-6 (SUTURE) ×8 IMPLANT
SUTURE MNCRL 4-0 27XMF (SUTURE) ×3 IMPLANT
SYS BAG RETRIEVAL 10MM (BASKET) ×4
SYS KII FIOS ACCESS ABD 5X100 (TROCAR)
SYS LAPSCP GELPORT 120MM (MISCELLANEOUS) ×4
SYSTEM BAG RETRIEVAL 10MM (BASKET) ×3 IMPLANT
SYSTEM KII FIOS ACES ABD 5X100 (TROCAR) ×3 IMPLANT
SYSTEM LAPSCP GELPORT 120MM (MISCELLANEOUS) ×3 IMPLANT
TRAY FOLEY MTR SLVR 16FR STAT (SET/KITS/TRAYS/PACK) ×7 IMPLANT
TROCAR BALLN GELPORT 12X130M (ENDOMECHANICALS) ×3 IMPLANT
TROCAR ENDOPATH XCEL 12X100 BL (ENDOMECHANICALS) ×5 IMPLANT
TROCAR XCEL 12X100 BLDLESS (ENDOMECHANICALS) ×4 IMPLANT
TROCAR XCEL NON-BLD 5MMX100MML (ENDOMECHANICALS) IMPLANT
TUBING EVAC SMOKE HEATED PNEUM (TUBING) ×4 IMPLANT
WATER STERILE IRR 500ML POUR (IV SOLUTION) ×7 IMPLANT

## 2021-07-23 NOTE — Anesthesia Postprocedure Evaluation (Signed)
Anesthesia Post Note  Patient: ROALD LUKACS  Procedure(s) Performed: HAND ASSISTED LAPAROSCOPIC RADICAL NEPHRECTOMY (Left: Abdomen) APPLICATION OF CELL SAVER APPENDECTOMY LAPAROSCOPIC HAND ASSISTED TAKE DOWN OF INTESTINAL FISTULA  Patient location during evaluation: PACU Anesthesia Type: General Level of consciousness: awake and alert Pain management: pain level controlled Vital Signs Assessment: post-procedure vital signs reviewed and stable Respiratory status: spontaneous breathing, nonlabored ventilation, respiratory function stable and patient connected to nasal cannula oxygen Cardiovascular status: blood pressure returned to baseline and stable Postop Assessment: no apparent nausea or vomiting Anesthetic complications: no   No notable events documented.   Last Vitals:  Vitals:   07/23/21 1430 07/23/21 1500  BP: 138/88 (!) 154/79  Pulse: 79 79  Resp: 17 18  Temp: 36.6 C 36.5 C  SpO2: 100% 100%    Last Pain:  Vitals:   07/23/21 1500  TempSrc:   PainSc: 0-No pain                 Martha Clan

## 2021-07-23 NOTE — Op Note (Signed)
PREOP DIAGNOSIS: Left renal mass  POSTOPERATIVE DIAGNOSIS: Left renal mass  OPERATION PERFORMED: Hand-assisted laparoscopic left radical nephrectomy.   SURGEON: Hollice Espy, MD   Assistant: Nickolas Madrid, MD   ANESTHESIA: General.   ESTIMATED BLOOD LOSS: 150  cc   DRAINS: 16-French Foley catheter.   COMPLICATIONS: None.  Indications: Large left renal mass, 5.5 cm centrally located  DESCRIPTION OF OPERATION: Informed consent was obtained. The patient was marked on the left side. IV antibiotics were given for bacterial prophylaxis on call to the operating room. SCDs were provided for DVT prophylaxis. The patient was taken to the operating room and placed supine on the operating table. General anesthesia was provided. A Foley catheter was placed to drain the bladder.  The patient was positioned in right lateral decubitus with the left flank elevated about 70 degrees and the table flexed slightly. The left arm was placed in a padded airplane for support. Axillary roll was positioned. The patient was secured to the table with soft straps and then prepped and draped sterilely.   We had a time-out confirming the patient identification, planned procedure, surgical site, and all present were in agreement. All present were in agreement.  Cell Saver was present as a precaution.   A 8 cm incision was made for the hand port in the left lower quadrant. The anterior fascia was incised and elevated. The rectus belly was retracted medially and the peritoneum was incised.. The GelPort was assembled. A 12 mm trocar was placed above the umbilicus, and then the abdomen was insufflated. Laparoscopic survey revealed no abnormalities or injuries. An additional 12 mm trocar was just below  the subxiphoid. All port sites were then infiltrated with liposomal Marcaine. Zero Vicryls were placed at the 12 mm trocar sites with the Carter-Thomason device for closure at the end of the  case.   The white line of Toldt was incised. The colon was reflected from the spleen to the pelvis. Gerota's fascia was lifted up off the lower pole of the kidney and the ureter and gonadal vessels were identified. The gonadal vein was exposed and followed up to the main renal vein. The branch point of the gonadal vein and adrenal vein were exposed at the renal vein. The upper pole of the kidney was mobilized off of the quadratus lumborum and further mobilized away from the spleen. The adrenal gland was identified and spared during the upper pole dissection. The lower pole and lateral attachments were freed. The kidney was held laterally and the hilar dissection was completed.    At this point, the renal artery artery and vein were dissected en bloc  and 60 mm vascular load staple using the battery operated endovascular stapler.  There was some slight oozing from the distal aspect of the hilum and several Weck clips were applied until adequate hemostasis was achieved.  The remainder of the hilum was then cut using cold scissors.  Remaining attachments were freed.  Also included the ureter which was ligated using Weck clips x2 and inspected.  The kidney was extracted using an Endo Catch bag through the gel port and passed off for pathological analysis.    Pneumoperitoneal pressure was reduced to 7 mmHg and the abdomen was inspected; hemostasis was confirmed.  Some areas of slight oozing were fulgurated.  Additional Surgicel and Surgi-Flo were placed both in the kidney fossa particularly splenorenal ligament as well as at the hilum for additional hemostasis.  The colon was then placed back into its normal anatomic  position.  The remainder of the procedure was then completed by Dr. Hampton Abbot and his assistant.  Please see his notes for details.  They did close our 12 mm ports x2 which were briefly reclosed using Carter-Thomason device at the beginning of the procedure.  They also closed the HandPort  and liposomal Marcaine was utilized.  Please see their dictation for details.  An assistant was required for this surgical procedure. The duties of the assistant included but were not limited to suctioning, passing suture, camera manipulation, retraction. This procedure would not be able to be performed without an assistant.   ______________________________    Hollice Espy, MD

## 2021-07-23 NOTE — Op Note (Addendum)
Procedure Date:  07/23/2021  Pre-operative Diagnosis:  Appendiceal mass with coloappendiceal fistula  Post-operative Diagnosis: Appendiceal mass with coloappendiceal fistula  Procedure:  Laparoscopic hand-assisted appendectomy with takedown of coloappendiceal fistula.  Surgeon:  [name], MD  Assistant:  Edison Simon, PA-C  Anesthesia:  General endotracheal  Estimated Blood Loss:  30 ml  Specimens:  appendix  Complications:  None  Indications for Procedure:  This is a 61 y.o. male who presents with a diagnosis of left renal cell carcinoma and an appendiceal mass with possible fistula between the appendix and the sigmoid colon.  He presents today for hand assisted left nephrectomy with Dr. Erlene Quan, and laparoscopic appendectomy with me.  The options of surgery versus observation were reviewed with the patient and/or family. The risks of bleeding, infection, recurrence of symptoms, negative laparoscopy, potential for an open procedure, bowel injury, abscess or infection, were all discussed with the patient and he was willing to proceed.  Description of Procedure: The patient was correctly identified in the preoperative area and brought into the operating room.  The patient was placed supine with VTE prophylaxis in place.  Appropriate time-outs were performed.  Anesthesia was induced and the patient was intubated.  Foley catheter was placed.  Appropriate antibiotics were infused.  The patient was then placed in a partial right lateral decubitus position.  Dr. Erlene Quan proceeded with her part of the surgery.  I came into the room at the completion of her portion.  Please see her operative note for further details.  The patient was in right lateral decubitus position and flexed.  He was repositioned to flat supine position with head down and right side up.  The hand port was used.  The terminal ileum was adhered to the low right pelvic wall and this was taken down with LigaSure.  Once  the terminal ileum was better mobilized, it allowed exposure of the appendix.  This was very scarred also to the pelvic wall, with very thick adhesions.  Combination of blunt hand dissection and LigaSure was used to dissect the appendix at its base and along its body, leading to the sigmoid colon.  This took at least 60 minutes of tedious dissection, with careful attention not to dissect too deep to injure the ureter and staying away from the iliac vasculature.  The distal appendix was also heavily scarred to the sigmoid and the suspected fistula was confirmed.  After further dissection of the fistula, a blue load stapler was used to wedge out the fistula on the sigmoid colon side, doing a partial colectomy.  Then, another blue load was used to staple the appendix at its based along the cecum.  Once this was done, LigaSure was then used to dissect across the mesoappendix.  The appendix was then removed via the hand port.  After that, the pelvis was irrigated and hemostasis was good.  It was decided to reinforce the staple line at the sigmoid by using 2-0 Silk sutures in [place] fashion.  After that, the area was irrigated again and VistaSeal was used to help with further hemostasis, and it was sprayed over the raw surfaces and both staple lines.  Following that, the ports were removed under direct visualization and pneumoperitoneum was released.  The hand port was removed.  60 ml of Exparel solution combined with 0.5% bupivacaine with epi and saline were infiltrated over the peritoneum, fascia, and subcutaneous tissue.  The peritoneum of the hand port incision was closed using 2-0 Vicryl suture, and the  anterior layers of the fascia were closed using #1 PDS suture.  Following that, the hand port incision was irrigated and then closed in layers using 2-0 Vicryl, 3-0 Vicryl, and 4-0 Monocryl.  The other ports were closed with 4-0 Monocryl.  The wounds were cleaned and sealed with DermaBond.  Foley catheter was  kept in place and the patient was emerged from anesthesia and extubated and brought to the recovery room for further management.  Mr. Olean Ree was present for the entirety of our portion of the case, and was involved in holding camera, retraction, exposure, and closure of the incisions.  The patient tolerated the procedure well and all counts were correct at the end of the case.   [name], MD

## 2021-07-23 NOTE — Anesthesia Preprocedure Evaluation (Addendum)
Anesthesia Evaluation  Patient identified by MRN, date of birth, ID band Patient awake    Reviewed: Allergy & Precautions, NPO status , Patient's Chart, lab work & pertinent test results  History of Anesthesia Complications Negative for: history of anesthetic complications  Airway Mallampati: IV   Neck ROM: Full    Dental  (+) Missing   Pulmonary neg pulmonary ROS,    Pulmonary exam normal breath sounds clear to auscultation       Cardiovascular hypertension, Normal cardiovascular exam Rhythm:Regular Rate:Normal  ECG 07/18/21: normal   Neuro/Psych negative neurological ROS     GI/Hepatic hiatal hernia,   Endo/Other  negative endocrine ROS  Renal/GU negative Renal ROS   Prostate CA    Musculoskeletal   Abdominal   Peds  Hematology  (+) JEHOVAH'S WITNESS (albumin and cell saver acceptable)  Anesthesia Other Findings   Reproductive/Obstetrics                            Anesthesia Physical Anesthesia Plan  ASA: 2  Anesthesia Plan: General   Post-op Pain Management:    Induction: Intravenous  PONV Risk Score and Plan: 2 and Ondansetron, Dexamethasone and Treatment may vary due to age or medical condition  Airway Management Planned: Oral ETT  Additional Equipment:   Intra-op Plan:   Post-operative Plan: Extubation in OR  Informed Consent: I have reviewed the patients History and Physical, chart, labs and discussed the procedure including the risks, benefits and alternatives for the proposed anesthesia with the patient or authorized representative who has indicated his/her understanding and acceptance.     Dental advisory given  Plan Discussed with: CRNA  Anesthesia Plan Comments: (Patient consented for risks of anesthesia including but not limited to:  - adverse reactions to medications - damage to eyes, teeth, lips or other oral mucosa - nerve damage due to positioning   - sore throat or hoarseness - damage to heart, brain, nerves, lungs, other parts of body or loss of life  Informed patient about role of CRNA in peri- and intra-operative care.  Patient voiced understanding.)        Anesthesia Quick Evaluation

## 2021-07-23 NOTE — Interval H&P Note (Signed)
History and Physical Interval Note:  07/23/2021 7:51 AM  Patrick Pittman  has presented today for surgery, with the diagnosis of Left Kidney Mass.  The various methods of treatment have been discussed with the patient and family. After consideration of risks, benefits and other options for treatment, the patient has consented to  Procedure(s): HAND ASSISTED LAPAROSCOPIC RADICAL NEPHRECTOMY (Left) APPLICATION OF CELL SAVER (N/A) APPENDECTOMY LAPAROSCOPIC (N/A) as a surgical intervention.  The patient's history has been reviewed, patient examined, no change in status, stable for surgery.  I have reviewed the patient's chart and labs.  Questions were answered to the patient's satisfaction.     Sameka Bagent

## 2021-07-23 NOTE — Progress Notes (Signed)
Patient awake/alert, very sleepy, states he did not sleep last pm.  Easily aroused, oriented, reviewed procedure and plan of care with patient. Tolerated po fluids in pacu.  Afebrile   Urine amber to yellow/clear.

## 2021-07-23 NOTE — Transfer of Care (Signed)
Immediate Anesthesia Transfer of Care Note  Patient: Patrick Pittman  Procedure(s) Performed: HAND ASSISTED LAPAROSCOPIC RADICAL NEPHRECTOMY (Left: Abdomen) APPLICATION OF CELL SAVER APPENDECTOMY LAPAROSCOPIC HAND ASSISTED TAKE DOWN OF INTESTINAL FISTULA  Patient Location: PACU  Anesthesia Type:General  Level of Consciousness: drowsy and patient cooperative  Airway & Oxygen Therapy: Patient Spontanous Breathing and Patient connected to face mask oxygen  Post-op Assessment: Report given to RN and Post -op Vital signs reviewed and stable  Post vital signs: Reviewed and stable  Last Vitals:  Vitals Value Taken Time  BP    Temp    Pulse 79 07/23/21 1354  Resp 17 07/23/21 1354  SpO2 100 % 07/23/21 1354  Vitals shown include unvalidated device data.  Last Pain:  Vitals:   07/23/21 0642  TempSrc: Oral  PainSc: 0-No pain         Complications: No notable events documented.
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
## 2021-07-23 NOTE — Anesthesia Procedure Notes (Signed)
Procedure Name: Intubation Date/Time: 07/23/2021 8:24 AM  Performed by: Kelton Pillar, CRNAPre-anesthesia Checklist: Patient identified, Emergency Drugs available, Suction available and Patient being monitored Patient Re-evaluated:Patient Re-evaluated prior to induction Oxygen Delivery Method: Circle system utilized Preoxygenation: Pre-oxygenation with 100% oxygen Induction Type: IV induction Ventilation: Mask ventilation without difficulty Laryngoscope Size: McGraph and 3 Grade View: Grade I Tube type: Oral Tube size: 7.0 mm Number of attempts: 1 Airway Equipment and Method: Stylet and Oral airway Placement Confirmation: ETT inserted through vocal cords under direct vision, positive ETCO2, breath sounds checked- equal and bilateral and CO2 detector Secured at: 21 cm Tube secured with: Tape Dental Injury: Teeth and Oropharynx as per pre-operative assessment

## 2021-07-24 ENCOUNTER — Encounter: Payer: Self-pay | Admitting: Urology

## 2021-07-24 LAB — BASIC METABOLIC PANEL
Anion gap: 5 (ref 5–15)
BUN: 20 mg/dL (ref 6–20)
CO2: 22 mmol/L (ref 22–32)
Calcium: 8.4 mg/dL — ABNORMAL LOW (ref 8.9–10.3)
Chloride: 113 mmol/L — ABNORMAL HIGH (ref 98–111)
Creatinine, Ser: 1.96 mg/dL — ABNORMAL HIGH (ref 0.61–1.24)
GFR, Estimated: 38 mL/min — ABNORMAL LOW (ref >60)
Glucose, Bld: 132 mg/dL — ABNORMAL HIGH (ref 70–99)
Potassium: 3.5 mmol/L (ref 3.5–5.1)
Sodium: 140 mmol/L (ref 135–145)

## 2021-07-24 LAB — CBC
HCT: 41.4 % (ref 39.0–52.0)
Hemoglobin: 13.7 g/dL (ref 13.0–17.0)
MCH: 28.1 pg (ref 26.0–34.0)
MCHC: 33.1 g/dL (ref 30.0–36.0)
MCV: 84.8 fL (ref 80.0–100.0)
Platelets: 233 10*3/uL (ref 150–400)
RBC: 4.88 MIL/uL (ref 4.22–5.81)
RDW: 14.6 % (ref 11.5–15.5)
WBC: 8.5 10*3/uL (ref 4.0–10.5)
nRBC: 0 % (ref 0.0–0.2)

## 2021-07-24 NOTE — Plan of Care (Signed)
Pt AAOxr, moderate to severe abdominal pain and distention. Plan for pain control and mobility as tolerated. VS are stable. Bed is in lowest position, call light within reach. Will continue to monitor.

## 2021-07-24 NOTE — Progress Notes (Signed)
[place] Hospital Day(s): 1.   Post op day(s): 1 Day Post-Op.   Interval History:  Patient seen and examined No acute events or new complaints overnight.  Patient reports more incisional soreness in the central abdomen; less sore in LLQ Some distension as well No fever, chills, nausea, emesis He is without leukocytosis this morning; 8.5K Slight bump in renal function; sCr - 1.96; UO - 895 ccs He is on CLD No flatus  Vital signs in last 24 hours: [min-max] current  Temp:  [97.7 F (36.5 C)-98.8 F (37.1 C)] 98.5 F (36.9 C) (07/18 0548) Pulse Rate:  [67-88] 84 (07/18 0548) Resp:  [14-18] 18 (07/18 0548) BP: (129-159)/(79-100) 152/94 (07/18 0548) SpO2:  [100 %] 100 % (07/18 0548)     Height: '5\' 10"'$  (177.8 cm) Weight: 104.8 kg BMI (Calculated): 33.15   Intake/Output last 2 shifts:  07/17 0701 - 07/18 0700 In: 2529.9 [P.O.:100; I.V.:2300; IV Piggyback:129.9] Out: 1075 [Urine:895; Blood:180]   Physical Exam:  Constitutional: alert, cooperative and no distress  Respiratory: breathing non-labored at rest  Cardiovascular: regular rate and sinus rhythm  Gastrointestinal: Soft, central tenderness near laparoscopic sites,grossly distended, no rebound/guarding.  Genitourinary: Foley in place  Integumentary: Mini-laparotomy in LLQ and laparoscopic sites are CDI with dermabond, no erythema or drainage   Labs:     Latest Ref Rng & Units 07/24/2021    4:58 AM 07/18/2021    3:40 PM  CBC  WBC 4.0 - 10.5 K/uL 8.5  4.4   Hemoglobin 13.0 - 17.0 g/dL 13.7  15.9   Hematocrit 39.0 - 52.0 % 41.4  47.2   Platelets 150 - 400 K/uL 233  273       Latest Ref Rng & Units 07/24/2021    4:58 AM 07/18/2021    3:40 PM 04/17/2021    8:02 AM  CMP  Glucose 70 - 99 mg/dL 132  130    BUN 6 - 20 mg/dL 20  14    Creatinine 0.61 - 1.24 mg/dL 1.96  1.14  1.30   Sodium 135 - 145 mmol/L 140  140    Potassium 3.5 - 5.1 mmol/L 3.5  3.4    Chloride 98 - 111 mmol/L  113  108    CO2 22 - 32 mmol/L 22  25    Calcium 8.9 - 10.3 mg/dL 8.4  9.4      Imaging studies: No new pertinent imaging studies   Assessment/Plan:  61 y.o. male with abdominal distension and lack of bowel function, ? Ileus, 1 Day Post-Op s/p hand assisted left nephrectomy, appendectomy, and repair of coloappendiceal fistula.   - Continue CLD today given distension and pain; awaiting ROBF before advancing  - Foley per primary team   - Monitor abdominal examination; on-going bowel function - Pain control prn; antiemetics prn   - Monitor renal function - Mobilization as tolerated; reviewed lifting restrictions - Further management per primary service  - Discharge Planning; Await ROBF. Certainly not ready today given pain, distension, lack of flatus    All of the above findings and recommendations were discussed with the patient, and the medical team, and all of patient's questions were answered to his expressed satisfaction.  -- Edison Simon, PA-C [facility] 07/24/2021, 7:30 AM M-F: 7am - 4pm

## 2021-07-24 NOTE — TOC Initial Note (Signed)
Transition of Care [facility]) - Initial/Assessment Note    Patient Details  Name: Patrick Pittman MRN: [id number] Date of Birth: 24-Mar-1960  Transition of Care [facility]) CM/SW Contact:    Beverly Sessions, RN Phone Number: 07/24/2021, 4:25 PM  Clinical Narrative:                     Transition of Care [facility]) Screening Note   Patient Details  Name: Patrick Pittman Date of Birth: 21-Feb-1960   Transition of Care [facility]) CM/SW Contact:    Beverly Sessions, RN Phone Number: 07/24/2021, 4:25 PM    Transition of Care Department [facility]) has reviewed patient and no TOC needs have been identified at this time. We will continue to monitor patient advancement through interdisciplinary progression rounds. If new patient transition needs arise, please place a TOC consult.       Patient Goals and CMS Choice        Expected Discharge Plan and Services                                                Prior Living Arrangements/Services                       Activities of Daily Living      Permission Sought/Granted                  Emotional Assessment              Admission diagnosis:  Left renal mass [N28.89] Patient Active Problem List   Diagnosis Date Noted   Left renal mass 07/23/2021   Mass of appendix    Fistula of appendix    Genetic testing 05/28/2021   PCP:  Theotis Burrow, MD Pharmacy:   [facility] [place], [place] - [place] [facility] [address] [place] [address] Phone: [phone] Fax: [phone]     Social Determinants of Health (SDOH) Interventions    Readmission Risk Interventions     No data to display

## 2021-07-24 NOTE — Progress Notes (Signed)
Upon rounding, day nurse asked patient if he had voided and he stated that he had voided twice.

## 2021-07-24 NOTE — Progress Notes (Signed)
Urology Inpatient Progress Note  Subjective: No acute events overnight.  He is afebrile, VSS. Creatinine up today, 1.96.  WBC count up today, 8.5 hemoglobin down, 13.7. Today he is primarily reporting uncomfortable abdominal distention.  He remains on a clear liquid diet.  895 mL UOP in the postop period, Foley in place draining clear yellow urine.  Anti-infectives: Anti-infectives (From admission, onward)    Start     Dose/Rate Route Frequency Ordered Stop   07/23/21 1545  ceFAZolin (ANCEF) IVPB 1 g/50 mL premix        1 g 100 mL/hr over 30 Minutes Intravenous Every 8 hours 07/23/21 1454 07/23/21 2259   07/23/21 0632  ceFAZolin (ANCEF) 2-4 GM/100ML-% IVPB       Note to Pharmacy: Sylvester Harder P: cabinet override      07/23/21 0632 07/23/21 0853   07/23/21 0600  ceFAZolin (ANCEF) IVPB 2g/100 mL premix  Status:  Discontinued        2 g 200 mL/hr over 30 Minutes Intravenous On call to O.R. 07/23/21 0121 07/23/21 0122   07/23/21 0121  ceFAZolin (ANCEF) IVPB 2g/100 mL premix        2 g 200 mL/hr over 30 Minutes Intravenous 30 min pre-op 07/23/21 0121 07/23/21 1155       Current Facility-Administered Medications  Medication Dose Route Frequency Provider Last Rate Last Admin   0.9 %  sodium chloride infusion   Intravenous Continuous Hollice Espy, MD 125 mL/hr at 07/24/21 0540 New Bag at 07/24/21 0540   acetaminophen (TYLENOL) tablet 650 mg  650 mg Oral Q4H PRN Hollice Espy, MD       diphenhydrAMINE (BENADRYL) injection 12.5 mg  12.5 mg Intravenous Q6H PRN Hollice Espy, MD       Or   diphenhydrAMINE (BENADRYL) 12.5 MG/5ML elixir 12.5 mg  12.5 mg Oral Q6H PRN Hollice Espy, MD       docusate sodium (COLACE) capsule 100 mg  100 mg Oral BID Hollice Espy, MD   100 mg at 07/23/21 2204   heparin injection 5,000 Units  5,000 Units Subcutaneous Q8H Hollice Espy, MD   5,000 Units at 07/23/21 2203   irbesartan (AVAPRO) tablet 75 mg  75 mg Oral Daily Darrick Penna, RPH        And   hydrochlorothiazide (HYDRODIURIL) tablet 12.5 mg  12.5 mg Oral Daily Darrick Penna, RPH       loratadine (CLARITIN) tablet 10 mg  10 mg Oral Daily Hollice Espy, MD       morphine (PF) 2 MG/ML injection 2-4 mg  2-4 mg Intravenous Q2H PRN Hollice Espy, MD   2 mg at 07/24/21 0538   ondansetron (ZOFRAN) injection 4 mg  4 mg Intravenous Q4H PRN Hollice Espy, MD       oxybutynin (DITROPAN) tablet 5 mg  5 mg Oral Q8H PRN Hollice Espy, MD       oxybutynin (DITROPAN-XL) 24 hr tablet 10 mg  10 mg Oral Daily Hollice Espy, MD       oxyCODONE-acetaminophen (PERCOCET/ROXICET) 5-325 MG per tablet 1-2 tablet  1-2 tablet Oral Q4H PRN Hollice Espy, MD       Objective: Vital signs in last 24 hours: Temp:  [97.7 F (36.5 C)-98.8 F (37.1 C)] 98.5 F (36.9 C) (07/18 0746) Pulse Rate:  [67-88] 84 (07/18 0746) Resp:  [14-20] 20 (07/18 0746) BP: (129-159)/(79-100) 142/92 (07/18 0746) SpO2:  [99 %-100 %] 99 % (07/18 0746)  Intake/Output from previous day: 07/17 0701 -  07/18 0700 In: 2529.9 [P.O.:100; I.V.:2300; IV Piggyback:129.9] Out: 1075 [Urine:895; Blood:180] Intake/Output this shift: No intake/output data recorded.  Physical Exam Vitals and nursing note reviewed.  Constitutional:      General: He is not in acute distress.    Appearance: He is not ill-appearing, toxic-appearing or diaphoretic.  Pulmonary:     Effort: Pulmonary effort is normal. No respiratory distress.  Abdominal:     General: There is distension.     Tenderness: There is abdominal tenderness.  Skin:    General: Skin is warm and dry.  Neurological:     Mental Status: He is alert and oriented to person, place, and time.  Psychiatric:        Mood and Affect: Mood normal.        Behavior: Behavior normal.    Lab Results:  Recent Labs    07/24/21 0458  WBC 8.5  HGB 13.7  HCT 41.4  PLT 233   BMET Recent Labs    07/24/21 0458  NA 140  K 3.5  CL 113*  CO2 22  GLUCOSE 132*  BUN 20   CREATININE 1.96*  CALCIUM 8.4*   Assessment & Plan: 61 year old male POD 1 from combined laparoscopic left radical nephrectomy with Dr. Erlene Quan for management of a left renal mass and laparoscopic appendectomy with takedown of [place] appendiceal fistula with Dr. Hampton Abbot for management of appendiceal mass with coloappendiceal fistula.  Normal postoperative changes on a.m. labs.  He continues to produce adequate urine.  We will plan to keep him on a clear liquid diet for now given significant abdominal distention.  Plan: -Discontinue Foley catheter -Continue IV fluids -Repeat labs a.m. -Diet orders per general surgery, clear liquids for now -Ambulate today  Debroah Loop, PA-C 07/24/2021

## 2021-07-25 DIAGNOSIS — C7A8 Other malignant neuroendocrine tumors: Secondary | ICD-10-CM

## 2021-07-25 DIAGNOSIS — K388 Other specified diseases of appendix: Secondary | ICD-10-CM

## 2021-07-25 LAB — BASIC METABOLIC PANEL
Anion gap: 7 (ref 5–15)
BUN: 19 mg/dL (ref 6–20)
CO2: 22 mmol/L (ref 22–32)
Calcium: 8.5 mg/dL — ABNORMAL LOW (ref 8.9–10.3)
Chloride: 109 mmol/L (ref 98–111)
Creatinine, Ser: 2.01 mg/dL — ABNORMAL HIGH (ref 0.61–1.24)
GFR, Estimated: 37 mL/min — ABNORMAL LOW (ref >60)
Glucose, Bld: 78 mg/dL (ref 70–99)
Potassium: 3.3 mmol/L — ABNORMAL LOW (ref 3.5–5.1)
Sodium: 138 mmol/L (ref 135–145)

## 2021-07-25 LAB — CBC
HCT: 39.3 % (ref 39.0–52.0)
Hemoglobin: 13.2 g/dL (ref 13.0–17.0)
MCH: 28.4 pg (ref 26.0–34.0)
MCHC: 33.6 g/dL (ref 30.0–36.0)
MCV: 84.7 fL (ref 80.0–100.0)
Platelets: 194 10*3/uL (ref 150–400)
RBC: 4.64 MIL/uL (ref 4.22–5.81)
RDW: 14.4 % (ref 11.5–15.5)
WBC: 8 10*3/uL (ref 4.0–10.5)
nRBC: 0 % (ref 0.0–0.2)

## 2021-07-25 MED ORDER — POLYETHYLENE GLYCOL 3350 17 G PO PACK
17.0000 g | PACK | Freq: Every day | ORAL | Status: DC
  Administered 2021-07-25 – 2021-08-02 (×7): 17 g via ORAL
  Filled 2021-07-25 (×9): qty 1

## 2021-07-25 MED ORDER — POTASSIUM CHLORIDE CRYS ER 20 MEQ PO TBCR
20.0000 meq | EXTENDED_RELEASE_TABLET | Freq: Once | ORAL | Status: AC
Start: 2021-07-25 — End: 2021-07-25
  Administered 2021-07-25: 20 meq via ORAL
  Filled 2021-07-25: qty 1

## 2021-07-25 NOTE — Progress Notes (Signed)
Urology Consult Follow Up  Subjective: He states that he is feeling well.  The abdominal pain has improved.  He reports belching but does not have incidence of flatus.  He is having hunger pains and has been ambulating.  He has been tolerating his clear liquid diet.  VSS afebrile.  He states he has been voiding well with a strong urinary stream and good output.  Serum creatinine 2.01 which is anticipated s/p nephrectomy.   WBC count down to 8.0.  Hbg at 13.7   Anti-infectives: Anti-infectives (From admission, onward)    Start     Dose/Rate Route Frequency Ordered Stop   07/23/21 1545  ceFAZolin (ANCEF) IVPB 1 g/50 mL premix        1 g 100 mL/hr over 30 Minutes Intravenous Every 8 hours 07/23/21 1454 07/23/21 2259   07/23/21 0632  ceFAZolin (ANCEF) 2-4 GM/100ML-% IVPB       Note to Pharmacy: Sylvester Harder P: cabinet override      07/23/21 0632 07/23/21 0853   07/23/21 0600  ceFAZolin (ANCEF) IVPB 2g/100 mL premix  Status:  Discontinued        2 g 200 mL/hr over 30 Minutes Intravenous On call to O.R. 07/23/21 0121 07/23/21 0122   07/23/21 0121  ceFAZolin (ANCEF) IVPB 2g/100 mL premix        2 g 200 mL/hr over 30 Minutes Intravenous 30 min pre-op 07/23/21 0121 07/23/21 1155       Current Facility-Administered Medications  Medication Dose Route Frequency Provider Last Rate Last Admin   0.9 %  sodium chloride infusion   Intravenous Continuous Hollice Espy, MD 125 mL/hr at 07/25/21 0306 New Bag at 07/25/21 0306   acetaminophen (TYLENOL) tablet 650 mg  650 mg Oral Q4H PRN Hollice Espy, MD       diphenhydrAMINE (BENADRYL) injection 12.5 mg  12.5 mg Intravenous Q6H PRN Hollice Espy, MD       Or   diphenhydrAMINE (BENADRYL) 12.5 MG/5ML elixir 12.5 mg  12.5 mg Oral Q6H PRN Hollice Espy, MD       docusate sodium (COLACE) capsule 100 mg  100 mg Oral BID Hollice Espy, MD   100 mg at 07/24/21 2003   heparin injection 5,000 Units  5,000 Units Subcutaneous Q8H Hollice Espy, MD    5,000 Units at 07/25/21 0504   irbesartan (AVAPRO) tablet 75 mg  75 mg Oral Daily Darrick Penna, RPH   75 mg at 07/24/21 0941   And   hydrochlorothiazide (HYDRODIURIL) tablet 12.5 mg  12.5 mg Oral Daily Darrick Penna, RPH   12.5 mg at 07/24/21 0941   loratadine (CLARITIN) tablet 10 mg  10 mg Oral Daily Hollice Espy, MD   10 mg at 07/24/21 0941   morphine (PF) 2 MG/ML injection 2-4 mg  2-4 mg Intravenous Q2H PRN Hollice Espy, MD   2 mg at 07/25/21 0030   ondansetron (ZOFRAN) injection 4 mg  4 mg Intravenous Q4H PRN Hollice Espy, MD       oxybutynin (DITROPAN) tablet 5 mg  5 mg Oral Q8H PRN Hollice Espy, MD       oxybutynin (DITROPAN-XL) 24 hr tablet 10 mg  10 mg Oral Daily Hollice Espy, MD   10 mg at 07/24/21 0941   oxyCODONE-acetaminophen (PERCOCET/ROXICET) 5-325 MG per tablet 1-2 tablet  1-2 tablet Oral Q4H PRN Hollice Espy, MD   2 tablet at 07/24/21 1709   polyethylene glycol (MIRALAX / GLYCOLAX) packet 17 g  17 g Oral  Daily Piscoya, Jose, MD         Objective: Vital signs in last 24 hours: Temp:  [98.3 F (36.8 C)-99.3 F (37.4 C)] 99.3 F (37.4 C) (07/19 0844) Pulse Rate:  [87-92] 92 (07/19 0844) Resp:  [18-20] 19 (07/19 0844) BP: (146-153)/(87-96) 153/96 (07/19 0844) SpO2:  [97 %-99 %] 99 % (07/19 0844)  Intake/Output from previous day: 07/18 0701 - 07/19 0700 In: 240 [P.O.:240] Out: -  Intake/Output this shift: No intake/output data recorded.   Physical Exam Constitutional:  Well nourished. Alert and oriented, No acute distress. HEENT:  AT, moist mucus membranes.  Trachea midline, no masses. Cardiovascular: No clubbing, cyanosis, or edema. Respiratory: Normal respiratory effort, no increased work of breathing. GI:  Soft, mild distended, non-tender.  Incisions clean and dry. Neurologic: Grossly intact, no focal deficits, moving all 4 extremities. Psychiatric: Normal mood and affect.    Lab Results:  Recent Labs    07/24/21 0458  07/25/21 0447  WBC 8.5 8.0  HGB 13.7 13.2  HCT 41.4 39.3  PLT 233 194   BMET Recent Labs    07/24/21 0458 07/25/21 0447  NA 140 138  K 3.5 3.3*  CL 113* 109  CO2 22 22  GLUCOSE 132* 78  BUN 20 19  CREATININE 1.96* 2.01*  CALCIUM 8.4* 8.5*   PT/INR No results for input(s): "LABPROT", "INR" in the last 72 hours. ABG No results for input(s): "PHART", "HCO3" in the last 72 hours.  Invalid input(s): "PCO2", "PO2"  Studies/Results: No results found.   Assessment and Plan: 61 year old male postop day 2 from combined laparoscopic left radical nephrectomy with Dr. Erlene Quan for management of left renal mass and laparoscopic appendectomy with takedown of [place] appendiceal fistula with Dr. Hampton Abbot for management of appendiceal mass with coloappendiceal fistula.    Normal postoperative changes with morning labs.  Voiding well.  Diet advanced to full liquids.  Recommendations: -Diet orders per general surgery, advanced to full liquids this a.m. -Continue ambulation as tolerated -Adding MiraLAX per general surgery orders    LOS: 2 days    [facility] 07/25/2021

## 2021-07-25 NOTE — Progress Notes (Signed)
07/25/2021  Subjective: Patient is 2 Days Post-Op s/p lap hand assisted left nephrectomy and appendectomy, with wedge resection of sigmoid due to colo-appendiceal fistula.  Patient feels better today.  Reports less pain, less distention.  He's been ambulating.  Denies flatus, he's burping, but denies any nausea with the clear liquids.  Vital signs: Temp:  [98.3 F (36.8 C)-98.7 F (37.1 C)] 98.7 F (37.1 C) (07/18 2012) Pulse Rate:  [84-91] 90 (07/19 0302) Resp:  [18-20] 18 (07/19 0302) BP: (142-152)/(87-96) 146/93 (07/19 0302) SpO2:  [97 %-99 %] 97 % (07/19 0302)   Intake/Output: 07/18 0701 - 07/19 0700 In: 240 [P.O.:240] Out: -  Last BM Date : 07/22/21  Physical Exam: Constitutional:  No acute distress Abdomen:  soft, mildly distended, much improved tenderness and now is mostly sore to palpation.  Incisions clean, dry, intact.  Labs:  Recent Labs    07/24/21 0458 07/25/21 0447  WBC 8.5 8.0  HGB 13.7 13.2  HCT 41.4 39.3  PLT 233 194   Recent Labs    07/24/21 0458 07/25/21 0447  NA 140 138  K 3.5 3.3*  CL 113* 109  CO2 22 22  GLUCOSE 132* 78  BUN 20 19  CREATININE 1.96* 2.01*  CALCIUM 8.4* 8.5*   No results for input(s): "LABPROT", "INR" in the last 72 hours.  Imaging: No results found.  Assessment/Plan: This is a 61 y.o. 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  --Given improving patient's clinical picture, will advance diet to full liquids this morning.  No flatus yet but patient feeling much better.  Will add MiraLax as well. --Continue ambulating as tolerated.   [name], [facility]

## 2021-07-26 ENCOUNTER — Inpatient Hospital Stay: Payer: BC Managed Care – PPO

## 2021-07-26 LAB — BASIC METABOLIC PANEL
Anion gap: 11 (ref 5–15)
BUN: 20 mg/dL (ref 6–20)
CO2: 25 mmol/L (ref 22–32)
Calcium: 9.7 mg/dL (ref 8.9–10.3)
Chloride: 103 mmol/L (ref 98–111)
Creatinine, Ser: 2.01 mg/dL — ABNORMAL HIGH (ref 0.61–1.24)
GFR, Estimated: 37 mL/min — ABNORMAL LOW (ref >60)
Glucose, Bld: 101 mg/dL — ABNORMAL HIGH (ref 70–99)
Potassium: 3.3 mmol/L — ABNORMAL LOW (ref 3.5–5.1)
Sodium: 139 mmol/L (ref 135–145)

## 2021-07-26 LAB — CBC
HCT: 48.8 % (ref 39.0–52.0)
Hemoglobin: 16.4 g/dL (ref 13.0–17.0)
MCH: 27.9 pg (ref 26.0–34.0)
MCHC: 33.6 g/dL (ref 30.0–36.0)
MCV: 83 fL (ref 80.0–100.0)
Platelets: 253 10*3/uL (ref 150–400)
RBC: 5.88 MIL/uL — ABNORMAL HIGH (ref 4.22–5.81)
RDW: 14.2 % (ref 11.5–15.5)
WBC: 10 10*3/uL (ref 4.0–10.5)
nRBC: 0 % (ref 0.0–0.2)

## 2021-07-26 NOTE — Progress Notes (Signed)
Urology Inpatient Progress Note  Subjective: He is afebrile, VSS.  He had 1 episode of emesis overnight.  A.m. labs have been ordered. He has been ambulatory but has not yet passed flatus or had a bowel movement.  His abdomen remains significantly distended, though his tenderness has improved.  He remains on a full liquid diet per general surgery. He continues to void spontaneously without difficulty.  He denies gross hematuria.  Anti-infectives: Anti-infectives (From admission, onward)    Start     Dose/Rate Route Frequency Ordered Stop   07/23/21 1545  ceFAZolin (ANCEF) IVPB 1 g/50 mL premix        1 g 100 mL/hr over 30 Minutes Intravenous Every 8 hours 07/23/21 1454 07/23/21 2259   07/23/21 0632  ceFAZolin (ANCEF) 2-4 GM/100ML-% IVPB       Note to Pharmacy: Sylvester Harder P: cabinet override      07/23/21 0632 07/23/21 0853   07/23/21 0600  ceFAZolin (ANCEF) IVPB 2g/100 mL premix  Status:  Discontinued        2 g 200 mL/hr over 30 Minutes Intravenous On call to O.R. 07/23/21 0121 07/23/21 0122   07/23/21 0121  ceFAZolin (ANCEF) IVPB 2g/100 mL premix        2 g 200 mL/hr over 30 Minutes Intravenous 30 min pre-op 07/23/21 0121 07/23/21 1155       Current Facility-Administered Medications  Medication Dose Route Frequency Provider Last Rate Last Admin   0.9 %  sodium chloride infusion   Intravenous Continuous Hollice Espy, MD 125 mL/hr at 07/25/21 0306 New Bag at 07/25/21 0306   acetaminophen (TYLENOL) tablet 650 mg  650 mg Oral Q4H PRN Hollice Espy, MD       diphenhydrAMINE (BENADRYL) injection 12.5 mg  12.5 mg Intravenous Q6H PRN Hollice Espy, MD       Or   diphenhydrAMINE (BENADRYL) 12.5 MG/5ML elixir 12.5 mg  12.5 mg Oral Q6H PRN Hollice Espy, MD       docusate sodium (COLACE) capsule 100 mg  100 mg Oral BID Hollice Espy, MD   100 mg at 07/25/21 2045   heparin injection 5,000 Units  5,000 Units Subcutaneous Q8H Hollice Espy, MD   5,000 Units at 07/25/21 2045    irbesartan (AVAPRO) tablet 75 mg  75 mg Oral Daily Darrick Penna, RPH   75 mg at 07/25/21 6195   And   hydrochlorothiazide (HYDRODIURIL) tablet 12.5 mg  12.5 mg Oral Daily Darrick Penna, RPH   12.5 mg at 07/25/21 0935   loratadine (CLARITIN) tablet 10 mg  10 mg Oral Daily Hollice Espy, MD   10 mg at 07/25/21 0931   morphine (PF) 2 MG/ML injection 2-4 mg  2-4 mg Intravenous Q2H PRN Hollice Espy, MD   2 mg at 07/25/21 0030   ondansetron (ZOFRAN) injection 4 mg  4 mg Intravenous Q4H PRN Hollice Espy, MD       oxybutynin (DITROPAN) tablet 5 mg  5 mg Oral Q8H PRN Hollice Espy, MD       oxybutynin (DITROPAN-XL) 24 hr tablet 10 mg  10 mg Oral Daily Hollice Espy, MD   10 mg at 07/25/21 0933   oxyCODONE-acetaminophen (PERCOCET/ROXICET) 5-325 MG per tablet 1-2 tablet  1-2 tablet Oral Q4H PRN Hollice Espy, MD   2 tablet at 07/24/21 1709   polyethylene glycol (MIRALAX / GLYCOLAX) packet 17 g  17 g Oral Daily Olean Ree, MD   17 g at 07/25/21 0933     Objective:  Vital signs in last 24 hours: Temp:  [98.6 F (37 C)-99.1 F (37.3 C)] 98.6 F (37 C) (07/20 0449) Pulse Rate:  [92-106] 106 (07/20 0449) Resp:  [19-20] 20 (07/20 0449) BP: (136-146)/(97-98) 136/98 (07/20 0449) SpO2:  [97 %-100 %] 97 % (07/20 0449)  Intake/Output from previous day: 07/19 0701 - 07/20 0700 In: 1380 [P.O.:1380] Out: -  Intake/Output this shift: No intake/output data recorded.  Physical Exam Vitals and nursing note reviewed.  Constitutional:      General: He is not in acute distress.    Appearance: He is not ill-appearing, toxic-appearing or diaphoretic.  HENT:     Head: Normocephalic and atraumatic.  Pulmonary:     Effort: Pulmonary effort is normal. No respiratory distress.  Abdominal:     General: There is distension.     Tenderness: There is no abdominal tenderness. There is no guarding or rebound.  Skin:    General: Skin is warm and dry.  Neurological:     Mental Status: He is  alert and oriented to person, place, and time.  Psychiatric:        Mood and Affect: Mood normal.        Behavior: Behavior normal.    Lab Results:  Recent Labs    07/24/21 0458 07/25/21 0447  WBC 8.5 8.0  HGB 13.7 13.2  HCT 41.4 39.3  PLT 233 194   BMET Recent Labs    07/24/21 0458 07/25/21 0447  NA 140 138  K 3.5 3.3*  CL 113* 109  CO2 22 22  GLUCOSE 132* 78  BUN 20 19  CREATININE 1.96* 2.01*  CALCIUM 8.4* 8.5*   Assessment & Plan: 61 year old male POD 3 from combined laparoscopic left radical nephrectomy with Dr. Erlene Quan for management of a left renal mass and laparoscopic appendectomy with takedown of coloappendiceal fistula with Dr. Hampton Abbot for management of appendiceal mass with coloappendiceal fistula.  He appears well clinically and has been ambulatory, however he continues to have abdominal distention and has not yet passed flatus consistent with possible postoperative ileus.  Will defer to the general surgery team for management of this.  We will continue to monitor.  Debroah Loop, PA-C 07/26/2021

## 2021-07-26 NOTE — Progress Notes (Addendum)
[place] Hospital Day(s): 3.   Post op day(s): 3 Days Post-Op.   Interval History:  Patient seen and examined No acute events or new complaints overnight.  Patient reports he is doing well this morning However, he does report an episode of emesis overnight around 0100. Still nauseous this morning No abdominal pain but he certainly remains markedly distended No fever, chills No new labs this morning He is on FLD; eating ice pop No flatus or BM  Vital signs in last 24 hours: [min-max] current  Temp:  [98.6 F (37 C)-99.3 F (37.4 C)] 98.6 F (37 C) (07/20 0449) Pulse Rate:  [92-106] 106 (07/20 0449) Resp:  [19-20] 20 (07/20 0449) BP: (136-153)/(96-98) 136/98 (07/20 0449) SpO2:  [97 %-100 %] 97 % (07/20 0449)     Height: '5\' 10"'$  (177.8 cm) Weight: 104.8 kg BMI (Calculated): 33.15   Intake/Output last 2 shifts:  07/19 0701 - 07/20 0700 In: 1380 [P.O.:1380] Out: -    Physical Exam:  Constitutional: alert, cooperative and no distress  Respiratory: breathing non-labored at rest  Cardiovascular: regular rate and sinus rhythm  Gastrointestinal: Soft, he does not appear tender this morning, he continues to be markedly distended and tympanic, no rebound/guarding  Integumentary: Mini-laparotomy in LLQ and laparoscopic sites are CDI with dermabond, no erythema or drainage   Labs:     Latest Ref Rng & Units 07/25/2021    4:47 AM 07/24/2021    4:58 AM 07/18/2021    3:40 PM  CBC  WBC 4.0 - 10.5 K/uL 8.0  8.5  4.4   Hemoglobin 13.0 - 17.0 g/dL 13.2  13.7  15.9   Hematocrit 39.0 - 52.0 % 39.3  41.4  47.2   Platelets 150 - 400 K/uL 194  233  273       Latest Ref Rng & Units 07/25/2021    4:47 AM 07/24/2021    4:58 AM 07/18/2021    3:40 PM  CMP  Glucose 70 - 99 mg/dL 78  132  130   BUN 6 - 20 mg/dL '19  20  14   '$ Creatinine 0.61 - 1.24 mg/dL 2.01  1.96  1.14   Sodium 135 - 145 mmol/L 138  140  140   Potassium 3.5 - 5.1 mmol/L 3.3  3.5  3.4    Chloride 98 - 111 mmol/L 109  113  108   CO2 22 - 32 mmol/L '22  22  25   '$ Calcium 8.9 - 10.3 mg/dL 8.5  8.4  9.4     Imaging studies: No new pertinent imaging studies   Assessment/Plan:  61 y.o. male with persistent abdominal distension and lack of bowel function, suspect post-operative Ileus, 3 Days Post-Op s/p hand assisted left nephrectomy, appendectomy, and repair of coloappendiceal fistula.   - Reasonable to continue FLD for now; If he has recurrent emesis, would back diet down.   - If he continues to have recurrent nausea/emesis, we may need NGT decompression for symptomatic control   - Will repeat KUB this morning as a precaution  - Monitor abdominal examination; on-going bowel function - Pain control prn; antiemetics prn  - Mobilization as tolerated; reviewed lifting restrictions - Further management per primary service  - Discharge Planning: Unfortunately, he remains distended and without bowel function. Not ready for DC yet.   All of the above findings and recommendations were discussed with the patient, and the medical team, and all of patient's questions were answered to his  expressed satisfaction.  -- Edison Simon, PA-C [facility] 07/26/2021, 7:57 AM M-F: 7am - 4pm

## 2021-07-27 ENCOUNTER — Inpatient Hospital Stay: Payer: BC Managed Care – PPO

## 2021-07-27 ENCOUNTER — Inpatient Hospital Stay: Payer: Self-pay

## 2021-07-27 LAB — CBC
HCT: 45.8 % (ref 39.0–52.0)
Hemoglobin: 15.4 g/dL (ref 13.0–17.0)
MCH: 27.9 pg (ref 26.0–34.0)
MCHC: 33.6 g/dL (ref 30.0–36.0)
MCV: 83.1 fL (ref 80.0–100.0)
Platelets: 261 10*3/uL (ref 150–400)
RBC: 5.51 MIL/uL (ref 4.22–5.81)
RDW: 14.2 % (ref 11.5–15.5)
WBC: 8.6 10*3/uL (ref 4.0–10.5)
nRBC: 0 % (ref 0.0–0.2)

## 2021-07-27 LAB — BASIC METABOLIC PANEL
Anion gap: 6 (ref 5–15)
BUN: 29 mg/dL — ABNORMAL HIGH (ref 6–20)
CO2: 28 mmol/L (ref 22–32)
Calcium: 9.3 mg/dL (ref 8.9–10.3)
Chloride: 109 mmol/L (ref 98–111)
Creatinine, Ser: 2.12 mg/dL — ABNORMAL HIGH (ref 0.61–1.24)
GFR, Estimated: 35 mL/min — ABNORMAL LOW (ref >60)
Glucose, Bld: 85 mg/dL (ref 70–99)
Potassium: 3.2 mmol/L — ABNORMAL LOW (ref 3.5–5.1)
Sodium: 143 mmol/L (ref 135–145)

## 2021-07-27 LAB — HEMOGLOBIN A1C
Hgb A1c MFr Bld: 5.2 % (ref 4.8–5.6)
Mean Plasma Glucose: 102.54 mg/dL

## 2021-07-27 LAB — GLUCOSE, CAPILLARY: Glucose-Capillary: 123 mg/dL — ABNORMAL HIGH (ref 70–99)

## 2021-07-27 MED ORDER — TRAVASOL 10 % IV SOLN
INTRAVENOUS | Status: AC
  Filled 2021-07-27: qty 648

## 2021-07-27 MED ORDER — SODIUM CHLORIDE 0.9% FLUSH: 10.0000 mL | INTRAVENOUS | Status: DC | PRN

## 2021-07-27 MED ORDER — POTASSIUM CHLORIDE 10 MEQ/100ML IV SOLN
10.0000 meq | INTRAVENOUS | Status: AC
  Administered 2021-07-27 (×4): 10 meq via INTRAVENOUS
  Filled 2021-07-27 (×2): qty 100

## 2021-07-27 MED ORDER — SODIUM CHLORIDE 0.9 % IV SOLN: INTRAVENOUS | Status: AC

## 2021-07-27 MED ORDER — SODIUM CHLORIDE 0.9% FLUSH
10.0000 mL | Freq: Two times a day (BID) | INTRAVENOUS | Status: DC
  Administered 2021-07-27 – 2021-08-02 (×10): 10 mL

## 2021-07-27 MED ORDER — TRAVASOL 10 % IV SOLN: INTRAVENOUS | Status: DC

## 2021-07-27 MED ORDER — INSULIN ASPART 100 UNIT/ML IJ SOLN
0.0000 [IU] | Freq: Three times a day (TID) | INTRAMUSCULAR | Status: DC
  Administered 2021-07-27 – 2021-07-31 (×2): 1 [IU] via SUBCUTANEOUS
  Filled 2021-07-27 (×2): qty 1

## 2021-07-27 MED ORDER — CHLORHEXIDINE GLUCONATE CLOTH 2 % EX PADS
6.0000 | MEDICATED_PAD | Freq: Every day | CUTANEOUS | Status: DC
Start: 2021-07-27 — End: 2021-08-02
  Administered 2021-07-27 – 2021-08-02 (×6): 6 via TOPICAL

## 2021-07-27 NOTE — Progress Notes (Signed)
Urology Consult Follow Up  Subjective: He states he feels less distended since NG tube has been placed.  He is wanting to ambulate more today as he states he has been laying in the bed since yesterday afternoon.  VSS afebrile.  Serum creatinine 2.12 this morning which is a slight increase from yesterday of 2.01 likely due to some mild dehydration.  WBC count of 8.6 which is down from 10 yesterday.  He states he is voiding well.  He denies any flatus, but he states he does feel less distended this morning.   Anti-infectives: Anti-infectives (From admission, onward)    Start     Dose/Rate Route Frequency Ordered Stop   07/23/21 1545  ceFAZolin (ANCEF) IVPB 1 g/50 mL premix        1 g 100 mL/hr over 30 Minutes Intravenous Every 8 hours 07/23/21 1454 07/23/21 2259   07/23/21 0632  ceFAZolin (ANCEF) 2-4 GM/100ML-% IVPB       Note to Pharmacy: Sylvester Harder P: cabinet override      07/23/21 0632 07/23/21 0853   07/23/21 0600  ceFAZolin (ANCEF) IVPB 2g/100 mL premix  Status:  Discontinued        2 g 200 mL/hr over 30 Minutes Intravenous On call to O.R. 07/23/21 0121 07/23/21 0122   07/23/21 0121  ceFAZolin (ANCEF) IVPB 2g/100 mL premix        2 g 200 mL/hr over 30 Minutes Intravenous 30 min pre-op 07/23/21 0121 07/23/21 1155       Current Facility-Administered Medications  Medication Dose Route Frequency Provider Last Rate Last Admin   0.9 %  sodium chloride infusion   Intravenous Continuous Hollice Espy, MD 125 mL/hr at 07/27/21 0728 New Bag at 07/27/21 0728   acetaminophen (TYLENOL) tablet 650 mg  650 mg Oral Q4H PRN Hollice Espy, MD       diphenhydrAMINE (BENADRYL) injection 12.5 mg  12.5 mg Intravenous Q6H PRN Hollice Espy, MD       Or   diphenhydrAMINE (BENADRYL) 12.5 MG/5ML elixir 12.5 mg  12.5 mg Oral Q6H PRN Hollice Espy, MD       docusate sodium (COLACE) capsule 100 mg  100 mg Oral BID Hollice Espy, MD   100 mg at 07/26/21 2108   heparin injection 5,000 Units   5,000 Units Subcutaneous Q8H Hollice Espy, MD   5,000 Units at 07/27/21 5643   irbesartan (AVAPRO) tablet 75 mg  75 mg Oral Daily Darrick Penna, RPH   75 mg at 07/26/21 1027   And   hydrochlorothiazide (HYDRODIURIL) tablet 12.5 mg  12.5 mg Oral Daily Darrick Penna, RPH   12.5 mg at 07/26/21 1027   loratadine (CLARITIN) tablet 10 mg  10 mg Oral Daily Hollice Espy, MD   10 mg at 07/26/21 1027   morphine (PF) 2 MG/ML injection 2-4 mg  2-4 mg Intravenous Q2H PRN Hollice Espy, MD   2 mg at 07/25/21 0030   ondansetron (ZOFRAN) injection 4 mg  4 mg Intravenous Q4H PRN Hollice Espy, MD   4 mg at 07/26/21 1023   oxybutynin (DITROPAN) tablet 5 mg  5 mg Oral Q8H PRN Hollice Espy, MD       oxybutynin (DITROPAN-XL) 24 hr tablet 10 mg  10 mg Oral Daily Hollice Espy, MD   10 mg at 07/26/21 1026   oxyCODONE-acetaminophen (PERCOCET/ROXICET) 5-325 MG per tablet 1-2 tablet  1-2 tablet Oral Q4H PRN Hollice Espy, MD   2 tablet at 07/24/21 1709   polyethylene  glycol (MIRALAX / GLYCOLAX) packet 17 g  17 g Oral Daily Piscoya, Jose, MD   17 g at 07/26/21 1026     Objective: Vital signs in last 24 hours: Temp:  [97.9 F (36.6 C)-99.2 F (37.3 C)] 98.4 F (36.9 C) (07/21 0321) Pulse Rate:  [97-109] 97 (07/21 0321) Resp:  [18-20] 18 (07/21 0321) BP: (129-151)/(93-110) 129/93 (07/21 0321) SpO2:  [91 %-98 %] 96 % (07/21 0321)  Intake/Output from previous day: 07/20 0701 - 07/21 0700 In: 5000 [I.V.:5000] Out: 850 [Emesis/NG output:850] Intake/Output this shift: No intake/output data recorded.   Physical Exam Constitutional:  Well nourished. Alert and oriented, No acute distress. HEENT: [place] AT, moist mucus membranes.  Trachea midline, no masses. Cardiovascular: No clubbing, cyanosis, or edema. Respiratory: Normal respiratory effort, no increased work of breathing. GI: Abdomen is non tender, distended, incisions are clean and dry.   GU: No CVA tenderness.  No bladder fullness or  masses.    Neurologic: Grossly intact, no focal deficits, moving all 4 extremities. Psychiatric: Normal mood and affect.   Lab Results:  Recent Labs    07/26/21 0919 07/27/21 0516  WBC 10.0 8.6  HGB 16.4 15.4  HCT 48.8 45.8  PLT 253 261   BMET Recent Labs    07/26/21 0919 07/27/21 0516  NA 139 143  K 3.3* 3.2*  CL 103 109  CO2 25 28  GLUCOSE 101* 85  BUN 20 29*  CREATININE 2.01* 2.12*  CALCIUM 9.7 9.3   PT/INR No results for input(s): "LABPROT", "INR" in the last 72 hours. ABG No results for input(s): "PHART", "HCO3" in the last 72 hours.  Invalid input(s): "PCO2", "PO2"  Studies/Results: DG Abd 1 View  Result Date: 07/26/2021 CLINICAL DATA:  NG tube placement EXAM: ABDOMEN - 1 VIEW COMPARISON:  None Available. FINDINGS: Nasogastric tube coiled in the stomach. No bowel dilatation to suggest obstruction. No evidence of pneumoperitoneum, portal venous gas or pneumatosis. No pathologic calcifications along the expected course of the ureters. No acute osseous abnormality. IMPRESSION: 1. Nasogastric tube coiled in the stomach. Electronically Signed   By: Kathreen Devoid M.D.   On: 07/26/2021 13:30   DG ABD ACUTE 2+V W 1V CHEST  Result Date: 07/26/2021 CLINICAL DATA:  Abdominal pain and distention. EXAM: DG ABDOMEN ACUTE WITH 1 VIEW CHEST COMPARISON:  Previous studies including CT done on 04/17/2021 and PET-CT done on 07/12/2021 FINDINGS: There is marked distention of small-bowel loops measuring up to 6.2 cm. There are air-fluid levels in the dilated small bowel loops. There is no pneumoperitoneum. Colon is not definitely visualized. Stomach is not significantly dilated. There are linear densities in both lower lung fields suggesting subsegmental atelectasis. IMPRESSION: There is abnormal dilation of small bowel loops with air-fluid levels suggesting high-grade small bowel obstruction. There is no pneumoperitoneum. Linear densities are seen in both lower lung fields suggesting  subsegmental atelectasis. Electronically Signed   By: Elmer Picker M.D.   On: 07/26/2021 10:33     Assessment and Plan: 61 year old male postop day 4 from combined laparoscopic left radical nephrectomy with Dr. Erlene Quan for management of left renal mass and laparoscopic appendectomy with takedown of coloappendiceal fistula with Dr. Hampton Abbot for management of an appendiceal mass with coloappendiceal fistula.  Recommendations: -continue ambulation as tolerated -continue to trend BMP and CBC -defer management of postoperative ileus to general surgery team       LOS: 4 days    [facility] 07/27/2021

## 2021-07-27 NOTE — Progress Notes (Signed)
[place] [facility] Day(s): 4.   Post op day(s): 4 Days Post-Op.   Interval History:  Patient seen and examined No acute events or new complaints overnight.  Patient reports he feels better Certainly remains distended but no significant pain No fever, chills He remains without leukocytosis; CBC reassuring Renal function stable/slightly worse; sCr - 2.12; UO - unmeasured Mild hypokalemia to 3.2 NGT output in last 24 hours recorded at 850 ccs; bilious No flatus Plans to ambulate when wife arrives    Vital signs in last 24 hours: [min-max] current  Temp:  [97.9 F (36.6 C)-99.2 F (37.3 C)] 98.4 F (36.9 C) (07/21 0321) Pulse Rate:  [97-109] 97 (07/21 0321) Resp:  [18-20] 18 (07/21 0321) BP: (129-151)/(93-110) 129/93 (07/21 0321) SpO2:  [91 %-98 %] 96 % (07/21 0321)     Height: '5\' 10"'$  (177.8 cm) Weight: 104.8 kg BMI (Calculated): 33.15   Intake/Output last 2 shifts:  07/20 0701 - 07/21 0700 In: 5000 [I.V.:5000] Out: 850 [Emesis/NG output:850]   Physical Exam:  Constitutional: alert, cooperative and no distress  HEENT: NGT in place; output bilious Respiratory: breathing non-labored at rest  Cardiovascular: regular rate and sinus rhythm  Gastrointestinal: Soft, he does not appear tender this morning, he continues to be markedly distended and tympanic, no rebound/guarding  Integumentary: Mini-laparotomy in LLQ and laparoscopic sites are CDI with dermabond, no erythema or drainage   Labs:     Latest Ref Rng & Units 07/27/2021    5:16 AM 07/26/2021    9:19 AM 07/25/2021    4:47 AM  CBC  WBC 4.0 - 10.5 K/uL 8.6  10.0  8.0   Hemoglobin 13.0 - 17.0 g/dL 15.4  16.4  13.2   Hematocrit 39.0 - 52.0 % 45.8  48.8  39.3   Platelets 150 - 400 K/uL 261  253  194       Latest Ref Rng & Units 07/27/2021    5:16 AM 07/26/2021    9:19 AM 07/25/2021    4:47 AM  CMP  Glucose 70 - 99 mg/dL 85  101  78   BUN 6 - 20 mg/dL '29  20  19   '$ Creatinine  0.61 - 1.24 mg/dL 2.12  2.01  2.01   Sodium 135 - 145 mmol/L 143  139  138   Potassium 3.5 - 5.1 mmol/L 3.2  3.3  3.3   Chloride 98 - 111 mmol/L 109  103  109   CO2 22 - 32 mmol/L '28  25  22   '$ Calcium 8.9 - 10.3 mg/dL 9.3  9.7  8.5     Imaging studies:   CXR + KUB (07/27/2021) personally reviewed which continues to show persistent small bowel dilation with air fluid levels concerning for ileus, no significant distal colonic air appreciable, and radiologist report reviewed below:  IMPRESSION: 1. NG tube tip and side port project over the stomach. 2. Findings compatible with small-bowel obstruction.   Assessment/Plan:  61 y.o. male with persistent abdominal distension and lack of bowel function, post-operative Ileus, 4 Days Post-Op s/p hand assisted left nephrectomy, appendectomy, and repair of coloappendiceal fistula.   - Continue NGT decompression; LIS; monitor and record output  - NPO: He will likely benefit from TPN + PICC given prolonged NPO status and lack of improvements  - Serial KUB  - Replete K+; monitor   - Monitor renal function; sCr stable; good UO  - Monitor abdominal examination; on-going bowel function - Pain control  prn; antiemetics prn  - Mobilization as tolerated; plans to ambulate - We will be happy to assume primary  - Discharge Planning: Persistent ileus; not ready    All of the above findings and recommendations were discussed with the patient, his wife on the telephone, and the medical team, and all of patient's questions were answered to his expressed satisfaction.  -- Edison Simon, PA-C [facility] 07/27/2021, 8:26 AM M-F: 7am - 4pm

## 2021-07-27 NOTE — Progress Notes (Signed)
Initial Nutrition Assessment  DOCUMENTATION CODES:   Obesity unspecified  INTERVENTION:   TPN per pharmacy  Recommend thiamine 100mg daily added to TPN x 3 days   Pt at high refeed risk; recommend monitor potassium, magnesium and phosphorus labs daily until stable  Daily weights   NUTRITION DIAGNOSIS:   Inadequate oral intake related to acute illness as evidenced by NPO status.  GOAL:   Patient will meet greater than or equal to 90% of their needs  MONITOR:   Diet advancement, Labs, Weight trends, Skin, I & O's, Other (Comment) (TPN)  REASON FOR ASSESSMENT:   Consult New TPN/TNA  ASSESSMENT:   61 y/o male with h/o prostate cancer, HTN, hiatal hernia, coloappendiceal fistula and renal mass now s/p hand assisted left nephrectomy, appendectomy and repair of coloappendiceal fistula 7/17 complicated by post op healing.  Met with pt in room today. Pt reports good appetite and oral intake at baseline. Pt has remained on NPO/liquid diet since admission and is now without adequate nutrition for 5 days. Pt advanced to a full liquid diet 7/19 but developed vomiting. KUB from today reports multiple dilated loops of small bowel. Plan today is for PICC line and TPN. Pt is at refeed risk. RD discussed with pt the importance of adequate nutrition needed to preserve lean muscle and to support post op healing. Pt is willing to drink strawberry Ensure with diet advancement. Pt has been mobilizing today. Pt reports that he had a BM today. Pt reports incisional soreness. NGT in place to LIS with 850ml output.   Medications reviewed and include: colace, heparin, insulin, hydrochlorothiazide, miralax, NaCl @75ml/hr   Labs reviewed: K 3.2(L), creat 2.12(H)  NUTRITION - FOCUSED PHYSICAL EXAM:  Flowsheet Row Most Recent Value  Orbital Region No depletion  Upper Arm Region No depletion  Thoracic and Lumbar Region No depletion  Buccal Region No depletion  Temple Region No depletion  Clavicle  Bone Region No depletion  Clavicle and Acromion Bone Region No depletion  Scapular Bone Region No depletion  Dorsal Hand No depletion  Patellar Region No depletion  Anterior Thigh Region No depletion  Posterior Calf Region No depletion  Edema (RD Assessment) None  Hair Reviewed  Eyes Reviewed  Mouth Reviewed  Skin Reviewed  Nails Reviewed   Diet Order:   Diet Order             Diet NPO time specified Except for: Ice Chips, Sips with Meds  Diet effective now                  EDUCATION NEEDS:   Education needs have been addressed  Skin:  Skin Assessment: Reviewed RN Assessment (incision abdomen)  Last BM:  7/21- type 6  Height:   Ht Readings from Last 1 Encounters:  07/23/21 5' 10" (1.778 m)    Weight:   Wt Readings from Last 1 Encounters:  07/23/21 104.8 kg    Ideal Body Weight:  75.45 kg  BMI:  Body mass index is 33.15 kg/m.  Estimated Nutritional Needs:   Kcal:  2300-2600kcal/day  Protein:  115-130g/day  Fluid:  2.3-2.6L/day  Casey Campbell MS, RD, LDN Please refer to AMION for RD and/or RD on-call/weekend/after hours pager  

## 2021-07-27 NOTE — Progress Notes (Signed)
Peripherally Inserted Central Catheter Placement  The IV Nurse has discussed with the patient and/or persons authorized to consent for the patient, the purpose of this procedure and the potential benefits and risks involved with this procedure.  The benefits include less needle sticks, lab draws from the catheter, and the patient may be discharged home with the catheter. Risks include, but not limited to, infection, bleeding, blood clot (thrombus formation), and puncture of an artery; nerve damage and irregular heartbeat and possibility to perform a PICC exchange if needed/ordered by physician.  Alternatives to this procedure were also discussed.  Bard Power PICC patient education guide, fact sheet on infection prevention and patient information card has been provided to patient /or left at bedside.    PICC Placement Documentation  PICC Double Lumen 60/73/71 Right Basilic 40 cm 0 cm (Active)  Indication for Insertion or Continuance of Line Administration of hyperosmolar/irritating solutions (i.e. TPN, Vancomycin, etc.) 07/27/21 1105  Exposed Catheter (cm) 0 cm 07/27/21 1105  Site Assessment Clean, Dry, Intact 07/27/21 1105  Lumen #1 Status Flushed;Blood return noted;Saline locked 07/27/21 1105  Lumen #2 Status Flushed;Blood return noted;Saline locked 07/27/21 1105  Dressing Type Transparent 07/27/21 1105  Dressing Status Antimicrobial disc in place 07/27/21 1105  Dressing Change Due 08/03/21 07/27/21 1105       Patrick Pittman 07/27/2021, 11:08 AM

## 2021-07-27 NOTE — Plan of Care (Signed)

## 2021-07-27 NOTE — Consult Note (Addendum)
PHARMACY - TOTAL PARENTERAL NUTRITION CONSULT NOTE   Indication: Prolonged ileus  Patient Measurements: Height: '5\' 10"'$  (177.8 cm) Weight: 104.8 kg (231 lb) IBW/kg (Calculated) : 73 TPN AdjBW (KG): 80.9 Body mass index is 33.15 kg/m. Usual Weight: 104.8kg  Assessment: 61 y.o. male with persistent abdominal distension and lack of bowel function, post-operative Ileus  Glucose / Insulin: BG 85 mg/dL (no SSI) Electrolytes: K 3.2 (Kcl IV 32mq replacement ordered) Renal: Scr 2.12 Hepatic: Will check with AM labs tomorrow (none on file) Intake / Output; MIVF: 50021m/ 85044m+7.2L); NS @ 125 ml/hr GI Imaging: DG Abd View: Findings compatible with small-bowel obstruction GI Surgeries / Procedures: s/p hand assisted left nephrectomy, appendectomy, and repair of coloappendiceal fistula.  Central access: Pending, notified PA TPN start date: 07/27/2021  Nutritional Goals: Goal TPN rate is 100 mL/hr (provides 129.6 g of protein and 2510 kcals per day)  RD Assessment: 2300-2600kcal/day,  115-130g/day protein,  2.3-2.6L/day fluid  Current Nutrition:  NPO  Plan:  Start TPN at half rate (50 mL/hr) at 1800 Electrolytes in TPN: Na 9m70m, K 9mE57m Ca 5mEq/39mMg 5mEq/L63mnd Phos 15mmol/44ml:Ac 1:1 Add standard MVI and trace elements to TPN Add Thiamine '100mg'$  daily x 3 days Initiate Sensitive q8h SSI and adjust as needed  Reduce MIVF to 75 mL/hr at 1800 Monitor TPN labs on Mon/Thurs  AndersonDarrick Penna23,9:29 AM

## 2021-07-28 ENCOUNTER — Inpatient Hospital Stay: Payer: BC Managed Care – PPO

## 2021-07-28 LAB — PHOSPHORUS: Phosphorus: 2.3 mg/dL — ABNORMAL LOW (ref 2.5–4.6)

## 2021-07-28 LAB — COMPREHENSIVE METABOLIC PANEL
ALT: 20 U/L (ref 0–44)
AST: 23 U/L (ref 15–41)
Albumin: 3.2 g/dL — ABNORMAL LOW (ref 3.5–5.0)
Alkaline Phosphatase: 35 U/L — ABNORMAL LOW (ref 38–126)
Anion gap: 6 (ref 5–15)
BUN: 33 mg/dL — ABNORMAL HIGH (ref 6–20)
CO2: 28 mmol/L (ref 22–32)
Calcium: 8.8 mg/dL — ABNORMAL LOW (ref 8.9–10.3)
Chloride: 108 mmol/L (ref 98–111)
Creatinine, Ser: 1.94 mg/dL — ABNORMAL HIGH (ref 0.61–1.24)
GFR, Estimated: 39 mL/min — ABNORMAL LOW (ref >60)
Glucose, Bld: 110 mg/dL — ABNORMAL HIGH (ref 70–99)
Potassium: 3 mmol/L — ABNORMAL LOW (ref 3.5–5.1)
Sodium: 142 mmol/L (ref 135–145)
Total Bilirubin: 1 mg/dL (ref 0.3–1.2)
Total Protein: 7 g/dL (ref 6.5–8.1)

## 2021-07-28 LAB — CBC
HCT: 41 % (ref 39.0–52.0)
Hemoglobin: 13.7 g/dL (ref 13.0–17.0)
MCH: 27.9 pg (ref 26.0–34.0)
MCHC: 33.4 g/dL (ref 30.0–36.0)
MCV: 83.5 fL (ref 80.0–100.0)
Platelets: 252 10*3/uL (ref 150–400)
RBC: 4.91 MIL/uL (ref 4.22–5.81)
RDW: 14.1 % (ref 11.5–15.5)
WBC: 7.8 10*3/uL (ref 4.0–10.5)
nRBC: 0 % (ref 0.0–0.2)

## 2021-07-28 LAB — POTASSIUM: Potassium: 3.3 mmol/L — ABNORMAL LOW (ref 3.5–5.1)

## 2021-07-28 LAB — GLUCOSE, CAPILLARY
Glucose-Capillary: 111 mg/dL — ABNORMAL HIGH (ref 70–99)
Glucose-Capillary: 119 mg/dL — ABNORMAL HIGH (ref 70–99)
Glucose-Capillary: 114 mg/dL — ABNORMAL HIGH (ref 70–99)
Glucose-Capillary: 110 mg/dL — ABNORMAL HIGH (ref 70–99)

## 2021-07-28 LAB — TRIGLYCERIDES: Triglycerides: 155 mg/dL — ABNORMAL HIGH (ref <150)

## 2021-07-28 LAB — MAGNESIUM: Magnesium: 2.7 mg/dL — ABNORMAL HIGH (ref 1.7–2.4)

## 2021-07-28 MED ORDER — POTASSIUM CHLORIDE 10 MEQ/100ML IV SOLN
10.0000 meq | INTRAVENOUS | Status: AC
  Administered 2021-07-28 – 2021-07-29 (×2): 10 meq via INTRAVENOUS
  Filled 2021-07-28 (×2): qty 100

## 2021-07-28 MED ORDER — TRAVASOL 10 % IV SOLN
INTRAVENOUS | Status: AC
  Filled 2021-07-28: qty 1296

## 2021-07-28 MED ORDER — POTASSIUM PHOSPHATES 15 MMOLE/5ML IV SOLN
30.0000 mmol | Freq: Once | INTRAVENOUS | Status: AC
  Administered 2021-07-28: 30 mmol via INTRAVENOUS
  Filled 2021-07-28: qty 10

## 2021-07-28 MED ORDER — POTASSIUM CHLORIDE 10 MEQ/100ML IV SOLN
10.0000 meq | INTRAVENOUS | Status: AC
  Administered 2021-07-28 (×2): 10 meq via INTRAVENOUS
  Filled 2021-07-28 (×2): qty 100

## 2021-07-28 MED ORDER — POTASSIUM CHLORIDE 10 MEQ/100ML IV SOLN
10.0000 meq | INTRAVENOUS | Status: AC
  Administered 2021-07-28 (×2): 10 meq via INTRAVENOUS
  Filled 2021-07-28: qty 100

## 2021-07-28 NOTE — Progress Notes (Signed)
End of shift note:  Pt received x4 IV bags of potassium and x1 IV potassium phosphate replacement. IVF were stopped and TPN infusion rated was increased from 50 ml to 128m/hr VSS. NG tube's output was 400 ml during this shift.

## 2021-07-28 NOTE — Progress Notes (Signed)
CC: ileus Subjective: Still 1 L from the NG Ambulated Minimal pain KUB showing persistent air-fluid levels  Objective: Vital signs in last 24 hours: Temp:  [98.5 F (36.9 C)-99 F (37.2 C)] 98.5 F (36.9 C) (07/22 0734) Pulse Rate:  [82-99] 88 (07/22 0734) Resp:  [18-20] 18 (07/22 0734) BP: (128-145)/(81-94) 140/81 (07/22 0734) SpO2:  [96 %] 96 % (07/22 0734) Last BM Date : 07/28/21  Intake/Output from previous day: 07/21 0701 - 07/22 0700 In: 732.3 [I.V.:732.3] Out: 1000 [Emesis/NG output:1000] Intake/Output this shift: No intake/output data recorded.  Physical exam:  NAD alert Abd: Distended with decreased bowel sounds.  No peritonitis  Lab Results: CBC  Recent Labs    07/27/21 0516 07/28/21 0535  WBC 8.6 7.8  HGB 15.4 13.7  HCT 45.8 41.0  PLT 261 252   BMET Recent Labs    07/27/21 0516 07/28/21 0535  NA 143 142  K 3.2* 3.0*  CL 109 108  CO2 28 28  GLUCOSE 85 110*  BUN 29* 33*  CREATININE 2.12* 1.94*  CALCIUM 9.3 8.8*   PT/INR No results for input(s): "LABPROT", "INR" in the last 72 hours. ABG No results for input(s): "PHART", "HCO3" in the last 72 hours.  Invalid input(s): "PCO2", "PO2"  Studies/Results: DG ABD ACUTE 2+V W 1V CHEST  Result Date: 07/28/2021 CLINICAL DATA:  Ileus following GI surgery.  Symptoms improvement. EXAM: DG ABDOMEN ACUTE WITH 1 VIEW CHEST COMPARISON:  07/27/2021 FINDINGS: Right-sided PICC line has tip over the SVC. Enteric tube courses into the stomach with tip over the right upper quadrant likely over the distal stomach or proximal duodenum. Lungs are somewhat hypoinflated with mild left base opacification and blunting of the costophrenic angle likely atelectasis with possible small amount of pleural fluid. Cardiomediastinal silhouette and remainder of the chest is unchanged. Stable chronic changes over the right shoulder. Abdominopelvic images demonstrate minimal air within the rectosigmoid colon. There are persistent  dilated air-filled small bowel loops present measuring up to 5.6 cm in diameter. These findings are not significantly changed. Multiple scattered air-fluid levels unchanged. No evidence of free peritoneal air. Remainder of the exam is unchanged. IMPRESSION: 1. Persistent dilated air-filled small bowel loops with scattered air-fluid levels compatible with persistent postoperative ileus and less likely obstruction. 2. Minimal left base opacification likely atelectasis with possible small amount of pleural fluid. 3. Tubes and lines as described. Electronically Signed   By: Marin Olp M.D.   On: 07/28/2021 08:07   [place] EKG SITE RITE  Result Date: 07/27/2021 If Site Rite image not attached, placement could not be confirmed due to current cardiac rhythm.  DG ABD ACUTE 2+V W 1V CHEST  Result Date: 07/27/2021 CLINICAL DATA:  Follow-up ileus EXAM: DG ABDOMEN ACUTE WITH 1 VIEW CHEST COMPARISON:  Chest x-ray dated June 26, 2021 FINDINGS: NG tube tip and side port project over the stomach. Cardiac and mediastinal contours are within normal limits. Mild bibasilar atelectasis. Lungs otherwise clear. No large pleural effusion or evidence of pneumothorax. NG tube tip and side port project over the stomach. Multiple dilated loops of small bowel, similar to prior exam. Multiple air-fluid levels on upright imaging. No evidence of free air. IMPRESSION: 1. NG tube tip and side port project over the stomach. 2. Findings compatible with small-bowel obstruction. Electronically Signed   By: Yetta Glassman M.D.   On: 07/27/2021 08:47    Anti-infectives: Anti-infectives (From admission, onward)    Start     Dose/Rate Route Frequency Ordered Stop  07/23/21 1545  ceFAZolin (ANCEF) IVPB 1 g/50 mL premix        1 g 100 mL/hr over 30 Minutes Intravenous Every 8 hours 07/23/21 1454 07/23/21 2259   07/23/21 0632  ceFAZolin (ANCEF) 2-4 GM/100ML-% IVPB       Note to Pharmacy: Sylvester Harder P: cabinet override      07/23/21 0632  07/23/21 0853   07/23/21 0600  ceFAZolin (ANCEF) IVPB 2g/100 mL premix  Status:  Discontinued        2 g 200 mL/hr over 30 Minutes Intravenous On call to O.R. 07/23/21 0121 07/23/21 0122   07/23/21 0121  ceFAZolin (ANCEF) IVPB 2g/100 mL premix        2 g 200 mL/hr over 30 Minutes Intravenous 30 min pre-op 07/23/21 0121 07/23/21 1155       Assessment/Plan:  Post Operative ileus Not ready to have the tube removed Continue n.p.o. and NG tube as well as TPN he as high ng output and do another round kcl No surgical intervention  Caroleen Hamman, MD, FACS  07/28/2021

## 2021-07-28 NOTE — Consult Note (Addendum)
PHARMACY - TOTAL PARENTERAL NUTRITION CONSULT NOTE   Indication: Prolonged ileus  Patient Measurements: Height: '5\' 10"'$  (177.8 cm) Weight: 104.8 kg (231 lb) IBW/kg (Calculated) : 73 TPN AdjBW (KG): 80.9 Body mass index is 33.15 kg/m. Usual Weight: 104.8kg  Assessment: 61 y.o. male with persistent abdominal distension and lack of bowel function, post-operative Ileus  Glucose / Insulin: BG 110-120s mg/dL (on SSI) Electrolytes: K 3.2>3 (Kcl IV 67mq replacement ordered & Kphos ordered) Phos 2.3; Mg 2.7 Renal: Scr 2.12>1.94 Hepatic: LFT's WNL Intake / Output; MIVF:  (Net +7.2L); NS @ 75 ml/hr (will remove at 1800; goal rate TPN to supply daily intake requirments) GI Imaging: DG Abd View: Findings compatible with small-bowel obstruction GI Surgeries / Procedures: s/p hand assisted left nephrectomy, appendectomy, and repair of coloappendiceal fistula.  Central access: 7/21 1105, PICC  TPN start date: 07/27/2021  Nutritional Goals: Goal TPN rate is 100 mL/hr (provides 129.6 g of protein and 2510 kcals per day)  RD Assessment: 2300-2600kcal/day,  115-130g/day protein,  2.3-2.6L/day fluid  Current Nutrition:  NPO  Plan:  Increase TPN to goal rate (50>100 mL/hr) at 1800 Electrolytes in TPN: Na 522m/L, K 5055mL, Ca 5mE79m, Mg 5mEq28m and Phos 15mmo37m Cl:Ac 1:1 Additional repletion outside of TPN bag will be given for K of 3.2>3 & Phos of 2.3. Kphos 30mmol62mmeq K10mnd KCL IV 10mEq q150m doses. Add standard MVI and trace elements to TPN Add Thiamine '100mg'$  daily x 3 days (d2/3d) Initiate Sensitive q8h SSI and adjust as needed  Reduce MIVF to 75 mL/hr at 1800 Monitor TPN labs on Mon/Thurs  Brandon DLorna Dibble3,7:46 AM   07/28/21 @ 2240 remains low at K = 3.3 after 40 meq given. Will order another 20 meq IV today and repeat K level with AM labs.  Jarmal Lewelling Rodriguez-Guzman PharmD, BCPS 07/28/2021 10:41 PM

## 2021-07-29 ENCOUNTER — Inpatient Hospital Stay: Payer: BC Managed Care – PPO

## 2021-07-29 LAB — CBC
HCT: 39.6 % (ref 39.0–52.0)
Hemoglobin: 13.3 g/dL (ref 13.0–17.0)
MCH: 28.1 pg (ref 26.0–34.0)
MCHC: 33.6 g/dL (ref 30.0–36.0)
MCV: 83.7 fL (ref 80.0–100.0)
Platelets: 216 10*3/uL (ref 150–400)
RBC: 4.73 MIL/uL (ref 4.22–5.81)
RDW: 14.4 % (ref 11.5–15.5)
WBC: 6.7 10*3/uL (ref 4.0–10.5)
nRBC: 0 % (ref 0.0–0.2)

## 2021-07-29 LAB — GLUCOSE, CAPILLARY
Glucose-Capillary: 98 mg/dL (ref 70–99)
Glucose-Capillary: 111 mg/dL — ABNORMAL HIGH (ref 70–99)

## 2021-07-29 LAB — BASIC METABOLIC PANEL
Anion gap: 9 (ref 5–15)
BUN: 31 mg/dL — ABNORMAL HIGH (ref 6–20)
CO2: 27 mmol/L (ref 22–32)
Calcium: 9.4 mg/dL (ref 8.9–10.3)
Chloride: 107 mmol/L (ref 98–111)
Creatinine, Ser: 1.7 mg/dL — ABNORMAL HIGH (ref 0.61–1.24)
GFR, Estimated: 46 mL/min — ABNORMAL LOW (ref >60)
Glucose, Bld: 101 mg/dL — ABNORMAL HIGH (ref 70–99)
Potassium: 3.7 mmol/L (ref 3.5–5.1)
Sodium: 143 mmol/L (ref 135–145)

## 2021-07-29 LAB — MAGNESIUM: Magnesium: 2.5 mg/dL — ABNORMAL HIGH (ref 1.7–2.4)

## 2021-07-29 LAB — POTASSIUM: Potassium: 3.2 mmol/L — ABNORMAL LOW (ref 3.5–5.1)

## 2021-07-29 LAB — PHOSPHORUS: Phosphorus: 3 mg/dL (ref 2.5–4.6)

## 2021-07-29 MED ORDER — POTASSIUM CHLORIDE 10 MEQ/100ML IV SOLN
10.0000 meq | INTRAVENOUS | Status: AC
  Administered 2021-07-29 (×4): 10 meq via INTRAVENOUS
  Filled 2021-07-29 (×4): qty 100

## 2021-07-29 MED ORDER — TRAVASOL 10 % IV SOLN
INTRAVENOUS | Status: AC
  Filled 2021-07-29: qty 1296

## 2021-07-29 NOTE — Consult Note (Addendum)
PHARMACY - TOTAL PARENTERAL NUTRITION CONSULT NOTE   Indication: Prolonged ileus  Patient Measurements: Height: '5\' 10"'$  (177.8 cm) Weight: 104.8 kg (231 lb) IBW/kg (Calculated) : 73 TPN AdjBW (KG): 80.9 Body mass index is 33.15 kg/m. Usual Weight: 104.8kg  Assessment: 61 y.o. male with persistent abdominal distension and lack of bowel function, post-operative Ileus  Glucose / Insulin: BG 110-120s mg/dL (on SSI) Electrolytes: K 3>3.2>3.7 Phos 2.3>3; Mg 2.7>2.5(ULN) Renal: Scr 2.12>1.94>1.7 Hepatic: LFT's WNL Intake / Output; MIVF:  (Net +7.2L); OFF MIVF at goal rate TPN to supply daily intake requirements GI Imaging: DG Abd View: Findings compatible with small-bowel obstruction GI Surgeries / Procedures: s/p hand assisted left nephrectomy, appendectomy, and repair of coloappendiceal fistula.  Central access: 7/21 1105, PICC  TPN start date: 07/27/2021  Nutritional Goals: Goal TPN rate is 100 mL/hr (provides 129.6 g of protein and 2510 kcals per day)  RD Assessment: 2300-2600kcal/day,  115-130g/day protein,  2.3-2.6L/day fluid  Current Nutrition:  NPO  Plan:  Continue TPN at goal rate 100 mL/hr at 1800 Electrolytes in TPN: Na 1mq/L, K 538m/L, Ca 57m1mL, Mg 57mE70m, and Phos 157mm22m. Cl:Ac 1:1 K of 3>3.2>3.7 (MD ordered KCL IV 10meq22m x4 doses) Phos 2.3>3: WNL today. Mg 2.5 (ULN) Add standard MVI and trace elements to TPN Add Thiamine '100mg'$  daily x 3 days (d3/3d) Initiate Sensitive q8h SSI and adjust as needed  Monitor TPN labs on Mon/Thurs  BrandoLorna Dibble2023,11:18 AM

## 2021-07-29 NOTE — Progress Notes (Addendum)
End of shift note:  VS remained stable. Pt continues on IV TPN and he got x4 IV potassium replacement. Ng tube's output was 800 ml.  Pt had 3 BM today.

## 2021-07-29 NOTE — Progress Notes (Signed)
POD # 6 Still high output from NGT KUB persistent dilation AVSS Ambulating No pain  PE NAD Abd: soft, decrease bs, distended, no peritonitis and incisions c/d/I.  A/P  Doing well other than ileus Not ready for ng removal Kub in am ambulate

## 2021-07-30 ENCOUNTER — Inpatient Hospital Stay: Payer: BC Managed Care – PPO

## 2021-07-30 LAB — COMPREHENSIVE METABOLIC PANEL
ALT: 29 U/L (ref 0–44)
AST: 26 U/L (ref 15–41)
Albumin: 3.3 g/dL — ABNORMAL LOW (ref 3.5–5.0)
Alkaline Phosphatase: 38 U/L (ref 38–126)
Anion gap: 6 (ref 5–15)
BUN: 32 mg/dL — ABNORMAL HIGH (ref 6–20)
CO2: 26 mmol/L (ref 22–32)
Calcium: 8.9 mg/dL (ref 8.9–10.3)
Chloride: 107 mmol/L (ref 98–111)
Creatinine, Ser: 1.81 mg/dL — ABNORMAL HIGH (ref 0.61–1.24)
GFR, Estimated: 42 mL/min — ABNORMAL LOW (ref >60)
Glucose, Bld: 106 mg/dL — ABNORMAL HIGH (ref 70–99)
Potassium: 3.8 mmol/L (ref 3.5–5.1)
Sodium: 139 mmol/L (ref 135–145)
Total Bilirubin: 0.8 mg/dL (ref 0.3–1.2)
Total Protein: 7 g/dL (ref 6.5–8.1)

## 2021-07-30 LAB — CBC
HCT: 40.3 % (ref 39.0–52.0)
Hemoglobin: 13.4 g/dL (ref 13.0–17.0)
MCH: 28.2 pg (ref 26.0–34.0)
MCHC: 33.3 g/dL (ref 30.0–36.0)
MCV: 84.8 fL (ref 80.0–100.0)
Platelets: 225 10*3/uL (ref 150–400)
RBC: 4.75 MIL/uL (ref 4.22–5.81)
RDW: 14.4 % (ref 11.5–15.5)
WBC: 8.3 10*3/uL (ref 4.0–10.5)
nRBC: 0 % (ref 0.0–0.2)

## 2021-07-30 LAB — PHOSPHORUS: Phosphorus: 3 mg/dL (ref 2.5–4.6)

## 2021-07-30 LAB — GLUCOSE, CAPILLARY
Glucose-Capillary: 124 mg/dL — ABNORMAL HIGH (ref 70–99)
Glucose-Capillary: 109 mg/dL — ABNORMAL HIGH (ref 70–99)
Glucose-Capillary: 107 mg/dL — ABNORMAL HIGH (ref 70–99)

## 2021-07-30 LAB — TRIGLYCERIDES: Triglycerides: 96 mg/dL (ref <150)

## 2021-07-30 LAB — MAGNESIUM: Magnesium: 2.2 mg/dL (ref 1.7–2.4)

## 2021-07-30 MED ORDER — TRAVASOL 10 % IV SOLN
INTRAVENOUS | Status: AC
  Filled 2021-07-30: qty 1296

## 2021-07-30 NOTE — Progress Notes (Signed)
[facility] Hospital Day(s): 7.   Post op day(s): 7 Days Post-Op.   Interval History:  Patient seen and examined No acute events or new complaints overnight.  Patient reports he is feeling better, still distended, no pain No fever, chills, nausea, emesis Labs are reassuring; WBC normal; sCr stable, no electrolyte derangements  High output NGT; 1950 ccs He has had 3 BMs recorded; passing flatus  Vital signs in last 24 hours: [min-max] current  Temp:  [98.2 F (36.8 C)-98.9 F (37.2 C)] 98.2 F (36.8 C) (07/24 0755) Pulse Rate:  [82-90] 90 (07/24 0755) Resp:  [18] 18 (07/24 0755) BP: (140-156)/(85-96) 142/88 (07/24 0755) SpO2:  [97 %-100 %] 97 % (07/24 0755)     Height: '5\' 10"'$  (177.8 cm) Weight: 104.8 kg BMI (Calculated): 33.15   Intake/Output last 2 shifts:  07/23 0701 - 07/24 0700 In: 2311.5 [I.V.:2174.2; IV Piggyback:137.4] Out: 1950 [Emesis/NG output:1950]   Physical Exam:  Constitutional: alert, cooperative and no distress  HEENT: NGT in place Respiratory: breathing non-labored at rest  Cardiovascular: regular rate and sinus rhythm  Gastrointestinal: Soft, he does not appear tender this morning, he continues to be distended and tympanic, no rebound/guarding  Integumentary: Mini-laparotomy in LLQ and laparoscopic sites are CDI with dermabond, no erythema or drainage   Labs:     Latest Ref Rng & Units 07/30/2021    5:05 AM 07/29/2021    4:17 AM 07/28/2021    5:35 AM  CBC  WBC 4.0 - 10.5 K/uL 8.3  6.7  7.8   Hemoglobin 13.0 - 17.0 g/dL 13.4  13.3  13.7   Hematocrit 39.0 - 52.0 % 40.3  39.6  41.0   Platelets 150 - 400 K/uL 225  216  252       Latest Ref Rng & Units 07/30/2021    5:05 AM 07/29/2021   11:25 AM 07/29/2021    4:17 AM  CMP  Glucose 70 - 99 mg/dL 106  101    BUN 6 - 20 mg/dL 32  31    Creatinine 0.61 - 1.24 mg/dL 1.81  1.70    Sodium 135 - 145 mmol/L 139  143    Potassium 3.5 - 5.1 mmol/L 3.8  3.7  3.2    Chloride 98 - 111 mmol/L 107  107    CO2 22 - 32 mmol/L 26  27    Calcium 8.9 - 10.3 mg/dL 8.9  9.4    Total Protein 6.5 - 8.1 g/dL 7.0     Total Bilirubin 0.3 - 1.2 mg/dL 0.8     Alkaline Phos 38 - 126 U/L 38     AST 15 - 41 U/L 26     ALT 0 - 44 U/L 29        Imaging studies:  KUB (07/30/2021) personally reviewed still with dilated loops of small bowel with air fluid levels, air is scattered throughout the colon, no free air, and radiologist report reviewed below:  IMPRESSION: 1. Persistent small bowel dilation with air-fluid levels in the mid abdomen. Findings remain concerning for small bowel obstruction or marked ileus. Given persistence CT may be helpful depending on clinical status to assess for fluid collection or transition point that might explain above findings. 2. Basilar atelectasis with gastric tube in place.   Assessment/Plan: 61 y.o. male with persistent abdominal distension and lack of bowel function, post-operative Ileus 7 Days Post-Op s/p hand assisted left nephrectomy, appendectomy, and repair of coloappendiceal fistula.   -  Continue NPO + TPN; monitor electrolyte/nutritional labs; no issues   - Although having some degree of bowel function, his NGT output remains high. Would recommending continuation of NGT decompression today; LIS; monitor and record output          - Serial KUBs; order for AM          - Monitor renal function; sCr stable; good UO          - Monitor abdominal examination; on-going bowel function - Pain control prn; antiemetics prn  - Mobilization as tolerated; plans to ambulate   - Discharge Planning: Persistent ileus; not ready    All of the above findings and recommendations were discussed with the patient, patient's family (wife), and the medical team, and all of patient's and family's questions were answered to THeir expressed satisfaction.  -- Edison Simon, PA-C [facility] 07/30/2021, 9:14 AM M-F: 7am - 4pm

## 2021-07-30 NOTE — Consult Note (Addendum)
PHARMACY - TOTAL PARENTERAL NUTRITION CONSULT NOTE   Indication: Prolonged ileus  Patient Measurements: Height: '5\' 10"'$  (177.8 cm) Weight: 104.8 kg (231 lb) IBW/kg (Calculated) : 73 TPN AdjBW (KG): 80.9 Body mass index is 33.15 kg/m. Usual Weight: 104.8kg  Assessment: 61 y.o. male with persistent abdominal distension and lack of bowel function, post-operative Ileus  Glucose / Insulin: BG 100-120s mg/dL (on SSI) Electrolytes: K 3>3.2>3.7>3.8 (up trending on low end of normal) Phos 2.3>3>3 (stable); Mg 2.5 > 2.2 Renal: Scr 2.12>1.94>1.7>1.8  Hepatic: LFT's WNL Intake / Output; MIVF:  (Net +6.4L); OFF MIVF at goal rate TPN to supply daily intake requirements GI Imaging: DG Abd View: Findings compatible with small-bowel obstruction GI Surgeries / Procedures: s/p hand assisted left nephrectomy, appendectomy, and repair of coloappendiceal fistula.  Central access: 7/21 1105, PICC  TPN start date: 07/27/2021  Nutritional Goals: Goal TPN rate is 100 mL/hr (provides 129.6 g of protein and 2510 kcals per day)  RD Assessment: 2300-2600kcal/day,  115-130g/day protein,  2.3-2.6L/day fluid  Current Nutrition:  NPO  Plan:  Continue TPN at goal rate 100 mL/hr at 1800 Electrolytes in TPN: Na 37mq/L, K 50 mEq/L, Ca 562m/L, Mg 40m140mL, and Phos 140m12mL. Cl:Ac 1:1 Phos 2.3>3>3: WNL today. Mg 2.5 (ULN) Add standard MVI and trace elements to TPN Completed course of thiamine 7/23 Initiate Sensitive q8h SSI and adjust as needed  Monitor TPN labs on Mon/Thurs  CaroWynelle Cleveland4/2023,11:50 AM

## 2021-07-30 NOTE — Progress Notes (Signed)
Urology Inpatient Progress Note  Subjective: No acute events overnight.  He remains afebrile, VSS. Creatinine stable today, 1.81.  Hemoglobin stable, 13.4. NG tube remains in place for management of postoperative ileus per general surgery team. He reports feeling well today.  He continues to be ambulatory.  No acute concerns.  He is voiding without difficulty and denies gross hematuria.  Anti-infectives: Anti-infectives (From admission, onward)    Start     Dose/Rate Route Frequency Ordered Stop   07/23/21 1545  ceFAZolin (ANCEF) IVPB 1 g/50 mL premix        1 g 100 mL/hr over 30 Minutes Intravenous Every 8 hours 07/23/21 1454 07/23/21 2259   07/23/21 0632  ceFAZolin (ANCEF) 2-4 GM/100ML-% IVPB       Note to Pharmacy: Sylvester Harder P: cabinet override      07/23/21 0632 07/23/21 0853   07/23/21 0600  ceFAZolin (ANCEF) IVPB 2g/100 mL premix  Status:  Discontinued        2 g 200 mL/hr over 30 Minutes Intravenous On call to O.R. 07/23/21 0121 07/23/21 0122   07/23/21 0121  ceFAZolin (ANCEF) IVPB 2g/100 mL premix        2 g 200 mL/hr over 30 Minutes Intravenous 30 min pre-op 07/23/21 0121 07/23/21 1155       Current Facility-Administered Medications  Medication Dose Route Frequency Provider Last Rate Last Admin   acetaminophen (TYLENOL) tablet 650 mg  650 mg Oral Q4H PRN Hollice Espy, MD       Chlorhexidine Gluconate Cloth 2 % PADS 6 each  6 each Topical Daily Piscoya, Jose, MD   6 each at 07/29/21 0839   diphenhydrAMINE (BENADRYL) injection 12.5 mg  12.5 mg Intravenous Q6H PRN Hollice Espy, MD       Or   diphenhydrAMINE (BENADRYL) 12.5 MG/5ML elixir 12.5 mg  12.5 mg Oral Q6H PRN Hollice Espy, MD       docusate sodium (COLACE) capsule 100 mg  100 mg Oral BID Hollice Espy, MD   100 mg at 07/29/21 2145   heparin injection 5,000 Units  5,000 Units Subcutaneous Q8H Hollice Espy, MD   5,000 Units at 07/30/21 0454   irbesartan (AVAPRO) tablet 75 mg  75 mg Oral Daily Darrick Penna, RPH   75 mg at 07/29/21 5956   And   hydrochlorothiazide (HYDRODIURIL) tablet 12.5 mg  12.5 mg Oral Daily Darrick Penna, RPH   12.5 mg at 07/29/21 3875   insulin aspart (novoLOG) injection 0-9 Units  0-9 Units Subcutaneous Q8H Darrick Penna, [facility]   1 Units at 07/27/21 2140   loratadine (CLARITIN) tablet 10 mg  10 mg Oral Daily Hollice Espy, MD   10 mg at 07/29/21 0839   morphine (PF) 2 MG/ML injection 2-4 mg  2-4 mg Intravenous Q2H PRN Hollice Espy, MD   2 mg at 07/25/21 0030   ondansetron (ZOFRAN) injection 4 mg  4 mg Intravenous Q4H PRN Hollice Espy, MD   4 mg at 07/26/21 1023   oxybutynin (DITROPAN) tablet 5 mg  5 mg Oral Q8H PRN Hollice Espy, MD       oxybutynin (DITROPAN-XL) 24 hr tablet 10 mg  10 mg Oral Daily Hollice Espy, MD   10 mg at 07/29/21 0840   oxyCODONE-acetaminophen (PERCOCET/ROXICET) 5-325 MG per tablet 1-2 tablet  1-2 tablet Oral Q4H PRN Hollice Espy, MD   2 tablet at 07/24/21 1709   polyethylene glycol (MIRALAX / GLYCOLAX) packet 17 g  17 g Oral  Daily Piscoya, Jose, MD   17 g at 07/29/21 0842   sodium chloride flush (NS) 0.9 % injection 10-40 mL  10-40 mL Intracatheter Q12H Piscoya, Jose, MD   10 mL at 07/29/21 2146   sodium chloride flush (NS) 0.9 % injection 10-40 mL  10-40 mL Intracatheter PRN Olean Ree, MD       TPN ADULT (ION)   Intravenous Continuous TPN Lorna Dibble, RPH 100 mL/hr at 07/29/21 2356 Infusion Verify at 07/29/21 2356   Objective: Vital signs in last 24 hours: Temp:  [98.2 F (36.8 C)-98.9 F (37.2 C)] 98.2 F (36.8 C) (07/24 0755) Pulse Rate:  [82-90] 90 (07/24 0755) Resp:  [18] 18 (07/24 0755) BP: (140-156)/(85-96) 142/88 (07/24 0755) SpO2:  [97 %-100 %] 97 % (07/24 0755)  Intake/Output from previous day: 07/23 0701 - 07/24 0700 In: 2311.5 [I.V.:2174.2; IV Piggyback:137.4] Out: 1950 [Emesis/NG output:1950] Intake/Output this shift: No intake/output data recorded.  Physical Exam Vitals and nursing  note reviewed.  Constitutional:      General: He is not in acute distress.    Appearance: He is not ill-appearing, toxic-appearing or diaphoretic.  Pulmonary:     Effort: Pulmonary effort is normal. No respiratory distress.  Abdominal:     General: There is distension.     Tenderness: There is no abdominal tenderness. There is no guarding or rebound.     Comments: Distention improved compared to prior.  NG tube in place.  Skin:    General: Skin is warm and dry.  Neurological:     Mental Status: He is alert and oriented to person, place, and time.  Psychiatric:        Mood and Affect: Mood normal.        Behavior: Behavior normal.    Lab Results:  Recent Labs    07/29/21 0417 07/30/21 0505  WBC 6.7 8.3  HGB 13.3 13.4  HCT 39.6 40.3  PLT 216 225   BMET Recent Labs    07/29/21 1125 07/30/21 0505  NA 143 139  K 3.7 3.8  CL 107 107  CO2 27 26  GLUCOSE 101* 106*  BUN 31* 32*  CREATININE 1.70* 1.81*  CALCIUM 9.4 8.9   Assessment & Plan: 61 year old male POD 7 from combined laparoscopic left radical nephrectomy with Dr. Erlene Quan for management of a left renal mass and laparoscopic appendectomy with takedown of coloappendiceal fistula with Dr. Hampton Abbot for management of appendiceal mass with coloappendiceal fistula.  Last operative course has been complicated by ileus and NG tube remains in place today.  He continues to be ambulating well, renal function is slowly improving, and he is voiding without gross hematuria.  He is recovering well from his nephrectomy.  We agree with and continue to defer management of postoperative ileus to the general surgery team.  Debroah Loop, PA-C 07/30/2021

## 2021-07-31 ENCOUNTER — Inpatient Hospital Stay: Payer: BC Managed Care – PPO

## 2021-07-31 LAB — BASIC METABOLIC PANEL
Anion gap: 7 (ref 5–15)
BUN: 34 mg/dL — ABNORMAL HIGH (ref 6–20)
CO2: 25 mmol/L (ref 22–32)
Calcium: 9.6 mg/dL (ref 8.9–10.3)
Chloride: 107 mmol/L (ref 98–111)
Creatinine, Ser: 1.91 mg/dL — ABNORMAL HIGH (ref 0.61–1.24)
GFR, Estimated: 40 mL/min — ABNORMAL LOW (ref >60)
Glucose, Bld: 105 mg/dL — ABNORMAL HIGH (ref 70–99)
Potassium: 4.1 mmol/L (ref 3.5–5.1)
Sodium: 139 mmol/L (ref 135–145)

## 2021-07-31 LAB — GLUCOSE, CAPILLARY
Glucose-Capillary: 120 mg/dL — ABNORMAL HIGH (ref 70–99)
Glucose-Capillary: 88 mg/dL (ref 70–99)
Glucose-Capillary: 127 mg/dL — ABNORMAL HIGH (ref 70–99)

## 2021-07-31 LAB — CBC
HCT: 43.4 % (ref 39.0–52.0)
Hemoglobin: 14.4 g/dL (ref 13.0–17.0)
MCH: 28 pg (ref 26.0–34.0)
MCHC: 33.2 g/dL (ref 30.0–36.0)
MCV: 84.3 fL (ref 80.0–100.0)
Platelets: 265 10*3/uL (ref 150–400)
RBC: 5.15 MIL/uL (ref 4.22–5.81)
RDW: 14.6 % (ref 11.5–15.5)
WBC: 9.2 10*3/uL (ref 4.0–10.5)
nRBC: 0 % (ref 0.0–0.2)

## 2021-07-31 MED ORDER — TRAVASOL 10 % IV SOLN
INTRAVENOUS | Status: AC
  Filled 2021-07-31: qty 1296

## 2021-07-31 NOTE — Progress Notes (Signed)
Mobility Specialist - Progress Note    07/31/21 1435  Mobility  Activity Refused mobility  $Mobility charge 1 Mobility    Pt was upright in recliner on RA upon arrival. Pt did not have any pain or complaints. Pt had recently walked with nurse and did not want to walk again today. Pt was upright in recliner upon exit.   Gretchen Short  Mobility Specialist  07/31/21 2:36 PM

## 2021-07-31 NOTE — Consult Note (Signed)
PHARMACY - TOTAL PARENTERAL NUTRITION CONSULT NOTE   Indication: Prolonged ileus  Patient Measurements: Height: '5\' 10"'$  (177.8 cm) Weight: 103 kg (227 lb 1.2 oz) IBW/kg (Calculated) : 73 TPN AdjBW (KG): 80.9 Body mass index is 32.58 kg/m. Usual Weight: 104.8kg  Assessment: 61 y.o. male with persistent abdominal distension and lack of bowel function, post-operative Ileus  Glucose / Insulin: BG 106-120s mg/dL (on SSI) Electrolytes: K 3.8>4.1 (up trending on low end of normal) Phos 3 (stable); Mg 2.2 Renal: Scr 1.94>1.7>1.8>1.91 Hepatic: LFT's WNL Intake / Output; MIVF:  (Net +6.4L); OFF MIVF at goal rate TPN to supply daily intake requirements GI Imaging: DG Abd View: Findings compatible with small-bowel obstruction GI Surgeries / Procedures: s/p hand assisted left nephrectomy, appendectomy, and repair of coloappendiceal fistula.  Central access: 7/21 1105, PICC  TPN start date: 07/27/2021  Nutritional Goals: Goal TPN rate is 100 mL/hr (provides 129.6 g of protein and 2510 kcals per day)  RD Assessment: 2300-2600kcal/day,  115-130g/day protein,  2.3-2.6L/day fluid  Current Nutrition:  NPO  Plan:  Continue TPN at goal rate 100 mL/hr at 1800 Electrolytes in TPN: Na 31mq/L, K 50 mEq/L, Ca 560m/L, Mg 50m33mL, and Phos 150m10mL. Cl:Ac 1:1 Phos 2.3>3>3: WNL today. Mg 2.5 (ULN) Add standard MVI and trace elements to TPN Completed course of thiamine 7/23 Initiate Sensitive q8h SSI and adjust as needed  Monitor TPN labs on Mon/Thurs Plan is to attempt initiation of diet over next 24 hours. Follow up ability to tolerate full liquid diet and future rate goals.   CaroWynelle Cleveland5/2023,10:07 AM

## 2021-07-31 NOTE — Progress Notes (Signed)
[facility] Hospital Day(s): 8.   Post op day(s): 8 Days Post-Op.   Interval History:  Patient seen and examined No acute events or new complaints overnight.  Patient continues to report he is feeling better; distension improving No abdominal pain No fever, chills, nausea, emesis No new labs this morning High output NGT; recorded at 50 ccs but had an unrecorded 400 ccs overnight He has had 2 BMs recorded; passing flatus  Vital signs in last 24 hours: [min-max] current  Temp:  [97.8 F (36.6 C)-98.9 F (37.2 C)] 98.9 F (37.2 C) (07/25 0324) Pulse Rate:  [89-93] 93 (07/25 0324) Resp:  [16-18] 17 (07/25 0324) BP: (142-160)/(88-95) 143/90 (07/25 0324) SpO2:  [96 %-99 %] 96 % (07/25 0324) Weight:  [103 kg] 103 kg (07/25 0500)     Height: '5\' 10"'$  (177.8 cm) Weight: 103 kg BMI (Calculated): 32.58   Intake/Output last 2 shifts:  07/24 0701 - 07/25 0700 In: 2717.9 [I.V.:2717.9] Out: 850 [Urine:800; Emesis/NG output:50]   Physical Exam:  Constitutional: alert, cooperative and no distress  HEENT: NGT in place; now clamped Respiratory: breathing non-labored at rest  Cardiovascular: regular rate and sinus rhythm  Gastrointestinal: Soft, he does not appear tender this morning, he continues to be distended and tympanic, no rebound/guarding  Integumentary: Mini-laparotomy in LLQ and laparoscopic sites are CDI with dermabond, no erythema or drainage   Labs:     Latest Ref Rng & Units 07/30/2021    5:05 AM 07/29/2021    4:17 AM 07/28/2021    5:35 AM  CBC  WBC 4.0 - 10.5 K/uL 8.3  6.7  7.8   Hemoglobin 13.0 - 17.0 g/dL 13.4  13.3  13.7   Hematocrit 39.0 - 52.0 % 40.3  39.6  41.0   Platelets 150 - 400 K/uL 225  216  252       Latest Ref Rng & Units 07/30/2021    5:05 AM 07/29/2021   11:25 AM 07/29/2021    4:17 AM  CMP  Glucose 70 - 99 mg/dL 106  101    BUN 6 - 20 mg/dL 32  31    Creatinine 0.61 - 1.24 mg/dL 1.81  1.70    Sodium 135 - 145  mmol/L 139  143    Potassium 3.5 - 5.1 mmol/L 3.8  3.7  3.2   Chloride 98 - 111 mmol/L 107  107    CO2 22 - 32 mmol/L 26  27    Calcium 8.9 - 10.3 mg/dL 8.9  9.4    Total Protein 6.5 - 8.1 g/dL 7.0     Total Bilirubin 0.3 - 1.2 mg/dL 0.8     Alkaline Phos 38 - 126 U/L 38     AST 15 - 41 U/L 26     ALT 0 - 44 U/L 29        Imaging studies:  KUB (07/31/2021) personally reviewed which still shows dilated loops of bowel with air fluid levels although this seems to be slowly improving compared to previous days, there remains air in the colon, and radiologist report reviewed below:  IMPRESSION: 1. Persistent small bowel dilation with air-fluid levels in the mid abdomen. Findings remain concerning for small bowel obstruction or marked ileus. Given persistence CT may be helpful depending on clinical status to assess for fluid collection or transition point that might explain above findings. 2. Basilar atelectasis with gastric tube in place.   Assessment/Plan: 61 y.o. male with clinically improving  post-operative Ileus 8 Days Post-Op s/p hand assisted left nephrectomy, appendectomy, and repair of coloappendiceal fistula.   - Given clinical improvements with ROBF, we will proceed with NGT clamping trial. Clamped NGT at 0800. Check residuals at 1200. If residuals are less than 150 ccs, we can remove this and initiate diet    - Continue NPO for now + TPN; monitor electrolyte/nutritional labs; no issues           - Serial KUBs as needed          - Monitor renal function; sCr stable; good UO          - Monitor abdominal examination; on-going bowel function - Pain control prn; antiemetics prn  - Mobilization as tolerated; no issues reported with ambulation   - Discharge Planning: Ileus improving. Pending clamping trial today +/- initiation of diet   All of the above findings and recommendations were discussed with the patient, patient's family (wife), and the medical team, and all of patient's  and family's questions were answered to THeir expressed satisfaction.  -- Edison Simon, PA-C [facility] 07/31/2021, 7:30 AM M-F: 7am - 4pm

## 2021-07-31 NOTE — Progress Notes (Signed)
Mobility Specialist - Progress Note    07/31/21 1001  Mobility  Activity Ambulated independently in hallway;Dangled on edge of bed;Stood at bedside  Level of Assistance Independent  Assistive Device None  Distance Ambulated (ft) 160 ft  Activity Response Tolerated well  $Mobility charge 1 Mobility    Pt supine in chair on RA upon arrival. Pt complained of no pain. Pt STS and ambulates around NS indep. Pt left in chair with needs in reach.   Gretchen Short  Mobility Specialist  07/31/2021

## 2021-07-31 NOTE — Progress Notes (Signed)
143m out from NG after clamp trial. ZOtho KetPA notified. Order received to remove NG and start patient on a clear liquid diet. Patient encouraged to go easy on liquids

## 2021-08-01 ENCOUNTER — Other Ambulatory Visit: Payer: Self-pay | Admitting: Anatomic Pathology & Clinical Pathology

## 2021-08-01 LAB — GLUCOSE, CAPILLARY
Glucose-Capillary: 114 mg/dL — ABNORMAL HIGH (ref 70–99)
Glucose-Capillary: 94 mg/dL (ref 70–99)
Glucose-Capillary: 113 mg/dL — ABNORMAL HIGH (ref 70–99)

## 2021-08-01 LAB — CBC
HCT: 42.7 % (ref 39.0–52.0)
Hemoglobin: 14.2 g/dL (ref 13.0–17.0)
MCH: 28.1 pg (ref 26.0–34.0)
MCHC: 33.3 g/dL (ref 30.0–36.0)
MCV: 84.6 fL (ref 80.0–100.0)
Platelets: 277 10*3/uL (ref 150–400)
RBC: 5.05 MIL/uL (ref 4.22–5.81)
RDW: 14.6 % (ref 11.5–15.5)
WBC: 7.6 10*3/uL (ref 4.0–10.5)
nRBC: 0 % (ref 0.0–0.2)

## 2021-08-01 LAB — BASIC METABOLIC PANEL
Anion gap: 6 (ref 5–15)
BUN: 35 mg/dL — ABNORMAL HIGH (ref 6–20)
CO2: 25 mmol/L (ref 22–32)
Calcium: 9.3 mg/dL (ref 8.9–10.3)
Chloride: 106 mmol/L (ref 98–111)
Creatinine, Ser: 1.9 mg/dL — ABNORMAL HIGH (ref 0.61–1.24)
GFR, Estimated: 40 mL/min — ABNORMAL LOW (ref >60)
Glucose, Bld: 107 mg/dL — ABNORMAL HIGH (ref 70–99)
Potassium: 4.4 mmol/L (ref 3.5–5.1)
Sodium: 137 mmol/L (ref 135–145)

## 2021-08-01 LAB — SURGICAL PATHOLOGY

## 2021-08-01 MED ORDER — ADULT MULTIVITAMIN W/MINERALS CH
1.0000 | ORAL_TABLET | Freq: Every day | ORAL | Status: DC
  Administered 2021-08-02: 1 via ORAL
  Filled 2021-08-01: qty 1

## 2021-08-01 MED ORDER — ENSURE ENLIVE PO LIQD
237.0000 mL | Freq: Three times a day (TID) | ORAL | Status: DC
  Administered 2021-08-01 – 2021-08-02 (×2): 237 mL via ORAL

## 2021-08-01 MED ORDER — TRAVASOL 10 % IV SOLN
INTRAVENOUS | Status: DC
  Filled 2021-08-01: qty 648

## 2021-08-01 NOTE — Progress Notes (Signed)
[facility] Hospital Day(s): 9.   Post op day(s): 9 Days Post-Op.   Interval History:  Patient seen and examined No acute events or new complaints overnight.  Patient he continues to do well; distension continues improving No abdominal pain No fever, chills, nausea, emesis CBC is grossly unremarkable Renal function stable with sCr - 1.90; UO unmeasured x7 NGT removed yesterday (07/25) after passing clamping trial He has had 5 BMs recorded; passing flatus Now on CLD; tolerating well   Vital signs in last 24 hours: [min-max] current  Temp:  [98.2 F (36.8 C)-98.8 F (37.1 C)] 98.5 F (36.9 C) (07/26 0346) Pulse Rate:  [88-105] 90 (07/26 0346) Resp:  [16-18] 16 (07/26 0346) BP: (126-145)/(81-87) 126/87 (07/26 0346) SpO2:  [97 %-100 %] 97 % (07/26 0346) Weight:  [106.4 kg] 106.4 kg (07/26 0359)     Height: '5\' 10"'$  (177.8 cm) Weight: 106.4 kg BMI (Calculated): 33.66   Intake/Output last 2 shifts:  07/25 0701 - 07/26 0700 In: 2701.2 [P.O.:240; I.V.:2461.2] Out: 500 [Emesis/NG output:500]   Physical Exam:  Constitutional: alert, cooperative and no distress  Respiratory: breathing non-labored at rest  Cardiovascular: regular rate and sinus rhythm  Gastrointestinal: Soft, he does not appear tender this morning, distension and tympany improving, no rebound/guarding  Integumentary: Mini-laparotomy in LLQ and laparoscopic sites are CDI with dermabond, no erythema or drainage   Labs:     Latest Ref Rng & Units 08/01/2021    6:39 AM 07/31/2021    9:05 AM 07/30/2021    5:05 AM  CBC  WBC 4.0 - 10.5 K/uL 7.6  9.2  8.3   Hemoglobin 13.0 - 17.0 g/dL 14.2  14.4  13.4   Hematocrit 39.0 - 52.0 % 42.7  43.4  40.3   Platelets 150 - 400 K/uL 277  265  225       Latest Ref Rng & Units 08/01/2021    6:39 AM 07/31/2021    9:05 AM 07/30/2021    5:05 AM  CMP  Glucose 70 - 99 mg/dL 107  105  106   BUN 6 - 20 mg/dL 35  34  32   Creatinine 0.61 - 1.24  mg/dL 1.90  1.91  1.81   Sodium 135 - 145 mmol/L 137  139  139   Potassium 3.5 - 5.1 mmol/L 4.4  4.1  3.8   Chloride 98 - 111 mmol/L 106  107  107   CO2 22 - 32 mmol/L '25  25  26   '$ Calcium 8.9 - 10.3 mg/dL 9.3  9.6  8.9   Total Protein 6.5 - 8.1 g/dL   7.0   Total Bilirubin 0.3 - 1.2 mg/dL   0.8   Alkaline Phos 38 - 126 U/L   38   AST 15 - 41 U/L   26   ALT 0 - 44 U/L   29      Imaging studies: No new pertinent imaging studies   Assessment/Plan: 61 y.o. male with clinically improving post-operative Ileus 9 Days Post-Op s/p hand assisted left nephrectomy, appendectomy, and repair of coloappendiceal fistula.   - Will advance to full liquid diet this morning; May be able to do soft diet for dinner vs tomorrow morning.   - Continue TPN at goal rate. If he tolerates full liquids, we can do 1/2 rate starting this PM. Will not discontinue until certain he can tolerate soft diet.          - Monitor  renal function; sCr stable; good UO          - Monitor abdominal examination; on-going bowel function - Pain control prn; antiemetics prn  - Mobilization as tolerated; no issues reported with ambulation   - Discharge Planning: Ileus resolving; diet advancing. Anticipate ready for DC in next 24-48 hours.   All of the above findings and recommendations were discussed with the patient, patient's family (wife), and the medical team, and all of patient's and family's questions were answered to THeir expressed satisfaction.  -- Edison Simon, PA-C [facility] 08/01/2021, 7:15 AM M-F: 7am - 4pm

## 2021-08-01 NOTE — Consult Note (Addendum)
PHARMACY - TOTAL PARENTERAL NUTRITION CONSULT NOTE   Indication: Prolonged ileus  Patient Measurements: Height: '5\' 10"'$  (177.8 cm) Weight: 106.4 kg (234 lb 9.1 oz) IBW/kg (Calculated) : 73 TPN AdjBW (KG): 80.9 Body mass index is 33.66 kg/m. Usual Weight: 104.8kg  Assessment: 61 y.o. male with persistent abdominal distension and lack of bowel function, post-operative Ileus  Glucose / Insulin: BG 88-120 mg/dL (on SSI) Electrolytes: K 3.8>4.1 (up trending on low end of normal) Phos 3 (stable); Mg 2.2 Lytes WNL Completed course of thiamine 7/23 Renal: Scr 1.94>1.7>1.8>1.91>1.90 Hepatic: LFT's WNL Intake / Output; MIVF:  (Net +9.2L); OFF MIVF at goal rate TPN to supply daily intake requirements GI Imaging: DG Abd View: Findings compatible with small-bowel obstruction GI Surgeries / Procedures: s/p hand assisted left nephrectomy, appendectomy, and repair of coloappendiceal fistula.  Central access: 7/21 1105, PICC  TPN start date: 07/27/2021  Nutritional Goals: Goal TPN rate is 100 mL/hr (provides 129.6 g of protein and 2510 kcals per day)  RD Assessment: 2300-2600kcal/day,  115-130g/day protein,  2.3-2.6L/day fluid  Current Nutrition:  Full liquids  Plan:  Reduce TPN to 1/2 of goal rate- 50 mL/hr.  Diet has advanced to thin liquids with possible advancement to soft diet 7/26 PM Electrolytes in TPN: Na 53mq/L, K 50 mEq/L, Ca 524m/L, Mg 67m67mL, and Phos 167m22mL. Cl:Ac 1:1 Add standard MVI and trace elements to TPN Continue Sensitive q8h SSI and adjust as needed  Monitor TPN labs on Mon/Thurs  CaroWynelle Cleveland6/2023,10:05 AM

## 2021-08-01 NOTE — Progress Notes (Signed)
Nutrition Follow Up Note   DOCUMENTATION CODES:   Obesity unspecified  INTERVENTION:   TPN per pharmacy- plan is for 1/2 rate tonight  Ensure Enlive po TID, each supplement provides 350 kcal and 20 grams of protein.  MVI po daily   NUTRITION DIAGNOSIS:   Inadequate oral intake related to acute illness as evidenced by NPO status.  GOAL:   Patient will meet greater than or equal to 90% of their needs -met with TPN   MONITOR:   PO intake, Supplement acceptance, Diet advancement, Labs, Weight trends, Skin, I & O's, TPN  ASSESSMENT:   61 y/o male with h/o prostate cancer, HTN, hiatal hernia, coloappendiceal fistula and renal mass now s/p hand assisted left nephrectomy, appendectomy and repair of coloappendiceal fistula 6/74 complicated by post op healing.  Pt tolerating TPN well at goal rate; plan is for 1/2 rate tonight. Met with pt in room today. Pt reports that he is feeling much better today. Pt reports his abdomen is less distended and his pain is improved. Pt is having bowel function. NGT removed yesterday and pt initiated on a clear liquid diet. Pt advanced to full liquids today. Pt reports eating some broth, jello and a italian ice for breakfast today. Pt had some grits for lunch. RD reiterated to pt the importance of adequate nutrition needed for post op healing. Pt is willing to drink strawberry Ensure in hospital. RD will add supplements and MVI to help pt meet his estimated needs. Per chart, pt appears fairly weight stable since admission. Pt with questions today regarding post-op nutrition. RD answered all of pt's questions and provided education on a low residue diet and a low sodium diet.   Medications reviewed and include: colace, heparin, insulin, hydrochlorothiazide, miralax  Labs reviewed: K 4.4 wnl, BUN 35(H), creat 1.90(H) P 3.0 wnl, Mg 2.2 wnl- 7/24 Cbgs- 94, 114 x 24 hrs  Diet Order:   Diet Order             Diet full liquid Room service appropriate? Yes;  Fluid consistency: Thin  Diet effective now                  EDUCATION NEEDS:   Education needs have been addressed  Skin:  Skin Assessment: Reviewed RN Assessment (incision abdomen)  Last BM:  7/25- TYPE 6  Height:   Ht Readings from Last 1 Encounters:  07/23/21 _0  (1.778 m)    Weight:   Wt Readings from Last 1 Encounters:  08/01/21 106.4 kg    Ideal Body Weight:  75.45 kg  BMI:  Body mass index is 33.66 kg/m.  Estimated Nutritional Needs:   Kcal:  2300-2600kcal/day  Protein:  115-130g/day  Fluid:  2.3-2.6L/day  Koleen Distance MS, RD, LDN Please refer to [facility] for RD and/or RD on-call/weekend/after hours pager

## 2021-08-02 LAB — CBC
HCT: 43.8 % (ref 39.0–52.0)
Hemoglobin: 14.6 g/dL (ref 13.0–17.0)
MCH: 28.4 pg (ref 26.0–34.0)
MCHC: 33.3 g/dL (ref 30.0–36.0)
MCV: 85.2 fL (ref 80.0–100.0)
Platelets: 296 10*3/uL (ref 150–400)
RBC: 5.14 MIL/uL (ref 4.22–5.81)
RDW: 14.4 % (ref 11.5–15.5)
WBC: 7.4 10*3/uL (ref 4.0–10.5)
nRBC: 0 % (ref 0.0–0.2)

## 2021-08-02 LAB — COMPREHENSIVE METABOLIC PANEL
ALT: 76 U/L — ABNORMAL HIGH (ref 0–44)
AST: 44 U/L — ABNORMAL HIGH (ref 15–41)
Albumin: 3.6 g/dL (ref 3.5–5.0)
Alkaline Phosphatase: 59 U/L (ref 38–126)
Anion gap: 6 (ref 5–15)
BUN: 39 mg/dL — ABNORMAL HIGH (ref 6–20)
CO2: 25 mmol/L (ref 22–32)
Calcium: 9.3 mg/dL (ref 8.9–10.3)
Chloride: 105 mmol/L (ref 98–111)
Creatinine, Ser: 1.97 mg/dL — ABNORMAL HIGH (ref 0.61–1.24)
GFR, Estimated: 38 mL/min — ABNORMAL LOW (ref >60)
Glucose, Bld: 105 mg/dL — ABNORMAL HIGH (ref 70–99)
Potassium: 4.6 mmol/L (ref 3.5–5.1)
Sodium: 136 mmol/L (ref 135–145)
Total Bilirubin: 0.8 mg/dL (ref 0.3–1.2)
Total Protein: 7.7 g/dL (ref 6.5–8.1)

## 2021-08-02 LAB — GLUCOSE, CAPILLARY: Glucose-Capillary: 110 mg/dL — ABNORMAL HIGH (ref 70–99)

## 2021-08-02 LAB — PHOSPHORUS: Phosphorus: 3.7 mg/dL (ref 2.5–4.6)

## 2021-08-02 LAB — MAGNESIUM: Magnesium: 2.3 mg/dL (ref 1.7–2.4)

## 2021-08-02 LAB — TRIGLYCERIDES: Triglycerides: 86 mg/dL (ref <150)

## 2021-08-02 MED ORDER — ACETAMINOPHEN 500 MG PO TABS: 1000.0000 mg | ORAL_TABLET | Freq: Four times a day (QID) | ORAL | Status: DC | PRN

## 2021-08-02 MED ORDER — OXYCODONE HCL 5 MG PO TABS: 5.0000 mg | ORAL_TABLET | Freq: Four times a day (QID) | ORAL | 0 refills | Status: DC | PRN

## 2021-08-02 NOTE — Progress Notes (Signed)
Urology Inpatient Progress Note  Subjective: No acute events overnight.  He is afebrile, VSS. Creatinine remains stable today, 1.97.  Hemoglobin remained stable, 14.6. 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 He has been passing flatus and had several bowel movements.  Plans for discharge today per general surgery.  He remains ambulatory and has no acute concerns today.  Anti-infectives: Anti-infectives (From admission, onward)    Start     Dose/Rate Route Frequency Ordered Stop   07/23/21 1545  ceFAZolin (ANCEF) IVPB 1 g/50 mL premix        1 g 100 mL/hr over 30 Minutes Intravenous Every 8 hours 07/23/21 1454 07/23/21 2259   07/23/21 0632  ceFAZolin (ANCEF) 2-4 GM/100ML-% IVPB       Note to Pharmacy: Sylvester Harder P: cabinet override      07/23/21 0632 07/23/21 0853   07/23/21 0600  ceFAZolin (ANCEF) IVPB 2g/100 mL premix  Status:  Discontinued        2 g 200 mL/hr over 30 Minutes Intravenous On call to O.R. 07/23/21 0121 07/23/21 0122   07/23/21 0121  ceFAZolin (ANCEF) IVPB 2g/100 mL premix        2 g 200 mL/hr over 30 Minutes Intravenous 30 min pre-op 07/23/21 0121 07/23/21 1155       Current Facility-Administered Medications  Medication Dose Route Frequency Provider Last Rate Last Admin   acetaminophen (TYLENOL) tablet 650 mg  650 mg Oral Q4H PRN Hollice Espy, MD       Chlorhexidine Gluconate Cloth 2 % PADS 6 each  6 each Topical Daily Piscoya, Jose, MD   6 each at 08/02/21 0816   diphenhydrAMINE (BENADRYL) injection 12.5 mg  12.5 mg Intravenous Q6H PRN Hollice Espy, MD       Or   diphenhydrAMINE (BENADRYL) 12.5 MG/5ML elixir 12.5 mg  12.5 mg Oral Q6H PRN Hollice Espy, MD       docusate sodium (COLACE) capsule 100 mg  100 mg Oral BID Hollice Espy, MD   100 mg at 08/02/21 0817   feeding supplement (ENSURE ENLIVE / ENSURE PLUS) liquid 237 mL  237 mL Oral TID BM Piscoya, Jose, MD   237 mL at 08/02/21 0817   heparin injection 5,000 Units   5,000 Units Subcutaneous Q8H Hollice Espy, MD   5,000 Units at 08/02/21 0512   irbesartan (AVAPRO) tablet 75 mg  75 mg Oral Daily Darrick Penna, RPH   75 mg at 08/02/21 3299   And   hydrochlorothiazide (HYDRODIURIL) tablet 12.5 mg  12.5 mg Oral Daily Darrick Penna, RPH   12.5 mg at 08/02/21 2426   insulin aspart (novoLOG) injection 0-9 Units  0-9 Units Subcutaneous Q8H Darrick Penna, [facility]   1 Units at 07/31/21 2104   loratadine (CLARITIN) tablet 10 mg  10 mg Oral Daily Hollice Espy, MD   10 mg at 08/02/21 0817   morphine (PF) 2 MG/ML injection 2-4 mg  2-4 mg Intravenous Q2H PRN Hollice Espy, MD   2 mg at 07/25/21 0030   multivitamin with minerals tablet 1 tablet  1 tablet Oral Daily Tylene Fantasia, PA-C   1 tablet at 08/02/21 0817   ondansetron (ZOFRAN) injection 4 mg  4 mg Intravenous Q4H PRN Hollice Espy, MD   4 mg at 07/26/21 1023   oxybutynin (DITROPAN) tablet 5 mg  5 mg Oral Q8H PRN Hollice Espy, MD       oxybutynin (DITROPAN-XL) 24 hr tablet 10 mg  10 mg  Oral Daily Hollice Espy, MD   10 mg at 08/02/21 0817   oxyCODONE-acetaminophen (PERCOCET/ROXICET) 5-325 MG per tablet 1-2 tablet  1-2 tablet Oral Q4H PRN Hollice Espy, MD   2 tablet at 07/24/21 1709   polyethylene glycol (MIRALAX / GLYCOLAX) packet 17 g  17 g Oral Daily Piscoya, Jose, MD   17 g at 08/02/21 0817   sodium chloride flush (NS) 0.9 % injection 10-40 mL  10-40 mL Intracatheter Q12H Piscoya, Jose, MD   10 mL at 08/02/21 0818   sodium chloride flush (NS) 0.9 % injection 10-40 mL  10-40 mL Intracatheter PRN Olean Ree, MD       TPN ADULT (ION)   Intravenous Continuous TPN Wynelle Cleveland, RPH 50 mL/hr at 08/02/21 0311 Infusion Verify at 08/02/21 0311   Objective: Vital signs in last 24 hours: Temp:  [98.4 F (36.9 C)-99 F (37.2 C)] 98.4 F (36.9 C) (07/27 0502) Pulse Rate:  [86-111] 111 (07/27 0746) Resp:  [17-18] 18 (07/27 0502) BP: (107-130)/(82-93) 128/93 (07/27 0746) SpO2:  [98  %-100 %] 100 % (07/27 0746) Weight:  [105.1 kg] 105.1 kg (07/27 0500)  Intake/Output from previous day: 07/26 0701 - 07/27 0700 In: 2199.1 [P.O.:600; I.V.:1599.1] Out: -  Intake/Output this shift: No intake/output data recorded.  Physical Exam Vitals and nursing note reviewed.  Constitutional:      General: He is not in acute distress.    Appearance: He is not ill-appearing, toxic-appearing or diaphoretic.  HENT:     Head: Normocephalic and atraumatic.  Pulmonary:     Effort: Pulmonary effort is normal. No respiratory distress.  Genitourinary:    Comments: Significant improvement in abdominal distention compared to prior. Skin:    General: Skin is warm and dry.  Neurological:     Mental Status: He is alert and oriented to person, place, and time.  Psychiatric:        Mood and Affect: Mood normal.        Behavior: Behavior normal.    Lab Results:  Recent Labs    08/01/21 0639 08/02/21 0504  WBC 7.6 7.4  HGB 14.2 14.6  HCT 42.7 43.8  PLT 277 296   BMET Recent Labs    08/01/21 0639 08/02/21 0504  NA 137 136  K 4.4 4.6  CL 106 105  CO2 25 25  GLUCOSE 107* 105*  BUN 35* 39*  CREATININE 1.90* 1.97*  CALCIUM 9.3 9.3   Assessment & Plan: 61 year old male POD10 from combined laparoscopic left radical nephrectomy with Dr. Erlene Quan for management of a left renal mass and laparoscopic appendectomy with takedown of coloappendiceal fistula with Dr. Hampton Abbot for management of appendiceal mass with coloappendiceal fistula.  Postoperative course has been complicated by ileus, resolved.  We discussed his surgical pathology.  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  I will confer with Dr. Erlene Quan regarding the need for follow-up imaging and we will reach out to the patient with these recommendations.  Debroah Loop, PA-C 08/02/2021

## 2021-08-02 NOTE — Progress Notes (Signed)
Patient discharged to home with family. AVS ad medications reviewed with patient. Patient expressed no complaints or concerns at this time. PICC line removed by IV team per order.

## 2021-08-02 NOTE — Plan of Care (Signed)

## 2021-08-02 NOTE — Plan of Care (Signed)
  Problem: Education: Goal: Knowledge of General Education information will improve Description: Including pain rating scale, medication(s)/side effects and non-pharmacologic comfort measures 08/02/2021 1252 by Evelena Peat, RN Outcome: Completed/Met 08/02/2021 0957 by Evelena Peat, RN Outcome: Progressing   Problem: Health Behavior/Discharge Planning: Goal: Ability to manage health-related needs will improve 08/02/2021 1252 by Evelena Peat, RN Outcome: Completed/Met 08/02/2021 0957 by Evelena Peat, RN Outcome: Progressing   Problem: Clinical Measurements: Goal: Ability to maintain clinical measurements within normal limits will improve 08/02/2021 1252 by Evelena Peat, RN Outcome: Completed/Met 08/02/2021 0957 by Evelena Peat, RN Outcome: Progressing Goal: Will remain free from infection 08/02/2021 1252 by Evelena Peat, RN Outcome: Completed/Met 08/02/2021 0957 by Evelena Peat, RN Outcome: Progressing Goal: Diagnostic test results will improve 08/02/2021 1252 by Evelena Peat, RN Outcome: Completed/Met 08/02/2021 0957 by Evelena Peat, RN Outcome: Progressing Goal: Respiratory complications will improve 08/02/2021 1252 by Evelena Peat, RN Outcome: Completed/Met 08/02/2021 0957 by Evelena Peat, RN Outcome: Progressing Goal: Cardiovascular complication will be avoided 08/02/2021 1252 by Evelena Peat, RN Outcome: Completed/Met 08/02/2021 0957 by Evelena Peat, RN Outcome: Progressing

## 2021-08-02 NOTE — Discharge Summary (Signed)
Patient ID: Patrick Pittman MRN: [id number] DOB/AGE: Jun 29, 1960 61 y.o.  Admit date: 07/23/2021 Discharge date: 08/02/2021   Discharge Diagnoses:  Principal Problem:   Left renal mass Active Problems:   Mass of appendix   Fistula of appendix   Procedures:  Laparoscopic hand-assisted radical left nephrectomy Laparoscopic hand-assisted appendectomy with takedown of colo-appendiceal fistula.  Hospital Course: Patient was admitted on 07/23/21 for the above surgeries.  Post-operatively he initially did well and his creatinine elevated as expected and stabilized.  Foley catheter was removed on POD#1.  He unfortunately developed post-operative ileus and required NG tube placement and initiation of TPN.  He eventually regained bowel function and his diet was slowly advanced and the TPN discontinued.  His pain was well controlled, he was ambulating, having good urine output, with stable creatinine, and was deemed ready for discharge home.  He will follow up with me and with Dr. Erlene Quan.  On exam today, he was in no acute distress with stable vital signs.  His abdomen is soft, non-distended, non-tender.  Incisions are clean, dry, intact.  Pathology results came back on the day of discharge.  I have discussed with him the appendix portion of the results.  His case will be discussed on 08/09/21 during our multidisciplinary tumor board meeting.  Disposition:  Home, self-care  Discharge Instructions     Call MD for:  difficulty breathing, headache or visual disturbances   Complete by: As directed    Call MD for:  persistant nausea and vomiting   Complete by: As directed    Call MD for:  redness, tenderness, or signs of infection (pain, swelling, redness, odor or green/yellow discharge around incision site)   Complete by: As directed    Call MD for:  severe uncontrolled pain   Complete by: As directed    Call MD for:  temperature >100.4   Complete by: As directed    Discharge diet:   Complete by:  As directed    Keep a soft diet for the next 2 days to allow your intestines to adapt better and function better.  Then can start advancing to your normal foods, taking in consideration the recommendations from Urology.   Discharge instructions   Complete by: As directed    1.  Patient may shower, but do not scrub wounds heavily and dab dry only. 2.  Do not submerge wounds in pool/tub until fully healed. 3.  Do not apply ointments or hydrogen peroxide to the wounds. 4.  May apply ice packs to the wounds for comfort.   Driving Restrictions   Complete by: As directed    Do not drive while taking narcotics for pain control.  Prior to driving, make sure you are able to rotate right and left to look at blindspots without significant pain or discomfort.   Increase activity slowly   Complete by: As directed    Lifting restrictions   Complete by: As directed    No heavy lifting or pushing of more than 10-15 lbs for 4 weeks.   No dressing needed   Complete by: As directed       Allergies as of 08/02/2021   No Known Allergies      Medication List     TAKE these medications    acetaminophen 500 MG tablet Commonly known as: TYLENOL Take 2 tablets (1,000 mg total) by mouth every 6 (six) hours as needed for mild pain.   loratadine 10 MG tablet Commonly known as: CLARITIN Take 10  mg by mouth daily.   oxyCODONE 5 MG immediate release tablet Commonly known as: Oxy IR/ROXICODONE Take 1 tablet (5 mg total) by mouth every 6 (six) hours as needed for severe pain.   sildenafil 20 MG tablet Commonly known as: REVATIO Take 20 mg by mouth. Take 2-5 tablets by mouth as needed   valsartan-hydrochlorothiazide 80-12.5 MG tablet Commonly known as: DIOVAN-HCT Take 1 tablet by mouth daily.               Discharge Care Instructions  (From admission, onward)           Start     Ordered   08/02/21 0000  No dressing needed        08/02/21 8250            Follow-up Information      Olean Ree, MD Follow up in 2 week(s).   Specialty: General Surgery Contact information: [address] [place] [address] [phone]         Hollice Espy, MD Follow up on 08/28/2021.   Specialty: Urology Why: Patient has appointment with Dr. Erlene Quan on 08/28/21. Contact information: [place] [phone]

## 2021-08-09 ENCOUNTER — Other Ambulatory Visit: Payer: BC Managed Care – PPO

## 2021-08-09 NOTE — Progress Notes (Signed)
Tumor Board Documentation  Patrick Pittman was presented by Dr Erlene Quan and Piscoya at our Tumor Board on 08/09/2021, which included representatives from medical oncology, surgical, pulmonology, radiology, pathology, navigation, radiation oncology, genetics, internal medicine, pharmacy.  Patrick Pittman currently presents as an external consult, for [place], for new positive pathology with history of the following treatments: surgical intervention(s).  Additionally, we reviewed previous medical and familial history, history of present illness, and recent lab results along with all available histopathologic and imaging studies. The tumor board considered available treatment options and made the following recommendations: Surgery (Right Colectomy possible Wedge resction of Sigmoid Colon)    The following procedures/referrals were also placed: No orders of the defined types were placed in this encounter.   Clinical Trial Status: not discussed   Staging used: Pathologic Stage AJCC Staging: T: p 3     Group: NET Appendix Grade I   National site-specific guidelines   were discussed with respect to the case.  Tumor board is a meeting of clinicians from various specialty areas who evaluate and discuss patients for whom a multidisciplinary approach is being considered. Final determinations in the plan of care are those of the provider(s). The responsibility for follow up of recommendations given during tumor board is that of the provider.   Today's extended care, comprehensive team conference, Patrick Pittman was not present for the discussion and was not examined.   Multidisciplinary Tumor Board is a multidisciplinary case peer review process.  Decisions discussed in the Multidisciplinary Tumor Board reflect the opinions of the specialists present at the conference without having examined the patient.  Ultimately, treatment and diagnostic decisions rest with the primary provider(s) and the patient.

## 2021-08-13 ENCOUNTER — Other Ambulatory Visit: Payer: BC Managed Care – PPO

## 2021-08-15 ENCOUNTER — Encounter: Payer: Self-pay | Admitting: Surgery

## 2021-08-15 ENCOUNTER — Ambulatory Visit: Payer: BC Managed Care – PPO | Admitting: Urology

## 2021-08-15 ENCOUNTER — Ambulatory Visit (INDEPENDENT_AMBULATORY_CARE_PROVIDER_SITE_OTHER): Payer: BC Managed Care – PPO | Admitting: Surgery

## 2021-08-15 ENCOUNTER — Telehealth: Payer: Self-pay

## 2021-08-15 VITALS — BP 121/81 | HR 85 | Temp 98.4°F | Wt 214.6 lb

## 2021-08-15 DIAGNOSIS — C7A8 Other malignant neuroendocrine tumors: Secondary | ICD-10-CM

## 2021-08-15 NOTE — Progress Notes (Signed)
08/15/2021  HPI: Patrick Pittman is a 61 y.o. male s/p laparoscopic hand-assisted left nephrectomy and appendectomy with takedown of colo-appendiceal fistula with Dr. Erlene Quan and myself on 07/23/2021.  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  The distal staple line was used to transect across an appendiceal/sigmoid fistula.  The patient's postoperative course was complicated with a postoperative ileus which required NG tube placement and TPN for nutrition.  Today, the patient reports that he has been doing well and has been ambulating and recovering his energy and strength.  He has been eating and denies any nausea or vomiting and is having regular bowel function.  Vital signs: BP 121/81   Pulse 85   Temp 98.4 F (36.9 C) (Oral)   Wt 214 lb 9.6 oz (97.3 kg)   SpO2 97%   BMI 30.79 kg/m    Physical Exam: Constitutional: No acute distress Abdomen: Soft, nondistended, with no significant tenderness to palpation.  Incisions are healing well and are clean, dry, intact.  Assessment/Plan: This is a 61 y.o. male s/p laparoscopic hand-assisted left nephrectomy and appendectomy with takedown of colo-appendiceal fistula.  - Discussed with patient again results from the pathology of the appendix showing a neuroendocrine tumor.  After discussion of his case and multidisciplinary tumor board conference, it was noted that the appendiceal mass extended all the way to the radial margin making it at least a T3 stage.  Also tumor cells extended to the staple line at the colon appendiceal fistula portion.  Given the size of the mass, and also the extent radially and distally, discussed with the patient today that the overall recommendation will be to proceed with right colectomy to help with the staging  given the size of the mass as well as sigmoidectomy given the extension of tumor cells into the staple line at the sigmoid colon.  Discussed with him that unfortunately is not feasible to obtain real-time determination of positive margins if we were to try and remove only a sliver of sigmoid colon and given that we already tried to remove a wedge of it, we may be ending narrowing the colon too much.  As such, it would be better to proceed with the sigmoidectomy.  This would then involve 2 different anastomosis between terminal ileum and transverse colon as well as descending colon to rectum.  His sigmoid colon is quite redundant on his CT scan, so may be able to remove a small portion of the sigmoid colon instead.  However in light of the 2 different anastomosis, it may be that we also have to do a diverting loop ileostomy to allow for the 2 anastomosis to heal rather than risking to potential sites of complications.  Discussed with him that eligible depending on how both areas look during surgery particularly how healthy and how inflamed they may be.  The patient understands this and is in agreement. - Patient will follow-up with me in about 6 weeks for H&P update.  Discussed with him that given the significant amount of scarring that he had near the appendix, I would want to wait until early October in order to proceed with his surgery.  This would be open surgery.  Also discussed the need for right ureteral stent in order to identify the ureter better particularly given that  he now only has right kidney. - Patient will be scheduled tentatively for 10/09/2021.  Will see him towards the end of September for H&P update in the meantime we will send for medical clearance.  [name], [facility]

## 2021-08-15 NOTE — Telephone Encounter (Signed)
Medical clearance faxed to Dr.Adrian mancheno at this time.

## 2021-08-15 NOTE — Patient Instructions (Addendum)
Our surgery scheduler Pamala Hurry will call you within 24-48 hours to get you scheduled. If you have not heard from her after 48 hours, please call our office. Have the blue sheet available when she calls to write down important information.   If you have any concerns or questions, please feel free to call our office. See follow up appointment below.    GENERAL POST-OPERATIVE PATIENT INSTRUCTIONS   WOUND CARE INSTRUCTIONS:  Keep a dry clean dressing on the wound if there is drainage. The initial bandage may be removed after 24 hours.  Once the wound has quit draining you may leave it open to air.  If clothing rubs against the wound or causes irritation and the wound is not draining you may cover it with a dry dressing during the daytime.  Try to keep the wound dry and avoid ointments on the wound unless directed to do so.  If the wound becomes bright red and painful or starts to drain infected material that is not clear, please contact your physician immediately.  If the wound is mildly pink and has a thick firm ridge underneath it, this is normal, and is referred to as a healing ridge.  This will resolve over the next 4-6 weeks.  BATHING: You may shower if you have been informed of this by your surgeon. However, Please do not submerge in a tub, hot tub, or pool until incisions are completely sealed or have been told by your surgeon that you may do so.  DIET:  You may eat any foods that you can tolerate.  It is a good idea to eat a high fiber diet and take in plenty of fluids to prevent constipation.  If you do become constipated you may want to take a mild laxative or take ducolax tablets on a daily basis until your bowel habits are regular.  Constipation can be very uncomfortable, along with straining, after recent surgery.  ACTIVITY:  You are encouraged to cough and deep breath or use your incentive spirometer if you were given one, every 15-30 minutes when awake.  This will help prevent respiratory  complications and low grade fevers post-operatively if you had a general anesthetic.  You may want to hug a pillow when coughing and sneezing to add additional support to the surgical area, if you had abdominal or chest surgery, which will decrease pain during these times.  You are encouraged to walk and engage in light activity for the next two weeks.  You should not lift more than 20 pounds for 6 weeks total after surgery as it could put you at increased risk for complications.  Twenty pounds is roughly equivalent to a plastic bag of groceries. At that time- Listen to your body when lifting, if you have pain when lifting, stop and then try again in a few days. Soreness after doing exercises or activities of daily living is normal as you get back in to your normal routine.  MEDICATIONS:  Try to take narcotic medications and anti-inflammatory medications, such as tylenol, ibuprofen, naprosyn, etc., with food.  This will minimize stomach upset from the medication.  Should you develop nausea and vomiting from the pain medication, or develop a rash, please discontinue the medication and contact your physician.  You should not drive, make important decisions, or operate machinery when taking narcotic pain medication.  SUNBLOCK Use sun block to incision area over the next year if this area will be exposed to sun. This helps decrease scarring and will  allow you avoid a permanent darkened area over your incision.  QUESTIONS:  Please feel free to call our office if you have any questions, and we will be glad to assist you. [phone]

## 2021-08-16 ENCOUNTER — Other Ambulatory Visit: Payer: Self-pay

## 2021-08-16 DIAGNOSIS — K388 Other specified diseases of appendix: Secondary | ICD-10-CM

## 2021-08-18 DIAGNOSIS — C7A8 Other malignant neuroendocrine tumors: Secondary | ICD-10-CM | POA: Insufficient documentation

## 2021-08-18 DIAGNOSIS — C649 Malignant neoplasm of unspecified kidney, except renal pelvis: Secondary | ICD-10-CM | POA: Insufficient documentation

## 2021-08-18 NOTE — Progress Notes (Unsigned)
[place]  Telephone:(336) [phone] Fax:(336) [phone]  ID: Patrick Pittman OB: 06-Mar-1960  MR#: [id number]  KCL#:[id number]  Patient Care Team: Theotis Burrow, MD as PCP - General (Family Medicine) Clent Jacks, RN as Oncology Nurse Navigator  CHIEF COMPLAINT: Well-differentiated neuroendocrine tumor of appendix-grade 1, prostate cancer, Gleason's 3+4, Renal oncocytic neoplasm, left.  INTERVAL HISTORY: Patient is a 61 year old male with a history of prostate cancer who recently underwent surgical resection of a kidney mass as well as an appendiceal mass.  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  Patient currently feels well and is asymptomatic.  He has no neurologic complaints.  He denies any recent fevers or illnesses.  He has a good appetite and denies weight loss he has no chest pain, shortness of breath, cough, or hemoptysis.  He denies any nausea, vomiting, constipation, or diarrhea.  He has no abdominal pain.  He has no urinary complaints.  Patient offers no specific complaints today.  REVIEW OF SYSTEMS:   Review of Systems  Constitutional: Negative.  Negative for fever, malaise/fatigue and weight loss.  Respiratory: Negative.  Negative for cough, hemoptysis and shortness of breath.   Cardiovascular: Negative.  Negative for chest pain and leg swelling.  Gastrointestinal: Negative.  Negative for abdominal pain, blood in stool, constipation, diarrhea, melena, nausea and vomiting.  Genitourinary: Negative.  Negative for dysuria, frequency and hematuria.  Musculoskeletal: Negative.  Negative for back pain.  Skin: Negative.  Negative for rash.  Neurological: Negative.  Negative for dizziness, focal weakness, weakness and headaches.  Psychiatric/Behavioral: Negative.  The patient is not nervous/anxious.     As per HPI. Otherwise, a complete review of  systems is negative.  PAST MEDICAL HISTORY: Past Medical History:  Diagnosis Date   Cancer ([place])    History of hiatal hernia    Hypertension    No blood products    Jehovah's witness    PAST SURGICAL HISTORY: Past Surgical History:  Procedure Laterality Date   COLONOSCOPY     COLONOSCOPY WITH PROPOFOL N/A 05/23/2021   Procedure: COLONOSCOPY WITH PROPOFOL;  Surgeon: [name], MD;  Location: [facility] ENDOSCOPY;  Service: Gastroenterology;  Laterality: N/A;   LAPAROSCOPIC APPENDECTOMY N/A 07/23/2021   Procedure: APPENDECTOMY LAPAROSCOPIC HAND ASSISTED;  Surgeon: Olean Ree, MD;  Location: [facility] ORS;  Service: General;  Laterality: N/A;   LAPAROSCOPIC NEPHRECTOMY, HAND ASSISTED Left 07/23/2021   Procedure: HAND ASSISTED LAPAROSCOPIC RADICAL NEPHRECTOMY;  Surgeon: Hollice Espy, MD;  Location: [facility] ORS;  Service: Urology;  Laterality: Left;   RESECTION DISTAL CLAVICAL Right 07/26/2015   Procedure: Open excision of right distal clavicle.;  Surgeon: Corky Mull, MD;  Location: [place];  Service: Orthopedics;  Laterality: Right;   TAKE DOWN OF INTESTINAL FISTULA  07/23/2021   Procedure: TAKE DOWN OF INTESTINAL FISTULA;  Surgeon: Olean Ree, MD;  Location: [facility] ORS;  Service: General;;    FAMILY HISTORY: Family History  Problem Relation Age of Onset   Breast cancer Mother    Prostate cancer Father    Prostate cancer Brother    Prostate cancer Brother 86       metastatic   Prostate cancer Brother    Prostate cancer Brother    Prostate cancer Nephew 78    ADVANCED DIRECTIVES (Y/N):  N  HEALTH MAINTENANCE: Social History   Tobacco Use   Smoking status: Never    Passive exposure: Never  Smokeless tobacco: Never  Vaping Use   Vaping Use: Never used  Substance Use Topics   Alcohol use: Not Currently   Drug use: Never     Colonoscopy:  PAP:  Bone density:  Lipid panel:  No Known Allergies  Current Outpatient Medications  Medication Sig Dispense Refill    acetaminophen (TYLENOL) 500 MG tablet Take 2 tablets (1,000 mg total) by mouth every 6 (six) hours as needed for mild pain.     loratadine (CLARITIN) 10 MG tablet Take 10 mg by mouth daily.     sildenafil (REVATIO) 20 MG tablet Take 20 mg by mouth. Take 2-5 tablets by mouth as needed     valsartan-hydrochlorothiazide (DIOVAN-HCT) 80-12.5 MG tablet Take 1 tablet by mouth daily.     No current facility-administered medications for this visit.    OBJECTIVE: Vitals:   08/21/21 1130  BP: 122/89  Pulse: 98  Resp: 18  Temp: (!) 97 F (36.1 C)  SpO2: 98%     Body mass index is 30.71 kg/m.    ECOG FS:0 - Asymptomatic  General: Well-developed, well-nourished, no acute distress. Eyes: Pink conjunctiva, anicteric sclera. HEENT: Normocephalic, moist mucous membranes. Lungs: No audible wheezing or coughing. Heart: Regular rate and rhythm. Abdomen: Soft, nontender, no obvious distention. Musculoskeletal: No edema, cyanosis, or clubbing. Neuro: Alert, answering all questions appropriately. Cranial nerves grossly intact. Skin: No rashes or petechiae noted. Psych: Normal affect. Lymphatics: No cervical, calvicular, axillary or inguinal LAD.   LAB RESULTS:  Lab Results  Component Value Date   NA 136 08/02/2021   K 4.6 08/02/2021   CL 105 08/02/2021   CO2 25 08/02/2021   GLUCOSE 105 (H) 08/02/2021   BUN 39 (H) 08/02/2021   CREATININE 1.97 (H) 08/02/2021   CALCIUM 9.3 08/02/2021   PROT 7.7 08/02/2021   ALBUMIN 3.6 08/02/2021   AST 44 (H) 08/02/2021   ALT 76 (H) 08/02/2021   ALKPHOS 59 08/02/2021   BILITOT 0.8 08/02/2021   GFRNONAA 38 (L) 08/02/2021    Lab Results  Component Value Date   WBC 7.4 08/02/2021   HGB 14.6 08/02/2021   HCT 43.8 08/02/2021   MCV 85.2 08/02/2021   PLT 296 08/02/2021     STUDIES: DG ABD ACUTE 2+V W 1V CHEST  Result Date: 07/31/2021 CLINICAL DATA:  Small-bowel obstruction. Left renal mass. Patient had left kidney and appendix removed 9 days ago.  EXAM: DG ABDOMEN ACUTE WITH 1 VIEW CHEST COMPARISON:  KUB with PA chest 07/30/2021 CT abdomen and pelvis 04/17/2021 FINDINGS: Enteric tube again descends below the diaphragm and curls along the fundus of the stomach with the side port overlying the cardiac stomach and theo tip terminating overlying the mid stomach. Right upper extremity PICC tip overlies the mid to central superior vena cava. Cardiac silhouette and mediastinal contours are within normal limits minimally decreased lung volumes. Minimal bibasilar atelectasis. No pleural effusion or pneumothorax. No subdiaphragmatic free air. There is again dilatation of small-bowel loops with air-fluid levels within the left upper quadrant greater than the right lower quadrant. Individual small-bowel loops again measure up to approximately 6 cm. There is again no significant colonic air visualized. There is a small amount of air seen within the rectum, similar to prior. IMPRESSION: 1. No significant change from prior. Persistent small bowel dilatation with air-fluid levels again suggesting small bowel obstruction or high-grade ileus. Again there is no significant more distal colonic air is seen. 2. Nasogastric tube in appropriate position. Electronically Signed   By:  Yvonne Kendall M.D.   On: 07/31/2021 08:35   DG ABD ACUTE 2+V W 1V CHEST  Result Date: 07/30/2021 CLINICAL DATA:  A 61 year old male presents for follow-up of small-bowel obstruction. EXAM: DG ABDOMEN ACUTE WITH 1 VIEW CHEST COMPARISON:  April 17, 2021 and radiographs from July 29, 2021 FINDINGS: Gastric tube courses into the abdomen, tip in the mid to distal stomach, side port below GE junction. Trachea midline. Cardiomediastinal contours and hilar structures are normal. RIGHT-sided PICC line in stable position. Tip in the distal superior vena cava. Lungs are clear aside from basilar atelectasis. No free air beneath either the RIGHT or LEFT hemidiaphragm. Persistent small bowel dilation with air-fluid  levels in the mid abdomen not substantially changed compared to the radiograph July 23rd. Individual small-bowel loops up to 6 cm. No substantial colonic gas with scattered small amounts of gas in the colon and small amount of gas in the rectum which is diminished. On limited assessment no acute regional skeletal process. IMPRESSION: 1. Persistent small bowel dilation with air-fluid levels in the mid abdomen. Findings remain concerning for small bowel obstruction or marked ileus. Given persistence CT may be helpful depending on clinical status to assess for fluid collection or transition point that might explain above findings. 2. Basilar atelectasis with gastric tube in place. Electronically Signed   By: Zetta Bills M.D.   On: 07/30/2021 08:42   DG ABD ACUTE 2+V W 1V CHEST  Result Date: 07/29/2021 CLINICAL DATA:  Ileus postoperative EXAM: DG ABDOMEN ACUTE WITH 1 VIEW CHEST COMPARISON:  07/28/2021 FINDINGS: No acute abnormality of the lungs. Heart and mediastinum are normal. Esophagogastric tube with tip and side port below the diaphragm. Similar appearance of gas and fluid distended loops of small bowel in the central abdomen, measuring up to 6.7 cm in caliber. Colonic gas is present to the rectum. No free air in the abdomen. IMPRESSION: 1. Similar appearance of gas and fluid distended loops of small bowel in the central abdomen, measuring up to 6.7 cm in caliber, most consistent with ileus. Colonic gas is present to the rectum. 2. No acute abnormality of the lungs. Electronically Signed   By: Delanna Ahmadi M.D.   On: 07/29/2021 12:52   DG ABD ACUTE 2+V W 1V CHEST  Result Date: 07/28/2021 CLINICAL DATA:  Ileus following GI surgery.  Symptoms improvement. EXAM: DG ABDOMEN ACUTE WITH 1 VIEW CHEST COMPARISON:  07/27/2021 FINDINGS: Right-sided PICC line has tip over the SVC. Enteric tube courses into the stomach with tip over the right upper quadrant likely over the distal stomach or proximal duodenum. Lungs  are somewhat hypoinflated with mild left base opacification and blunting of the costophrenic angle likely atelectasis with possible small amount of pleural fluid. Cardiomediastinal silhouette and remainder of the chest is unchanged. Stable chronic changes over the right shoulder. Abdominopelvic images demonstrate minimal air within the rectosigmoid colon. There are persistent dilated air-filled small bowel loops present measuring up to 5.6 cm in diameter. These findings are not significantly changed. Multiple scattered air-fluid levels unchanged. No evidence of free peritoneal air. Remainder of the exam is unchanged. IMPRESSION: 1. Persistent dilated air-filled small bowel loops with scattered air-fluid levels compatible with persistent postoperative ileus and less likely obstruction. 2. Minimal left base opacification likely atelectasis with possible small amount of pleural fluid. 3. Tubes and lines as described. Electronically Signed   By: Marin Olp M.D.   On: 07/28/2021 08:07   [place] EKG SITE RITE  Result Date: 07/27/2021 If  Site Rite image not attached, placement could not be confirmed due to current cardiac rhythm.  DG ABD ACUTE 2+V W 1V CHEST  Result Date: 07/27/2021 CLINICAL DATA:  Follow-up ileus EXAM: DG ABDOMEN ACUTE WITH 1 VIEW CHEST COMPARISON:  Chest x-ray dated June 26, 2021 FINDINGS: NG tube tip and side port project over the stomach. Cardiac and mediastinal contours are within normal limits. Mild bibasilar atelectasis. Lungs otherwise clear. No large pleural effusion or evidence of pneumothorax. NG tube tip and side port project over the stomach. Multiple dilated loops of small bowel, similar to prior exam. Multiple air-fluid levels on upright imaging. No evidence of free air. IMPRESSION: 1. NG tube tip and side port project over the stomach. 2. Findings compatible with small-bowel obstruction. Electronically Signed   By: Yetta Glassman M.D.   On: 07/27/2021 08:47   DG Abd 1  View  Result Date: 07/26/2021 CLINICAL DATA:  NG tube placement EXAM: ABDOMEN - 1 VIEW COMPARISON:  None Available. FINDINGS: Nasogastric tube coiled in the stomach. No bowel dilatation to suggest obstruction. No evidence of pneumoperitoneum, portal venous gas or pneumatosis. No pathologic calcifications along the expected course of the ureters. No acute osseous abnormality. IMPRESSION: 1. Nasogastric tube coiled in the stomach. Electronically Signed   By: Kathreen Devoid M.D.   On: 07/26/2021 13:30   DG ABD ACUTE 2+V W 1V CHEST  Result Date: 07/26/2021 CLINICAL DATA:  Abdominal pain and distention. EXAM: DG ABDOMEN ACUTE WITH 1 VIEW CHEST COMPARISON:  Previous studies including CT done on 04/17/2021 and PET-CT done on 07/12/2021 FINDINGS: There is marked distention of small-bowel loops measuring up to 6.2 cm. There are air-fluid levels in the dilated small bowel loops. There is no pneumoperitoneum. Colon is not definitely visualized. Stomach is not significantly dilated. There are linear densities in both lower lung fields suggesting subsegmental atelectasis. IMPRESSION: There is abnormal dilation of small bowel loops with air-fluid levels suggesting high-grade small bowel obstruction. There is no pneumoperitoneum. Linear densities are seen in both lower lung fields suggesting subsegmental atelectasis. Electronically Signed   By: Elmer Picker M.D.   On: 07/26/2021 10:33    ASSESSMENT: Well-differentiated neuroendocrine tumor of appendix-grade 1, prostate cancer, Gleason's 3+4, Renal oncocytic neoplasm, left.  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  Patient underwent surgical resection on July 23, 2021 and noted to have a fistula connecting to the sigmoid colon.  Patient also had positive margins at the staple line.  Plan is to do a reresection on October 09, 2021 in addition to a small  resection of patient's sigmoid colon.  It is unlikely he will require adjuvant chemotherapy, but will await final pathology.  Patient has been scheduled to follow-up in mid October to discuss his final pathology and any additional treatment planning necessary. Renal oncocytic neoplasm: Resected with clear margins.  No further intervention is needed. Prostate cancer: Gleason's 3+4.  Given patient's intermediate risk, he will require treatment with either surgery plus ADT therapy or XRT plus ADT therapy.  Patient has an appointment with urology next week to make a final decision.   Patient expressed understanding and was in agreement with this plan. He also understands that He can call clinic at any time with any questions, concerns, or complaints.    Cancer Staging  Neuroendocrine carcinoma of appendix [facility]) Staging form: Appendix - Neuroendocrine Tumors, AJCC 8th Edition - Clinical stage from 08/21/2021: Stage II (  cT3, cN0, cM0) - Signed by Lloyd Huger, MD on 08/21/2021 Histologic grade (G): G1 Histologic grading system: 3 grade system   Lloyd Huger, MD   08/21/2021 1:13 PM

## 2021-08-21 ENCOUNTER — Telehealth: Payer: Self-pay | Admitting: Surgery

## 2021-08-21 ENCOUNTER — Encounter: Payer: Self-pay | Admitting: Oncology

## 2021-08-21 ENCOUNTER — Inpatient Hospital Stay: Payer: BC Managed Care – PPO | Attending: Oncology | Admitting: Oncology

## 2021-08-21 ENCOUNTER — Inpatient Hospital Stay: Payer: BC Managed Care – PPO

## 2021-08-21 VITALS — BP 122/89 | HR 98 | Temp 97.0°F | Resp 18 | Ht 70.0 in | Wt 214.0 lb

## 2021-08-21 DIAGNOSIS — Z803 Family history of malignant neoplasm of breast: Secondary | ICD-10-CM | POA: Diagnosis not present

## 2021-08-21 DIAGNOSIS — C61 Malignant neoplasm of prostate: Secondary | ICD-10-CM | POA: Insufficient documentation

## 2021-08-21 DIAGNOSIS — C7A8 Other malignant neuroendocrine tumors: Secondary | ICD-10-CM

## 2021-08-21 DIAGNOSIS — Z8042 Family history of malignant neoplasm of prostate: Secondary | ICD-10-CM | POA: Diagnosis not present

## 2021-08-21 DIAGNOSIS — C7A020 Malignant carcinoid tumor of the appendix: Secondary | ICD-10-CM | POA: Diagnosis present

## 2021-08-21 DIAGNOSIS — I10 Essential (primary) hypertension: Secondary | ICD-10-CM | POA: Diagnosis not present

## 2021-08-21 DIAGNOSIS — C642 Malignant neoplasm of left kidney, except renal pelvis: Secondary | ICD-10-CM

## 2021-08-21 NOTE — Telephone Encounter (Signed)
Outgoing call, left message for patient to call.  Please inform him of the following regarding scheduled surgery with Dr. Hampton Abbot on 10/09/21.   Pre-Admission date/time, and Surgery date.  Surgery Date: 10/09/21 @ [place] Preadmission Testing Date: 09/27/21 (Patient to arrive @ 9:00 am in person, go to preadmissions)  Also patient will need to call @ [phone], between 1-3:00pm the day before surgery, to find out what time to arrive for surgery.

## 2021-08-21 NOTE — Telephone Encounter (Signed)
Patient calls back and he is informed of all dates regarding his scheduled surgery and verbalized understanding.

## 2021-08-21 NOTE — Progress Notes (Signed)
Met with Patrick Pittman alongside Dr. Grayland Ormond. Provided information for nurse navigator. Plan for return visit following 10/09/21 surgery. Appointment provided.

## 2021-08-22 ENCOUNTER — Ambulatory Visit: Payer: BC Managed Care – PPO | Admitting: Urology

## 2021-08-24 ENCOUNTER — Telehealth: Payer: Self-pay | Admitting: Urology

## 2021-08-24 ENCOUNTER — Other Ambulatory Visit: Payer: BC Managed Care – PPO

## 2021-08-24 DIAGNOSIS — C61 Malignant neoplasm of prostate: Secondary | ICD-10-CM

## 2021-08-24 NOTE — Telephone Encounter (Signed)
Pt would like for Dr Erlene Quan to put in a referral for nutrition services, so he can see a [place], due to only having 1 kidney.

## 2021-08-25 LAB — PSA: Prostate Specific Ag, Serum: 12 ng/mL — ABNORMAL HIGH (ref 0.0–4.0)

## 2021-08-28 ENCOUNTER — Ambulatory Visit (INDEPENDENT_AMBULATORY_CARE_PROVIDER_SITE_OTHER): Payer: BC Managed Care – PPO | Admitting: Urology

## 2021-08-28 ENCOUNTER — Encounter: Payer: Self-pay | Admitting: Urology

## 2021-08-28 VITALS — BP 146/81 | HR 81 | Ht 70.0 in | Wt 214.0 lb

## 2021-08-28 DIAGNOSIS — C61 Malignant neoplasm of prostate: Secondary | ICD-10-CM

## 2021-08-28 DIAGNOSIS — N183 Chronic kidney disease, stage 3 unspecified: Secondary | ICD-10-CM | POA: Diagnosis not present

## 2021-08-28 DIAGNOSIS — C7A1 Malignant poorly differentiated neuroendocrine tumors: Secondary | ICD-10-CM | POA: Diagnosis not present

## 2021-08-28 DIAGNOSIS — D3002 Benign neoplasm of left kidney: Secondary | ICD-10-CM | POA: Diagnosis not present

## 2021-08-28 DIAGNOSIS — N2889 Other specified disorders of kidney and ureter: Secondary | ICD-10-CM

## 2021-08-28 DIAGNOSIS — Q600 Renal agenesis, unilateral: Secondary | ICD-10-CM

## 2021-08-28 NOTE — Progress Notes (Signed)
08/28/21 9:31 AM   Patrick Pittman [birth year] [id number]  Referring provider:  Theotis Burrow, MD [address] [place],  [place] [address] Chief Complaint  Patient presents with   Post-op Problem      HPI: Patrick Pittman is a 61 y.o.male  with a personal history of prostate cancer, oviod structure and abdominal fistula, who presents today for a 1 month post-op follow-up.  He had a prostate bx on 02/24/19. His path report indicates 5 of 12 cores involved low volume Gleason 3+3 up to 10% of tissue bilaterally primarily at apex and lateral bases.  TRUS 117 g/    Prostate MRI on 03/01/2021 visualized Anterior tumor in the LEFT paramidline mid to apical transitional zone bulging the capsule, suspicious for extracapsular extension of this PIRADS category 5 lesion. No signs of adenopathy or bone lesion in the pelvis. Degree of restricted diffusion is concerning for high-risk disease.    Fusion biopsy on 04/04/2021 was consistent with pathology of Gleason 3+4 involving 1 core affecting 40% at left apex lateral, Gleason 3+3 involving 2 cores at the left apex and right apex lateral affecting up to 20%   He underwent a GPS on 04/25/2021 that revealed GPS result of 15 and that his likelihood of distant metastasis within 10 years is 3% and likelihood of death due to prostate cancer within 10 years is <1 % if treated with radical prostatectomy or radiation therapy.    He has a strong family history of prostate cancer, 5 brothers in the family all now with prostate cancer.  He mentions for first brothers had prostatectomy and one of them had radiation.  He underwent a CT abdomen an pelvis on 04/17/2021 to further evaluate ovoid structure/ abdominal fistula  that visualized 5.4 by 4.6 by 4.7 cm solid enhancing mass of the left mid kidney extending partially into the renal hilum. No tumor thrombus in the adjacent renal vein tributaries. 2.0 by 1.9 cm fluid density lesion of the right  mid kidney most compatible with a benign Bosniak category 1 cyst.    NM bone scan on 05/03/2021 visualized abnormal tracer uptake at the superolateral RIGHT orbit and at the anterior LEFT iliac bone suspicious for osseous metastases. Additional nonspecific findings as above. No abnormal tracer uptake is seen at multiple small additional sclerotic osseous foci identified on recent CT  He is status post hand assisted laparoscopic left radical nephrectomy on 07/23/2021. 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  He notes that he did well after being discharged.  His postoperative course was prolonged, 11-day hospital admission for ileus.  Ultimately, upon recovering, he is doing well.  He is asking about a return date which he is hoping will be early next month.  No incision issues.  He is scheduled to undergo a  stent placement during colon resection with Dr Hampton Abbot.   PMH: Past Medical History:  Diagnosis Date   Cancer ([place])    History of hiatal hernia    Hypertension    No blood products    Jehovah's witness    Surgical History: Past Surgical History:  Procedure Laterality Date   COLONOSCOPY     COLONOSCOPY WITH PROPOFOL N/A 05/23/2021   Procedure: COLONOSCOPY WITH PROPOFOL;  Surgeon: [name], MD;  Location[facility] ENDOSCOPY;  Service: Gastroenterology;  Laterality: N/A;   LAPAROSCOPIC APPENDECTOMY N/A 07/23/2021  Procedure: APPENDECTOMY LAPAROSCOPIC HAND ASSISTED;  Surgeon: Olean Ree, MD;  Location: [facility] ORS;  Service: General;  Laterality: N/A;   LAPAROSCOPIC NEPHRECTOMY, HAND ASSISTED Left 07/23/2021   Procedure: HAND ASSISTED LAPAROSCOPIC RADICAL NEPHRECTOMY;  Surgeon: Hollice Espy, MD;  Location: [facility] ORS;  Service: Urology;  Laterality: Left;   RESECTION DISTAL CLAVICAL Right 07/26/2015   Procedure: Open excision of right  distal clavicle.;  Surgeon: Corky Mull, MD;  Location: [place];  Service: Orthopedics;  Laterality: Right;   TAKE DOWN OF INTESTINAL FISTULA  07/23/2021   Procedure: TAKE DOWN OF INTESTINAL FISTULA;  Surgeon: Olean Ree, MD;  Location: [facility] ORS;  Service: General;;    Home Medications:  Allergies as of 08/28/2021   No Known Allergies      Medication List        Accurate as of August 28, 2021  9:31 AM. If you have any questions, ask your nurse or doctor.          STOP taking these medications    acetaminophen 500 MG tablet Commonly known as: TYLENOL Stopped by: Hollice Espy, MD       TAKE these medications    loratadine 10 MG tablet Commonly known as: CLARITIN Take 10 mg by mouth daily.   sildenafil 20 MG tablet Commonly known as: REVATIO Take 20 mg by mouth. Take 2-5 tablets by mouth as needed   valsartan-hydrochlorothiazide 80-12.5 MG tablet Commonly known as: DIOVAN-HCT Take 1 tablet by mouth daily.        Allergies:  No Known Allergies  Family History: Family History  Problem Relation Age of Onset   Breast cancer Mother    Prostate cancer Father    Prostate cancer Brother    Prostate cancer Brother 60       metastatic   Prostate cancer Brother    Prostate cancer Brother    Prostate cancer Nephew 24    Social History:  reports that he has never smoked. He has never been exposed to tobacco smoke. He has never used smokeless tobacco. He reports that he does not currently use alcohol. He reports that he does not use drugs.   Physical Exam: BP (!) 146/81   Pulse 81   Ht '5\' 10"'$  (1.778 m)   Wt 214 lb (97.1 kg)   BMI 30.71 kg/m   Constitutional:  Alert and oriented, No acute distress. HEENT: [place] AT, moist mucus membranes.  Trachea midline, no masses. Cardiovascular: No clubbing, cyanosis, or edema. Respiratory: Normal respiratory effort, no increased work of breathing. GI: No hernia ans incisions healing well Skin: No rashes,  bruises or suspicious lesions. Neurologic: Grossly intact, no focal deficits, moving all 4 extremities. Psychiatric: Normal mood and affect.  Laboratory Data: Lab Results  Component Value Date   CREATININE 1.97 (H) 08/02/2021   Lab Results  Component Value Date   HGBA1C 5.2 07/27/2021    Assessment & Plan:   Left oncocytic neoplasm  - S/p hand-assisted laparoscopic left radical nephrectomy  - Discussed with tumor board. Benign pathology  - Incisions are healing well -No further evaluation is needed  2. Neuroendocrine carcinoma of appendix  - Will undergo protective stent placement with me during his colon resection with Dr Hampton Abbot. We discussed this in detail and the rationale to undergo this primarily for the purpose of ureteral identification.  We discussed risk and benefits.   3. Prostate cancer  -PSA continues to rise and PSMA PET scan revealed organ confined.  - We discussed radiation  with ADT versus prostatectomy.  We discussed full concerns including multiple back-to-back abdominal surgeries and risk of adhesions, ileus, greater than 100 g prostate as well as concern for blood loss as he does not accept blood products. I declined surgery here at [facility]. However he was offered a referral to [facility] or [facility]  and he would like to have a discussion on prostatectomy with [place] [facility].  - He was also offered a referral to radiation oncology. He would like this referral.  -  We discussed the risk of ADT including impotence, reduced sex drive, fatigue, mood changes, and an increased risk of osteoporosis, cardiovascular disease. We also discussed the risk of radiation including frequent urination, difficult or painful urination, blood in the urine urinary leakage, abdominal cramping and diarrhea.  4. CKD stage III/solitary kidney - Kidney function has decreased will refer him to nephrology to discuss what can be done to protect his kidney, optimization of medication, and dietary  guidelines.   I,Kailey Littlejohn,acting as a Education administrator for Hollice Espy, MD.,have documented all relevant documentation on the behalf of Hollice Espy, MD,as directed by  Hollice Espy, MD while in the presence of Hollice Espy, MD.  I have reviewed the above documentation for accuracy and completeness, and I agree with the above.   Hollice Espy, MD   [facility] [address] [place], [place] [address] [phone]  I spent 60 total minutes on the day of the encounter including pre-visit review of the medical record, face-to-face time with the patient, and post visit ordering of labs/imaging/tests.  The majority of this visit was spent discussing his upcoming surgery, ureteral stent as well as options for prostate cancer.

## 2021-08-28 NOTE — Patient Instructions (Signed)
Leuprolide Suspension for Injection (Prostate Cancer) What is this medication? LEUPROLIDE (loo PROE lide) reduces the symptoms of prostate cancer. It works by decreasing levels of the hormone testosterone in the body. This prevents prostate cancer cells from spreading or growing. This medicine may be used for other purposes; ask your health care provider or pharmacist if you have questions. COMMON BRAND NAME(S): Eligard, Fensolvi, Lupron Depot, Lupron Depot-Ped, Lutrate Depot, Viadur What should I tell my care team before I take this medication? They need to know if you have any of these conditions: Diabetes Heart disease Heart failure High or low levels of electrolytes, such as magnesium, potassium, or sodium in your blood Irregular heartbeat or rhythm Seizures An unusual or allergic reaction to leuprolide, other medications, foods, dyes, or preservatives Pregnant or trying to get pregnant Breast-feeding How should I use this medication? This medication is injected under the skin or into a muscle. It is given by your care team in a hospital or clinic setting. Talk to your care team about the use of this medication in children. Special care may be needed. Overdosage: If you think you have taken too much of this medicine contact a poison control center or emergency room at once. NOTE: This medicine is only for you. Do not share this medicine with others. What if I miss a dose? Keep appointments for follow-up doses. It is important not to miss your dose. Call your care team if you are unable to keep an appointment. What may interact with this medication? Do not take this medication with any of the following: Cisapride Dronedarone Ketoconazole Levoketoconazole Pimozide Thioridazine This medication may also interact with the following: Other medications that cause heart rhythm changes This list may not describe all possible interactions. Give your health care provider a list of all the  medicines, herbs, non-prescription drugs, or dietary supplements you use. Also tell them if you smoke, drink alcohol, or use illegal drugs. Some items may interact with your medicine. What should I watch for while using this medication? Visit your care team for regular checks on your progress. Tell your care team if your symptoms do not start to get better or if they get worse. This medication may increase blood sugar. The risk may be higher in patients who already have diabetes. Ask your care team what you can do to lower the risk of diabetes while taking this medication. This medication may cause infertility. Talk to your care team if you are concerned about your fertility. Heart attacks and strokes have been reported with the use of this medication. Get emergency help if you develop signs or symptoms of a heart attack or stroke. Talk to your care team about the risks and benefits of this medication. What side effects may I notice from receiving this medication? Side effects that you should report to your care team as soon as possible: Allergic reactions--skin rash, itching, hives, swelling of the face, lips, tongue, or throat Heart attack--pain or tightness in the chest, shoulders, arms, or jaw, nausea, shortness of breath, cold or clammy skin, feeling faint or lightheaded Heart rhythm changes--fast or irregular heartbeat, dizziness, feeling faint or lightheaded, chest pain, trouble breathing High blood sugar (hyperglycemia)--increased thirst or amount of urine, unusual weakness or fatigue, blurry vision Mood swings, irritability, hostility Seizures Stroke--sudden numbness or weakness of the face, arm, or leg, trouble speaking, confusion, trouble walking, loss of balance or coordination, dizziness, severe headache, change in vision Thoughts of suicide or self-harm, worsening mood, feelings of depression  Side effects that usually do not require medical attention (report to your care team if they  continue or are bothersome): Bone pain Change in sex drive or performance General discomfort and fatigue Hot flashes Muscle pain Pain, redness, or irritation at injection site Swelling of the ankles, hands, or feet This list may not describe all possible side effects. Call your doctor for medical advice about side effects. You may report side effects to FDA at 1-800-FDA-1088. Where should I keep my medication? This medication is given in a hospital or clinic. It will not be stored at home. NOTE: This sheet is a summary. It may not cover all possible information. If you have questions about this medicine, talk to your doctor, pharmacist, or health care provider.  2023 Elsevier/Gold Standard (2021-03-05 00:00:00)

## 2021-08-29 ENCOUNTER — Other Ambulatory Visit: Payer: Self-pay

## 2021-08-29 NOTE — Progress Notes (Addendum)
[facility] Posting Form   Surgery Date/Time: Date: 10/09/2021  Surgeon: Dr. Hollice Espy, MD  Surgery Location: Day Surgery  Inpt ( No  )   Outpt (Yes)   Obs ( No  )   -CPT: 16010  Surgery: Right Ureteral Stent Placement  *Orders entered into EPIC  Date: 08/29/21   *Case booked in EPIC  Date: 08/21/2021  *Notified pt of Surgery: Date: 08/21/2021  *Placed into Prior Authorization Work Fabio Bering Date: 08/29/21   Assistant/laser/rep:No

## 2021-09-02 ENCOUNTER — Encounter (INDEPENDENT_AMBULATORY_CARE_PROVIDER_SITE_OTHER): Payer: Self-pay

## 2021-09-03 ENCOUNTER — Encounter: Payer: Self-pay | Admitting: Radiation Oncology

## 2021-09-03 ENCOUNTER — Ambulatory Visit
Admission: RE | Admit: 2021-09-03 | Discharge: 2021-09-03 | Disposition: A | Discharge status: Home / Self Care | Payer: BC Managed Care – PPO | Source: Ambulatory Visit | Attending: Radiation Oncology | Admitting: Radiation Oncology

## 2021-09-03 VITALS — BP 135/76 | HR 88 | Temp 97.6°F | Resp 16 | Ht 70.0 in | Wt 219.0 lb

## 2021-09-03 DIAGNOSIS — I10 Essential (primary) hypertension: Secondary | ICD-10-CM | POA: Insufficient documentation

## 2021-09-03 DIAGNOSIS — C7A1 Malignant poorly differentiated neuroendocrine tumors: Secondary | ICD-10-CM | POA: Insufficient documentation

## 2021-09-03 DIAGNOSIS — Z803 Family history of malignant neoplasm of breast: Secondary | ICD-10-CM | POA: Diagnosis not present

## 2021-09-03 DIAGNOSIS — Z79899 Other long term (current) drug therapy: Secondary | ICD-10-CM | POA: Insufficient documentation

## 2021-09-03 DIAGNOSIS — Z8042 Family history of malignant neoplasm of prostate: Secondary | ICD-10-CM | POA: Diagnosis not present

## 2021-09-03 DIAGNOSIS — K449 Diaphragmatic hernia without obstruction or gangrene: Secondary | ICD-10-CM | POA: Insufficient documentation

## 2021-09-03 DIAGNOSIS — C61 Malignant neoplasm of prostate: Secondary | ICD-10-CM | POA: Diagnosis not present

## 2021-09-03 DIAGNOSIS — C642 Malignant neoplasm of left kidney, except renal pelvis: Secondary | ICD-10-CM

## 2021-09-03 NOTE — Consult Note (Signed)
NEW PATIENT EVALUATION  Name: Patrick Pittman  MRN: [id number]  Date:   09/03/2021     DOB: 08-25-1960   This 61 y.o. male patient presents to the clinic for initial evaluation of stage IIIb (cT3 aN0 M0) mostly Gleason 6 (3+3) adenocarcinoma the prostate presenting with a PSA in the 12 range.  REFERRING PHYSICIAN: Theotis Burrow*  CHIEF COMPLAINT:  Chief Complaint  Patient presents with   kidney cancer    DIAGNOSIS: The encounter diagnosis was Renal cell carcinoma of left kidney ([place]).   PREVIOUS INVESTIGATIONS:  PSMA PET scan MRI scan and CT scans reviewed Pathology reports reviewed Clinical notes reviewed  HPI: Patient is a 61 year old male whose PSA is climbed from 6 2 years ago to 12.0 this month.  He had a prostate biopsy back in February 2021 showing 5 of 12 cores involved with low-grade Gleason 63+3) adenocarcinoma the prostate with a 117 g prostate.  In February he had a MRI of his prostate gland showing anterior tumor in the left paramidline to apical transitional zone bulging the capsule suspicious for extracapsular extension a PI-RADS category 5 lesion.  There was no evidence of metastatic disease in his pelvis or adenopathy.  He was noted to have an ovoid structure anterior to the right psoas muscle and adjacent to the sigmoid suspicious for either an appendiceal or rectosigmoid tumor.  Fusion biopsy in March 2023 showed again 1 core of Gleason 7 (3+4) adenocarcinoma and Gleason 6 adenocarcinoma involving 2 cores at the left apex and right apex.  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  Bone scan in April also showed uptake in the right orbit and anterior left iliac bone suspicious for osseous metastatic disease although this was not confirmed later on PSMA PET scan which only showed tumor confined to the  prostate. .  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  He was taken to surgery by Dr. Erlene Quan had a laparoscopic assisted radical nephrectomy on the left back in July.  At the same time surgical pathology showed a neuroendocrine tumor grade one of the appendix that was associated with a sessile serrated polyp.  The renal mass was a renal oncocytic neoplasm benign and completely excised.  Patient is scheduled to go undergo resurgery with Dr. Hampton Abbot with stent placement performed by urology as well as colon resection.  He is scheduled for second opinion at [facility] for possible prostate resection.  He is seen today and is asymptomatic from a urologic standpoint specifically denies frequency urgency nocturia.  His bowels are also of normal function. PLANNED TREATMENT REGIMEN: Possible IMRT radiation therapy to his prostate should he forego surgical resection  PAST MEDICAL HISTORY:  has a past medical history of Cancer ([place]), History of hiatal hernia, Hypertension, and No blood products.    PAST SURGICAL HISTORY:  Past Surgical History:  Procedure Laterality Date   COLONOSCOPY     COLONOSCOPY WITH PROPOFOL N/A 05/23/2021   Procedure: COLONOSCOPY WITH PROPOFOL;  Surgeon: [name], MD;  Location: [facility] ENDOSCOPY;  Service: Gastroenterology;  Laterality: N/A;   LAPAROSCOPIC APPENDECTOMY N/A 07/23/2021   Procedure: APPENDECTOMY LAPAROSCOPIC HAND ASSISTED;  Surgeon: Olean Ree, MD;  Location: [facility] ORS;  Service: General;  Laterality: N/A;   LAPAROSCOPIC NEPHRECTOMY, HAND ASSISTED Left 07/23/2021   Procedure: HAND ASSISTED LAPAROSCOPIC RADICAL NEPHRECTOMY;  Surgeon: Hollice Espy, MD;  Location: [facility] ORS;  Service: Urology;  Laterality: Left;   RESECTION DISTAL CLAVICAL Right 07/26/2015   Procedure: Open excision of right distal clavicle.;  Surgeon: Corky Mull, MD;  Location: [place];  Service: Orthopedics;  Laterality: Right;   TAKE DOWN OF INTESTINAL FISTULA   07/23/2021   Procedure: TAKE DOWN OF INTESTINAL FISTULA;  Surgeon: Olean Ree, MD;  Location: [facility] ORS;  Service: General;;    FAMILY HISTORY: family history includes Breast cancer in his mother; Prostate cancer in his brother, brother, brother, and father; Prostate cancer (age of onset: 37) in his nephew; Prostate cancer (age of onset: 58) in his brother.  SOCIAL HISTORY:  reports that he has never smoked. He has never been exposed to tobacco smoke. He has never used smokeless tobacco. He reports that he does not currently use alcohol. He reports that he does not use drugs.  ALLERGIES: Patient has no known allergies.  MEDICATIONS:  Current Outpatient Medications  Medication Sig Dispense Refill   loratadine (CLARITIN) 10 MG tablet Take 10 mg by mouth daily.     sildenafil (REVATIO) 20 MG tablet Take 20 mg by mouth. Take 2-5 tablets by mouth as needed     valsartan-hydrochlorothiazide (DIOVAN-HCT) 80-12.5 MG tablet Take 1 tablet by mouth daily.     No current facility-administered medications for this encounter.    ECOG PERFORMANCE STATUS:  0 - Asymptomatic  REVIEW OF SYSTEMS: Patient denies any weight loss, fatigue, weakness, fever, chills or night sweats. Patient denies any loss of vision, blurred vision. Patient denies any ringing  of the ears or hearing loss. No irregular heartbeat. Patient denies heart murmur or history of fainting. Patient denies any chest pain or pain radiating to her upper extremities. Patient denies any shortness of breath, difficulty breathing at night, cough or hemoptysis. Patient denies any swelling in the lower legs. Patient denies any nausea vomiting, vomiting of blood, or coffee ground material in the vomitus. Patient denies any stomach pain. Patient states has had normal bowel movements no significant constipation or diarrhea. Patient denies any dysuria, hematuria or significant nocturia. Patient denies any problems walking, swelling in the joints or loss of  balance. Patient denies any skin changes, loss of hair or loss of weight. Patient denies any excessive worrying or anxiety or significant depression. Patient denies any problems with insomnia. Patient denies excessive thirst, polyuria, polydipsia. Patient denies any swollen glands, patient denies easy bruising or easy bleeding. Patient denies any recent infections, allergies or URI. Patient "s visual fields have not changed significantly in recent time.   PHYSICAL EXAM: BP 135/76 (BP Location: Right Arm, Patient Position: Sitting, Cuff Size: Normal)   Pulse 88   Temp 97.6 F (36.4 C) (Tympanic)   Resp 16   Ht '5\' 10"'$  (1.778 m)   Wt 219 lb (99.3 kg)   BMI 31.42 kg/m  Well-developed well-nourished patient in NAD. HEENT reveals PERLA, EOMI, discs not visualized.  Oral cavity is clear. No oral mucosal lesions are identified. Neck is clear without evidence of cervical or supraclavicular adenopathy. Lungs are clear to A&P. Cardiac examination is essentially unremarkable with regular rate and rhythm without murmur rub or thrill. Abdomen is benign with no organomegaly or masses noted. Motor sensory and DTR levels are equal and symmetric in the upper and lower extremities. Cranial nerves II through XII are grossly intact. Proprioception is intact. No peripheral adenopathy or edema is identified. No motor or sensory levels are noted. Crude visual  fields are within normal range.  LABORATORY DATA: Pathology report reviewed compatible with above-stated findings    RADIOLOGY RESULTS: See CT scans MRI scans and PSMA PET scan all reviewed compatible with above-stated findings   IMPRESSION: Stage IIIb (cT3 aN0 M0) adenocarcinoma the prostate presenting with a PSA of 65 in 61 year old male scheduled to undergo repeat surgery for a neuroendocrine tumor of his appendix.  PLAN: This time patient will undergo reexcision of his appendiceal tumor.  Depending on the pathology further treatment options will be  entertained.  He has an appointment at [facility] for consideration of prostatectomy although with the evidence of extracapsular extension him being a Jehovah's Witness they may decline surgical intervention.  Should he opt for radiation therapy based on the size of his prostate would recommend IMRT image guided radiation therapy up to [place] to his prostate.  I have run the [facility] showing a very low incidence of lymph node involvement in his disease.  I would not treat his pelvic lymph nodes.  Risks and benefits of radiation including increased lower urinary tract symptoms possible diarrhea fatigue alteration of blood counts all were reviewed in detail.  I will see the patient back in early November for follow-up after his surgery and make further recommendations at that time.  He should have his consultation at [facility] by that time..  Patient is to call with any concerns.  I would like to take this opportunity to thank you for allowing me to participate in the care of your patient.Noreene Filbert, MD

## 2021-09-04 ENCOUNTER — Ambulatory Visit: Payer: BC Managed Care – PPO | Admitting: Gastroenterology

## 2021-09-12 ENCOUNTER — Inpatient Hospital Stay: Payer: BC Managed Care – PPO | Attending: Oncology | Admitting: Hospice and Palliative Medicine

## 2021-09-12 DIAGNOSIS — C7A8 Other malignant neuroendocrine tumors: Secondary | ICD-10-CM

## 2021-09-12 DIAGNOSIS — C7A020 Malignant carcinoid tumor of the appendix: Secondary | ICD-10-CM | POA: Insufficient documentation

## 2021-09-12 NOTE — Progress Notes (Signed)
Multidisciplinary Oncology Council Documentation  Patrick Pittman was presented by our [facility] on 09/12/2021, which included representatives from:  Palliative Care Dietitian  Physical/Occupational Therapist Nurse Navigator Genetics Speech Therapist Social work Survivorship RN Financial Navigator Research RN   Patrick Pittman currently presents with history of neuroendocrine tumor of appendix  We reviewed previous medical and familial history, history of present illness, and recent lab results along with all available histopathologic and imaging studies. The [place] considered available treatment options and made the following recommendations/referrals:  SW  The MOC is a meeting of clinicians from various specialty areas who evaluate and discuss patients for whom a multidisciplinary approach is being considered. Final determinations in the plan of care are those of the provider(s).   Today's extended care, comprehensive team conference, Patrick Pittman was not present for the discussion and was not examined.

## 2021-09-17 NOTE — Progress Notes (Signed)
Error

## 2021-09-18 ENCOUNTER — Inpatient Hospital Stay: Payer: BC Managed Care – PPO | Admitting: Licensed Clinical Social Worker

## 2021-09-18 DIAGNOSIS — C7A8 Other malignant neuroendocrine tumors: Secondary | ICD-10-CM

## 2021-09-18 DIAGNOSIS — C642 Malignant neoplasm of left kidney, except renal pelvis: Secondary | ICD-10-CM

## 2021-09-18 NOTE — Progress Notes (Signed)
[place] [facility]  Initial Assessment   Patrick Pittman is a 61 y.o. year old male contacted by phone. Clinical Social Work was referred by medical provider for assessment of psychosocial needs.   SDOH (Social Determinants of Health) assessments performed: Yes SDOH Interventions    Flowsheet Row Clinical Support from 09/18/2021 in [place] [facility] [place] Interventions   Food Insecurity Interventions Intervention Not Indicated  Housing Interventions Intervention Not Indicated  Transportation Interventions Intervention Not Indicated, Patient Resources (Friends/Family)  Utilities Interventions Intervention Not Indicated  Alcohol Usage Interventions Intervention Not Indicated (Score <7)  Depression Interventions/Treatment  Counseling  Financial Strain Interventions Other (Comment)  [Fund Manager]  Physical Activity Interventions Intervention Not Indicated  Stress Interventions Provide Counseling  Social Connections Interventions Intervention Not Indicated       SDOH Screenings   Food Insecurity: No Food Insecurity (09/18/2021)  Housing: Low Risk  (09/18/2021)  Transportation Needs: No Transportation Needs (09/18/2021)  Utilities: Not At Risk (09/18/2021)  Alcohol Screen: Low Risk  (09/18/2021)  Depression (PHQ2-9): Low Risk  (09/18/2021)  Financial Resource Strain: Low Risk  (09/18/2021)  Physical Activity: Inactive (09/18/2021)  Social Connections: Socially Integrated (09/18/2021)  Stress: Stress Concern Present (09/18/2021)  Tobacco Use: Low Risk  (09/03/2021)     Distress Screen completed: No     No data to display            Family/Social Information:  Housing Arrangement: patient lives with main care giver/ Patrick, Pittman (Spouse)  [phone]  Family members/support persons in your life? Family, Friends, Medical laboratory scientific officer, and Geophysical data processor concerns: no  Employment: Working full time Patient recently returned to work after  surgery, has used eight weeks of FMLA, has 4 weeks left, was using short term disability, before returning to work, and stated short-term disability will be active around the same time as his FMLA.  CSW and patient discussed requesting short-term disability for upcoming colon mass surgery, and long-term disability application and process. Income source: Employment and Short-Term Disability Financial concerns: Yes, current concerns., due to loss of work Type of concern:  general financial concerns Food access concerns: no Religious or spiritual practice: Not known Services Currently in place:  BCBS PPO, short-term disability and FMLA (patient has returned to work after an 8 week recuperation period, and will continue to work until upcoming colon surgery on 10/09/2021.  Once patient has surgery he will use remaining FMLA and short-term disability)  Coping/ Adjustment to diagnosis: Patient understands treatment plan and what happens next? yes, but is still pending results from colon mass surgery on 10/09/2021 Concerns about diagnosis and/or treatment: Pain or discomfort during procedures and How I will care for other members of my family, losing my job/income Patient reported stressors: Insurance, Work/ school, Publishing rights manager, Anxiety/ nervousness, Adjusting to my illness, Isolation/ feeling alone, Physical issues, and loss of work Financial controller and/or priorities: N/A Patient enjoys  N/A Current coping skills/ strengths: Ability for insight , Active sense of humor , Average or above average intelligence , Capable of independent living , Armed forces logistics/support/administrative officer , Scientist, research (life sciences) , General fund of knowledge , Motivation for treatment/growth , Supportive family/friends , and Work skills     SUMMARY: Current SDOH Barriers:  Financial constraints related to loss of income due to diagnosis and treatment and Limited social support  Clinical Social Work Clinical Goal(s):  Patient will follow up with HR department and  discuss short-term disability for colon mass* as directed by SW  Interventions: Discussed common feeling  and emotions when being diagnosed with cancer, and the importance of support during treatment Informed patient of the support team roles and support services at [facility] Provided CSW contact information and encouraged patient to call with any questions or concerns Referred patient to Designer, jewellery and Provided patient with information about CSW role in patient care and available resources.  CSW will send patient email with event/group information to [email or web address].   Follow Up Plan: Patient will contact CSW with any support or resource needs Patient verbalizes understanding of plan: Yes    Arnika Larzelere, LCSW

## 2021-09-26 ENCOUNTER — Encounter: Payer: BC Managed Care – PPO | Admitting: Surgery

## 2021-09-27 ENCOUNTER — Other Ambulatory Visit: Payer: Self-pay | Admitting: Nephrology

## 2021-09-27 ENCOUNTER — Encounter
Admission: RE | Admit: 2021-09-27 | Discharge: 2021-09-27 | Disposition: A | Discharge status: Home / Self Care | Payer: BC Managed Care – PPO | Source: Ambulatory Visit | Attending: Surgery | Admitting: Surgery

## 2021-09-27 ENCOUNTER — Other Ambulatory Visit: Payer: BC Managed Care – PPO

## 2021-09-27 DIAGNOSIS — C61 Malignant neoplasm of prostate: Secondary | ICD-10-CM

## 2021-09-27 DIAGNOSIS — N2889 Other specified disorders of kidney and ureter: Secondary | ICD-10-CM

## 2021-09-27 DIAGNOSIS — I10 Essential (primary) hypertension: Secondary | ICD-10-CM

## 2021-09-27 DIAGNOSIS — Z8042 Family history of malignant neoplasm of prostate: Secondary | ICD-10-CM

## 2021-09-27 NOTE — Patient Instructions (Signed)
Your procedure is scheduled on: 10/09/21 Report to [place]. To find out your arrival time please call [phone] between 1PM - 3PM on 10/08/21.  Remember: Instructions that are not followed completely may result in serious medical risk, up to and including death, or upon the discretion of your surgeon and anesthesiologist your surgery may need to be rescheduled.     _X__ 1. Diet as instructed per Dr Hampton Abbot  __X__2.  On the morning of surgery brush your teeth with toothpaste and water, you                 may rinse your mouth with mouthwash if you wish.  Do not swallow any              toothpaste of mouthwash.     _X__ 3.  No Alcohol for 24 hours before or after surgery.   _X__ 4.  Do Not Smoke or use e-cigarettes For 24 Hours Prior to Your Surgery.                 Do not use any chewable tobacco products for at least 6 hours prior to                 surgery.  ____  5.  Bring all medications with you on the day of surgery if instructed.   __X__  6.  Notify your doctor if there is any change in your medical condition      (cold, fever, infections).     Do not wear jewelry, make-up, hairpins, clips or nail polish. Do not wear lotions, powders, or perfumes. No deodorant Do not shave body hair 48 hours prior to surgery. Men may shave face and neck. Do not bring valuables to the hospital.    [facility] is not responsible for any belongings or valuables.  Contacts, dentures/partials or body piercings may not be worn into surgery. Bring a case for your contacts, glasses or hearing aids, a denture cup will be supplied. Leave your suitcase in the car. After surgery it may be brought to your room. For patients admitted to the hospital, discharge time is determined by your treatment team.   Patients discharged the day of surgery will not be allowed to drive home.   Please read over the following fact sheets that you were given:    MRSA Information, CHG soap  __X__ Take these medicines the morning of surgery with A SIP OF WATER:    1. none  2.   3.   4.  5.  6.  ____ Fleet Enema (as directed)   __X__ Use CHG Soap/SAGE wipes as directed  ____ Use inhalers on the day of surgery  ____ Stop metformin/Janumet/Farxiga 2 days prior to surgery    ____ Take 1/2 of usual insulin dose the night before surgery. No insulin the morning          of surgery.   ____ Stop Blood Thinners Coumadin/Plavix/Xarelto/Pleta/Pradaxa/Eliquis/Effient/Aspirin  on   Or contact your Surgeon, Cardiologist or Medical Doctor regarding  ability to stop your blood thinners  __X__ Stop Anti-inflammatories 7 days before surgery such as Advil, Ibuprofen, Motrin,  BC or Goodies Powder, Naprosyn, Naproxen, Aleve, Aspirin   May take Tylenol if needed  __X__ Stop all herbals and supplements, fish oil or vitamins  until after surgery.    ____ Bring C-Pap to the hospital.

## 2021-09-28 ENCOUNTER — Encounter: Payer: Self-pay | Admitting: Surgery

## 2021-09-28 ENCOUNTER — Other Ambulatory Visit: Payer: BC Managed Care – PPO

## 2021-09-28 ENCOUNTER — Ambulatory Visit (INDEPENDENT_AMBULATORY_CARE_PROVIDER_SITE_OTHER): Payer: BC Managed Care – PPO | Admitting: Surgery

## 2021-09-28 VITALS — BP 143/90 | HR 73 | Temp 98.3°F | Wt 219.4 lb

## 2021-09-28 DIAGNOSIS — C7A8 Other malignant neuroendocrine tumors: Secondary | ICD-10-CM | POA: Diagnosis not present

## 2021-09-28 DIAGNOSIS — K383 Fistula of appendix: Secondary | ICD-10-CM

## 2021-09-28 DIAGNOSIS — K388 Other specified diseases of appendix: Secondary | ICD-10-CM

## 2021-09-28 NOTE — Progress Notes (Signed)
09/28/2021  History of Present Illness: Patrick Pittman is a 61 y.o. male presents for follow-up and H&P update in preparation for surgery on 10/09/2021.  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  CT scan also found a left renal mass and the patient underwent a laparoscopic hand-assisted left nephrectomy and appendectomy with Dr. Erlene Quan and myself on 07/23/2021.  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  The proximal staple line going to the cecum was negative for cancer but the distal staple line at the coloappendiceal fistula was positive for tumor cells.  As such, the plan was for an open right colectomy for the neuroendocrine tumor itself as well as a sigmoidectomy to get clear margins given the positive cells at the staple line.  The patient is scheduled for surgery 10/09/2021 and he is ready for this.  He denies any abdominal pain, nausea, vomiting.  He is eating well and feels that he is gaining some of his weight back.  Past Medical History: Past Medical History:  Diagnosis Date   Cancer ([place])    History of hiatal hernia    Hypertension    No blood products    Jehovah's witness     Past Surgical History: Past Surgical History:  Procedure Laterality Date   COLONOSCOPY     COLONOSCOPY WITH PROPOFOL N/A 05/23/2021   Procedure: COLONOSCOPY WITH PROPOFOL;  Surgeon: [name], MD;  Location: [facility] ENDOSCOPY;  Service: Gastroenterology;  Laterality: N/A;   LAPAROSCOPIC APPENDECTOMY N/A 07/23/2021   Procedure: APPENDECTOMY LAPAROSCOPIC HAND ASSISTED;  Surgeon: Olean Ree, MD;  Location: [facility] ORS;  Service: General;  Laterality: N/A;   LAPAROSCOPIC NEPHRECTOMY, HAND ASSISTED Left 07/23/2021   Procedure: HAND ASSISTED LAPAROSCOPIC RADICAL NEPHRECTOMY;  Surgeon: Hollice Espy, MD;  Location: [facility] ORS;  Service: Urology;  Laterality: Left;   RESECTION DISTAL CLAVICAL Right 07/26/2015   Procedure: Open excision of right distal clavicle.;  Surgeon: Corky Mull, MD;  Location: [place];  Service: Orthopedics;  Laterality: Right;   TAKE DOWN OF INTESTINAL FISTULA  07/23/2021   Procedure: TAKE DOWN OF INTESTINAL FISTULA;  Surgeon: Olean Ree, MD;  Location: [facility] ORS;  Service: General;;    Home Medications: Prior to Admission medications   Medication Sig Start Date End Date Taking? Authorizing Provider  loratadine (CLARITIN) 10 MG tablet Take 10 mg by mouth daily. 05/24/21  Yes [provider]  sildenafil (REVATIO) 20 MG tablet Take 20 mg by mouth. Take 2-5 tablets by mouth as needed   Yes [provider]  valsartan-hydrochlorothiazide (DIOVAN-HCT) 80-12.5 MG tablet Take 1 tablet by mouth daily. 02/08/21  Yes [provider]    Allergies: No Known Allergies  Review of Systems: Review of Systems  Constitutional:  Negative for chills and fever.  HENT:  Negative for hearing loss.   Respiratory:  Negative for shortness of breath.   Cardiovascular:  Negative for chest pain.  Gastrointestinal:  Negative for abdominal pain, nausea and vomiting.  Genitourinary:  Negative for dysuria.  Musculoskeletal:  Negative for myalgias.  Skin:  Negative for rash.  Neurological:  Negative for dizziness.  Psychiatric/Behavioral:  Negative for depression.     Physical Exam BP (!) 143/90   Pulse 73   Temp 98.3 F (36.8 C) (Oral)   Wt 219 lb 6.4 oz (99.5  kg)   SpO2 97%   BMI 31.04 kg/m  CONSTITUTIONAL: No acute distress, well-nourished HEENT:  Normocephalic, atraumatic, extraocular motion intact. NECK: Trachea is midline, no jugular venous distention. RESPIRATORY:  Lungs are clear, and breath sounds are equal bilaterally. Normal respiratory effort without pathologic use of accessory muscles. CARDIOVASCULAR: Heart is regular  without murmurs, gallops, or rubs. GI: The abdomen is soft, nondistended, nontender to palpation.  He has very well-healed scars from his surgery. MUSCULOSKELETAL: Normal gait, no peripheral edema. NEUROLOGIC:  Motor and sensation is grossly normal.  Cranial nerves are grossly intact. PSYCH:  Alert and oriented to person, place and time. Affect is normal.  Labs/Imaging: Labs from 08/02/2021: Sodium 136, potassium 4.6, chloride 105, CO2 25, BUN 29, creatinine 1.97.  Total bilirubin 0.8, AST 44, ALT 76, alkaline phosphatase 59, albumin 3.6.  WBC 7.4, hemoglobin 14.6, hematocrit 43.8, platelets 296.  Assessment and Plan: This is a 61 y.o. 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  - Discussed with patient that were ready for surgery for him in 10/09/2021 and would do a open right colectomy and sigmoidectomy.  This would mean to anastomosis and there is a chance that we will need to do a diverting loop ileostomy at the same time.  However both anastomosis looked very healthy and there have been no complications, may be able to do only the anastomosis without diversion.  Discussed with him the surgical plan at length including the incision, risks of bleeding, infection, injury to surrounding structures, hospital stay, pain control, activity restrictions, and he is willing to proceed. - Patient will need a bowel prep and we will send prescriptions for this. - All of his questions have been answered.  I spent 40 minutes dedicated to the care of this patient on the date of this encounter to include pre-visit review of records, face-to-face time with the patient discussing diagnosis and management, and any post-visit coordination of care.   [name], [facility]

## 2021-09-28 NOTE — Patient Instructions (Addendum)
If you have any concerns or questions, please feel free to call our office.    Minimally Invasive Total Colectomy, Adult  Minimally invasive total colectomy is surgery to remove all of the large intestine (colon), leaving the small intestine and the rectum. Minimally invasive surgery is done through several small incisions in the abdomen instead of one large incision (open surgery). This procedure is also called laparoscopic total abdominal colectomy. Sometimes, a robot can be used to control the tools used in the surgery. In such a case, the procedure is called robot-assisted laparoscopic surgery. This procedure may be used to treat several conditions that affect the colon, including: Tumors or masses. Bleeding. Blockages or obstruction. Inflammatory bowel diseases, such as Crohn's disease, ulcerative colitis, or diverticulitis. Cancer. Tell a health care provider about: Any allergies you have. All medicines you are taking, including vitamins, herbs, eye drops, creams, and over-the-counter medicines. Any problems you or family members have had with anesthetic medicines. Any bleeding problems you have. Any surgeries you have had. Any medical conditions you have. Whether you are pregnant or may be pregnant. If you have a current history of: Nicotine or tobacco use. Alcohol or drug use. What are the risks? Your health care provider will talk with you about risks. These may include: Infection. Bleeding. Allergic reactions to medicines or dyes. Damage to nearby structures or organs. Future blockage of the intestines from scar tissue. Another surgery may be needed to repair this. Leaking from where the small intestine and rectum were sewn together, if this applies. Needing to change to an open procedure. A blood clot that forms in a leg, then breaks loose and travels to the lungs (pulmonary embolism). What happens before the surgery? Eating and drinking Follow instructions from your  health care provider about what you can eat and drink before your procedure. These may include: 8 hours before your procedure Stop eating most foods. Do not eat meat, fried foods, or fatty foods. Eat only light foods, such as toast or crackers. All liquids are okay except energy drinks and alcohol. 6 hours before your procedure Stop eating. Drink only clear liquids such as water, clear fruit juice, black coffee, plain tea, and sports drinks. Do not drink energy drinks or alcohol. 2 hours before your procedure Stop drinking all liquids. You may be allowed to take medicines with small sips of water. If you do not follow your health care provider's instructions, your procedure may be delayed or cancelled. Medicines You may be asked to take a medicine to empty your colon (bowel preparation). Follow instructions from your health care provider about how to do this. Do not eat or drink anything else after you have started the bowel prep, unless your health care provider tells you it is safe to do so. Ask your health care provider about: Changing or stopping your regular medicines. These include any diabetes medicines or blood thinners you take. Taking medicines such as aspirin and ibuprofen. These medicines can thin your blood. Do not take them unless your health care provider tells you to. Taking over-the-counter medicines, vitamins, herbs, and supplements. Surgery safety Ask your health care provider: How your surgery site will be marked. What steps will be taken to help prevent infection. These may include: Removing hair at the surgery site. Washing skin with soap that kills germs. Taking antibiotics to clean out bacteria from your colon. Follow the directions carefully and take the medicine at the correct time. General instructions Do not use any products that contain  nicotine or tobacco for at least 4 weeks before the procedure. These products include cigarettes, chewing tobacco, and  vaping devices, such as e-cigarettes. If you need help quitting, ask your health care provider. Plan to have a responsible adult take you home from the hospital. What happens during the surgery?  An IV will be inserted into one of your veins. You may be given: A sedative. This helps you relax. Anesthesia. This will: Numb certain areas of your body. Make you fall asleep for surgery. Several small incisions will be made in your abdomen. Surgical instruments called trocars will be inserted into the small incisions. A thin metal scope with a light and camera on the end (laparoscope) will be inserted into one of the trocars. The camera on the laparoscope will send a picture to a computer screen. If needed, a surgical robot will be used to control the laparoscope and the instruments from a computer console (robot-assisted laparoscopic procedure). The images from the laparoscope will appear on the console. Your abdomen will be filled with carbon dioxide gas. This enables the surgeon to view the area more clearly and have more room for the surgery. Clamps or staples will be put on the end of the small intestine and the end of the colon, at the rectum. The part of the intestine between the clamps or staples will be removed. The end of the small intestine will be stitched (sutured) or stapled to the top of the rectum to allow your body to pass waste (stool). If the remaining intestines cannot be stitched back together, you will have a procedure called an ileostomy. This creates a new opening for stool to leave your body. The incisions will then be closed with sutures, skin glue, or adhesive strips. A bandage (dressing) may be placed over the incisions. The procedure may vary among health care providers and hospitals. What happens after the surgery? Your blood pressure, heart rate, breathing rate, and blood oxygen level will be monitored until you leave the hospital or clinic. You will receive fluids  through an IV until your bowels start to work properly. You will be given medicines to control your pain and nausea, if needed. You may have to wear compression stockings. These stockings help to prevent blood clots and reduce swelling in your legs. Once your bowels are working again, you will be given clear liquids first and then solid food as tolerated. You will be helped to get up and walk around as soon as possible. Summary Minimally invasive total colectomy is surgery to remove all of the large intestine (colon) from the abdomen. Tell your health care provider about all medical conditions you have and all medicines you are taking. Follow all instructions before the procedure, including when to stop eating and drinking, and whether to change or stop any medicines. A robot is sometimes used to help with the surgery. You may have to take antibiotics to clean out bacteria from your colon. Follow the directions carefully and take the medicine at the correct time. This information is not intended to replace advice given to you by your health care provider. Make sure you discuss any questions you have with your health care provider. Document Revised: 04/11/2021 Document Reviewed: 04/11/2021 Elsevier Patient Education  [place].

## 2021-10-01 ENCOUNTER — Telehealth: Payer: Self-pay

## 2021-10-01 MED ORDER — NEOMYCIN SULFATE 500 MG PO TABS: ORAL_TABLET | ORAL | 0 refills | Status: DC

## 2021-10-01 MED ORDER — METRONIDAZOLE 500 MG PO TABS: ORAL_TABLET | ORAL | 0 refills | Status: DC

## 2021-10-01 MED ORDER — POLYETHYLENE GLYCOL 3350 17 GM/SCOOP PO POWD: 1.0000 | Freq: Once | ORAL | 0 refills | Status: AC

## 2021-10-01 MED ORDER — BISACODYL 5 MG PO TBEC: 5.0000 mg | DELAYED_RELEASE_TABLET | Freq: Once | ORAL | 0 refills | Status: AC

## 2021-10-01 NOTE — Telephone Encounter (Signed)
Tried reaching patient again- left message to call office regarding bowel prep instructions and that medication has been sent to his pharmacy.

## 2021-10-01 NOTE — Telephone Encounter (Signed)
Left message for patient to return call to office-   Bowel prep medications have been sent to pharmacy- bowel prep instructions sheet needs to be given to patient and discussed in detailed. Information was not provided at the time of visit. Please have patient come pick up bowel prep sheet.

## 2021-10-02 ENCOUNTER — Inpatient Hospital Stay
Admission: RE | Admit: 2021-10-02 | Discharge: 2021-10-02 | Disposition: A | Discharge status: Home / Self Care | Payer: BC Managed Care – PPO | Source: Ambulatory Visit

## 2021-10-02 DIAGNOSIS — C7A020 Malignant carcinoid tumor of the appendix: Secondary | ICD-10-CM | POA: Diagnosis not present

## 2021-10-02 NOTE — Consult Note (Signed)
WOC Nurse Consult Note: WOC Nurse requested for preoperative stoma site marking by Dr. Hampton Abbot.  Discussed surgical procedure and stoma creation with patient.  Explained role of the [place] nurse team.  Answered patient questions.   Examined patient sitting and standing in order to place the marking in the patient's visual field, away from any creases or abdominal contour issues and within the rectus muscle.  Attempted to mark below the patient's belt line.   Marked for ileostomy in the RUQ  6.5cm to the right of the umbilicus and  4cm above the umbilicus.  Patient's abdomen cleansed with CHG wipe at site marking, allowed to air dry prior to marking. Covered mark with thin film transparent dressing to preserve mark until date of surgery, DOS is 10/09/21.   [place] Nurse team will follow up with patient after surgery for continue ostomy care and teaching.   Thank you for inviting [place] to participate in this patient's Plan of Care.  Maudie Flakes, MSN, RN, CNS, [place], Serita Grammes, [employer], [employer] phone:  [phone]

## 2021-10-08 ENCOUNTER — Encounter: Payer: Self-pay | Admitting: Anesthesiology

## 2021-10-08 ENCOUNTER — Telehealth: Payer: Self-pay

## 2021-10-08 NOTE — Anesthesia Preprocedure Evaluation (Deleted)
Anesthesia Evaluation  Patient identified by MRN, date of birth, ID band Patient awake    Reviewed: Allergy & Precautions, NPO status , Patient's Chart, lab work & pertinent test results  History of Anesthesia Complications Negative for: history of anesthetic complications  Airway Mallampati: IV   Neck ROM: Full    Dental  (+) Missing   Pulmonary neg pulmonary ROS,    Pulmonary exam normal breath sounds clear to auscultation       Cardiovascular hypertension, Normal cardiovascular exam Rhythm:Regular Rate:Normal  ECG 07/18/21: normal   Neuro/Psych negative neurological ROS     GI/Hepatic hiatal hernia, neuroendocrine tumor of appendix   Endo/Other  negative endocrine ROS  Renal/GU 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   Prostate CA    Musculoskeletal  (+) Arthritis ,   Abdominal   Peds  Hematology  (+) JEHOVAH'S WITNESS (albumin and cell saver acceptable)  Anesthesia Other Findings   Reproductive/Obstetrics                             Anesthesia Physical  Anesthesia Plan  ASA: 2  Anesthesia Plan: General   Post-op Pain Management:    Induction: Intravenous  PONV Risk Score and Plan: 3 and Ondansetron, Dexamethasone, Midazolam and Droperidol  Airway Management Planned: Oral ETT  Additional Equipment:   Intra-op Plan:   Post-operative Plan: Extubation in OR  Informed Consent:   Plan Discussed with: CRNA  Anesthesia Plan Comments: (Patient consented for risks of anesthesia including but not limited to:  - adverse reactions to medications - damage to eyes, teeth, lips or other oral mucosa - nerve damage due to positioning  - sore throat or hoarseness - damage to heart, brain, nerves, lungs, other parts of body or loss of life  Informed patient about role of CRNA in peri- and intra-operative care.  Patient voiced understanding.)         Anesthesia Quick Evaluation

## 2021-10-08 NOTE — Telephone Encounter (Signed)
I spoke with [facility] @ [facility] center in regards to medical clearance-I let her know surgery is scheduled tomorrow and if the medical clearance is not received surgery may be cancelled. She stated the provider is in office today and can hopefully get it signed and faxed back. Fax number provided.

## 2021-10-08 NOTE — Telephone Encounter (Signed)
Medical clearance received from [place]-low risk- patient optimized for surgery.

## 2021-10-09 ENCOUNTER — Ambulatory Visit
Admission: RE | Admit: 2021-10-09 | Discharge: 2021-10-09 | Disposition: A | Discharge status: Home / Self Care | Payer: BC Managed Care – PPO | Attending: Surgery | Admitting: Surgery

## 2021-10-09 ENCOUNTER — Encounter
Admission: RE | Disposition: A | Discharge status: Home / Self Care | Payer: Self-pay | Source: Home / Self Care | Attending: Surgery

## 2021-10-09 ENCOUNTER — Encounter: Payer: Self-pay | Admitting: Surgery

## 2021-10-09 ENCOUNTER — Other Ambulatory Visit: Payer: Self-pay

## 2021-10-09 DIAGNOSIS — C61 Malignant neoplasm of prostate: Secondary | ICD-10-CM | POA: Insufficient documentation

## 2021-10-09 DIAGNOSIS — Z01818 Encounter for other preprocedural examination: Secondary | ICD-10-CM | POA: Insufficient documentation

## 2021-10-09 DIAGNOSIS — C7A8 Other malignant neuroendocrine tumors: Principal | ICD-10-CM

## 2021-10-09 SURGERY — COLECTOMY, RIGHT
Anesthesia: General | Laterality: Right

## 2021-10-09 MED ORDER — GABAPENTIN 300 MG PO CAPS: 300.0000 mg | ORAL_CAPSULE | ORAL | Status: AC

## 2021-10-09 MED ORDER — FAMOTIDINE 20 MG PO TABS: 20.0000 mg | ORAL_TABLET | Freq: Once | ORAL | Status: AC

## 2021-10-09 MED ORDER — ACETAMINOPHEN 500 MG PO TABS
ORAL_TABLET | ORAL | Status: AC
  Administered 2021-10-09: 1000 mg via ORAL
  Filled 2021-10-09: qty 2

## 2021-10-09 MED ORDER — CHLORHEXIDINE GLUCONATE CLOTH 2 % EX PADS: 6.0000 | MEDICATED_PAD | Freq: Once | CUTANEOUS | Status: DC

## 2021-10-09 MED ORDER — BUPIVACAINE-EPINEPHRINE (PF) 0.25% -1:200000 IJ SOLN
INTRAMUSCULAR | Status: AC
  Filled 2021-10-09: qty 30

## 2021-10-09 MED ORDER — NEOMYCIN SULFATE 500 MG PO TABS: ORAL_TABLET | ORAL | 0 refills | Status: DC

## 2021-10-09 MED ORDER — SODIUM CHLORIDE 0.9 % IV SOLN
INTRAVENOUS | Status: AC
  Filled 2021-10-09: qty 2

## 2021-10-09 MED ORDER — FAMOTIDINE 20 MG PO TABS
ORAL_TABLET | ORAL | Status: AC
  Administered 2021-10-09: 20 mg via ORAL
  Filled 2021-10-09: qty 1

## 2021-10-09 MED ORDER — PROPOFOL 10 MG/ML IV BOLUS
INTRAVENOUS | Status: AC
  Filled 2021-10-09: qty 40

## 2021-10-09 MED ORDER — FENTANYL CITRATE (PF) 100 MCG/2ML IJ SOLN
INTRAMUSCULAR | Status: AC
  Filled 2021-10-09: qty 2

## 2021-10-09 MED ORDER — ORAL CARE MOUTH RINSE: 15.0000 mL | Freq: Once | OROMUCOSAL | Status: AC

## 2021-10-09 MED ORDER — PHENYLEPHRINE HCL (PRESSORS) 10 MG/ML IV SOLN
INTRAVENOUS | Status: AC
  Filled 2021-10-09: qty 1

## 2021-10-09 MED ORDER — BISACODYL 5 MG PO TBEC: DELAYED_RELEASE_TABLET | ORAL | 0 refills | Status: DC

## 2021-10-09 MED ORDER — MIDAZOLAM HCL 2 MG/2ML IJ SOLN
INTRAMUSCULAR | Status: AC
  Filled 2021-10-09: qty 2

## 2021-10-09 MED ORDER — GABAPENTIN 300 MG PO CAPS
ORAL_CAPSULE | ORAL | Status: AC
  Administered 2021-10-09: 300 mg via ORAL
  Filled 2021-10-09: qty 1

## 2021-10-09 MED ORDER — BUPIVACAINE LIPOSOME 1.3 % IJ SUSP: 20.0000 mL | Freq: Once | INTRAMUSCULAR | Status: DC

## 2021-10-09 MED ORDER — CHLORHEXIDINE GLUCONATE 0.12 % MT SOLN: 15.0000 mL | Freq: Once | OROMUCOSAL | Status: AC

## 2021-10-09 MED ORDER — SODIUM CHLORIDE 0.9 % IV SOLN: 2.0000 g | INTRAVENOUS | Status: DC

## 2021-10-09 MED ORDER — LACTATED RINGERS IV SOLN: INTRAVENOUS | Status: DC

## 2021-10-09 MED ORDER — CHLORHEXIDINE GLUCONATE 0.12 % MT SOLN
OROMUCOSAL | Status: AC
  Administered 2021-10-09: 15 mL via OROMUCOSAL
  Filled 2021-10-09: qty 15

## 2021-10-09 MED ORDER — METRONIDAZOLE 500 MG PO TABS: ORAL_TABLET | ORAL | 0 refills | Status: DC

## 2021-10-09 MED ORDER — ALVIMOPAN 12 MG PO CAPS: 12.0000 mg | ORAL_CAPSULE | ORAL | Status: AC

## 2021-10-09 MED ORDER — POLYETHYLENE GLYCOL 3350 17 GM/SCOOP PO POWD: ORAL | 0 refills | Status: DC

## 2021-10-09 MED ORDER — ALVIMOPAN 12 MG PO CAPS
ORAL_CAPSULE | ORAL | Status: AC
  Administered 2021-10-09: 12 mg via ORAL
  Filled 2021-10-09: qty 1

## 2021-10-09 MED ORDER — ACETAMINOPHEN 500 MG PO TABS: 1000.0000 mg | ORAL_TABLET | ORAL | Status: AC

## 2021-10-09 SURGICAL SUPPLY — 65 items
ADAPTER GOLDBERG URETERAL (ADAPTER) IMPLANT
BAG BILE T-TUBES STRL (MISCELLANEOUS) IMPLANT
BAG DRAIN SIEMENS DORNER NS (MISCELLANEOUS) IMPLANT
BAG URINE DRAIN 2000ML AR STRL (UROLOGICAL SUPPLIES) IMPLANT
BAG URO DRAIN 4000ML (MISCELLANEOUS) IMPLANT
BRUSH SCRUB EZ 1% IODOPHOR (MISCELLANEOUS) IMPLANT
CATH FOLEY 2WAY  5CC 16FR (CATHETERS)
CATH FOLEY SIL 2WAY 14FR5CC (CATHETERS) IMPLANT
CATH URETL OPEN 5X70 (CATHETERS) IMPLANT
CATH URTH 16FR FL 2W BLN LF (CATHETERS) IMPLANT
CHLORAPREP W/TINT 26 (MISCELLANEOUS) IMPLANT
DRAPE LAPAROTOMY 100X77 ABD (DRAPES) IMPLANT
DRAPE LEGGINS SURG 28X43 STRL (DRAPES) IMPLANT
DRAPE UNDER BUTTOCK W/FLU (DRAPES) IMPLANT
ELECT CAUTERY BLADE TIP 2.5 (TIP)
ELECT REM PT RETURN 9FT ADLT (ELECTROSURGICAL)
ELECTRODE CAUTERY BLDE TIP 2.5 (TIP) IMPLANT
ELECTRODE REM PT RTRN 9FT ADLT (ELECTROSURGICAL) IMPLANT
GAUZE 4X4 16PLY ~~LOC~~+RFID DBL (SPONGE) IMPLANT
GAUZE SPONGE 4X4 12PLY STRL (GAUZE/BANDAGES/DRESSINGS) IMPLANT
GLOVE BIO SURGEON STRL SZ 6.5 (GLOVE) IMPLANT
GLOVE SURG SYN 7.0 (GLOVE) IMPLANT
GLOVE SURG SYN 7.5  E (GLOVE)
GLOVE SURG SYN 7.5 E (GLOVE) IMPLANT
GOWN STRL REUS W/ TWL LRG LVL3 (GOWN DISPOSABLE) IMPLANT
GOWN STRL REUS W/TWL LRG LVL3 (GOWN DISPOSABLE)
GUIDEWIRE STR DUAL SENSOR (WIRE) IMPLANT
IV NS IRRIG 3000ML ARTHROMATIC (IV SOLUTION) IMPLANT
KIT IMAGING PINPOINTPAQ (MISCELLANEOUS) IMPLANT
KIT TURNOVER CYSTO (KITS) IMPLANT
KIT TURNOVER KIT A (KITS) IMPLANT
LABEL OR SOLS (LABEL) IMPLANT
MANIFOLD NEPTUNE II (INSTRUMENTS) ×2 IMPLANT
NEEDLE HYPO 22GX1.5 SAFETY (NEEDLE) IMPLANT
NS IRRIG 1000ML POUR BTL (IV SOLUTION) IMPLANT
PACK BASIN MAJOR ARMC (MISCELLANEOUS) IMPLANT
PACK COLON CLEAN CLOSURE (MISCELLANEOUS) IMPLANT
PACK CYSTO AR (MISCELLANEOUS) IMPLANT
SEPRAFILM MEMBRANE 5X6 (MISCELLANEOUS) IMPLANT
SET CYSTO W/LG BORE CLAMP LF (SET/KITS/TRAYS/PACK) IMPLANT
SPONGE T-LAP 18X18 ~~LOC~~+RFID (SPONGE) IMPLANT
STAPLER CIRCULAR MANUAL XL 29 (STAPLE) IMPLANT
STAPLER CVD CUT BL 40 RELOAD (ENDOMECHANICALS) IMPLANT
STAPLER PROXIMATE 75MM BLUE (STAPLE) IMPLANT
STAPLER SKIN PROX 35W (STAPLE) IMPLANT
STAPLER SYS INTERNAL RELOAD SS (MISCELLANEOUS) IMPLANT
STENT URET 6FRX24 CONTOUR (STENTS) IMPLANT
STENT URET 6FRX26 CONTOUR (STENTS) IMPLANT
SURGILUBE 2OZ TUBE FLIPTOP (MISCELLANEOUS) IMPLANT
SUT MNCRL 3-0 UNDYED SH (SUTURE) IMPLANT
SUT MONOCRYL 3-0 UNDYED (SUTURE)
SUT PDS AB 1 CT1 27 (SUTURE) IMPLANT
SUT SILK 0 (SUTURE)
SUT SILK 0 30XBRD TIE 6 (SUTURE) IMPLANT
SUT SILK 2-0 (SUTURE) IMPLANT
SUT SILK 3-0 (SUTURE) IMPLANT
SUT VIC AB 1 CTX 27 (SUTURE) IMPLANT
SYR 10ML LL (SYRINGE) IMPLANT
SYR TOOMEY IRRIG 70ML (MISCELLANEOUS)
SYRINGE TOOMEY IRRIG 70ML (MISCELLANEOUS) IMPLANT
TRAP FLUID SMOKE EVACUATOR (MISCELLANEOUS) IMPLANT
TRAY FOLEY MTR SLVR 16FR STAT (SET/KITS/TRAYS/PACK) IMPLANT
TUBING ART PRESS 48 MALE/FEM (TUBING) IMPLANT
WATER STERILE IRR 1000ML POUR (IV SOLUTION) IMPLANT
WATER STERILE IRR 500ML POUR (IV SOLUTION) IMPLANT

## 2021-10-09 NOTE — Progress Notes (Signed)
Surgery canceled. Pt missed some doses of antibiotics the day prior. Surgery to be rescheduled.

## 2021-10-09 NOTE — H&P (Signed)
Preoperative Review   Patient was met in the preoperative holding area. The history was reviewed in the chart and with the patient. I personally reviewed the options and rationale as well as the risks of this procedure that have been previously discussed with the patient.   The patient informed us that he had made a mistake while taking his preoperative bowel prep antibiotics and only took half the prescribed dosage.  Discussed with him the options of proceeding with planned right colectomy and sigmoidectomy, but with a much higher if not definite chance of a diverting loop ileostomy given the higher bacterial count and inappropriate prep, vs delaying the case so he can do a better prep to improve the changes of good anastomosis and no diverting ostomy.  After further discussion, also adding to the picture that he had a recent left nephrectomy, that he also has prostate cancer, and that we would not be planning on any transfusions since he's a Jehovah's witness, we discussed that it would be best to not add another possible factor that can contribute to complications.  He has opted to delay surgery.  We'll reschedule him for 10/30/21.  We'll send new prescriptions for his bowel prep medications, and we'll coordinate with Dr. Cherrie Gauze office so she can help [place] with cystoscopy and right ureteral stent placement.  All questions asked by the patient and/or family were answered to their satisfaction.  [name], MD

## 2021-10-10 ENCOUNTER — Telehealth: Payer: Self-pay | Admitting: Surgery

## 2021-10-10 NOTE — Telephone Encounter (Signed)
Patient has been advised of Pre-Admission date/time, and Surgery date at [facility].  Surgery Date: 10/30/21 Preadmission Testing Date: 10/23/21 in person to arrive at the [facility] @ 9:00 am, preop will be done in person.    Patient has been made aware to call [phone], between 1-3:00pm the day before surgery, to find out what time to arrive for surgery.

## 2021-10-16 NOTE — Addendum Note (Signed)
Addended by: Gerald Leitz A on: 10/16/2021 01:57 PM   Modules accepted: Orders

## 2021-10-18 NOTE — Progress Notes (Deleted)
[facility]  [phone] [phone]  ID: Patrick Pittman OB: 1960-05-28  MR#: [id number]  QIO#:[id number]  Patient Care Team: Theotis Burrow, MD as PCP - General (Family Medicine) Clent Jacks, RN as Oncology Nurse Navigator  CHIEF COMPLAINT: Well-differentiated neuroendocrine tumor of appendix-grade 1, prostate cancer, Gleason's 3+4, Renal oncocytic neoplasm, left.  INTERVAL HISTORY: Patient is a 61 year old male with a history of prostate cancer who recently underwent surgical resection of a kidney mass as well as an appendiceal mass.  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  Patient currently feels well and is asymptomatic.  He has no neurologic complaints.  He denies any recent fevers or illnesses.  He has a good appetite and denies weight loss he has no chest pain, shortness of breath, cough, or hemoptysis.  He denies any nausea, vomiting, constipation, or diarrhea.  He has no abdominal pain.  He has no urinary complaints.  Patient offers no specific complaints today.  REVIEW OF SYSTEMS:   Review of Systems  Constitutional: Negative.  Negative for fever, malaise/fatigue and weight loss.  Respiratory: Negative.  Negative for cough, hemoptysis and shortness of breath.   Cardiovascular: Negative.  Negative for chest pain and leg swelling.  Gastrointestinal: Negative.  Negative for abdominal pain, blood in stool, constipation, diarrhea, melena, nausea and vomiting.  Genitourinary: Negative.  Negative for dysuria, frequency and hematuria.  Musculoskeletal: Negative.  Negative for back pain.  Skin: Negative.  Negative for rash.  Neurological: Negative.  Negative for dizziness, focal weakness, weakness and headaches.  Psychiatric/Behavioral: Negative.  The patient is not nervous/anxious.     As per HPI. Otherwise, a complete review of  systems is negative.  PAST MEDICAL HISTORY: Past Medical History:  Diagnosis Date   Cancer ([place])    History of hiatal hernia    Hypertension    No blood products    Jehovah's witness    PAST SURGICAL HISTORY: Past Surgical History:  Procedure Laterality Date   COLONOSCOPY     COLONOSCOPY WITH PROPOFOL N/A 05/23/2021   Procedure: COLONOSCOPY WITH PROPOFOL;  Surgeon: [name], MD;  Location: [facility] ENDOSCOPY;  Service: Gastroenterology;  Laterality: N/A;   LAPAROSCOPIC APPENDECTOMY N/A 07/23/2021   Procedure: APPENDECTOMY LAPAROSCOPIC HAND ASSISTED;  Surgeon: Olean Ree, MD;  Location: [facility] ORS;  Service: General;  Laterality: N/A;   LAPAROSCOPIC NEPHRECTOMY, HAND ASSISTED Left 07/23/2021   Procedure: HAND ASSISTED LAPAROSCOPIC RADICAL NEPHRECTOMY;  Surgeon: Hollice Espy, MD;  Location: [facility] ORS;  Service: Urology;  Laterality: Left;   RESECTION DISTAL CLAVICAL Right 07/26/2015   Procedure: Open excision of right distal clavicle.;  Surgeon: Corky Mull, MD;  Location: [place];  Service: Orthopedics;  Laterality: Right;   TAKE DOWN OF INTESTINAL FISTULA  07/23/2021   Procedure: TAKE DOWN OF INTESTINAL FISTULA;  Surgeon: Olean Ree, MD;  Location: [facility] ORS;  Service: General;;    FAMILY HISTORY: Family History  Problem Relation Age of Onset   Breast cancer Mother    Prostate cancer Father    Prostate cancer Brother    Prostate cancer Brother 41       metastatic   Prostate cancer Brother    Prostate cancer Brother    Prostate cancer Nephew 19    ADVANCED DIRECTIVES (Y/N):  N  HEALTH MAINTENANCE: Social History   Tobacco Use   Smoking status: Never    Passive exposure: Never  Smokeless tobacco: Never  Vaping Use   Vaping Use: Never used  Substance Use Topics   Alcohol use: Not Currently   Drug use: Never     Colonoscopy:  PAP:  Bone density:  Lipid panel:  No Known Allergies  Current Outpatient Medications  Medication Sig Dispense Refill    bisacodyl (DULCOLAX) 5 MG EC tablet Take all 4 tablets at 8 am the morning prior to your surgery. 4 tablet 0   loratadine (CLARITIN) 10 MG tablet Take 10 mg by mouth daily.     metroNIDAZOLE (FLAGYL) 500 MG tablet Take 2 tablets at 8 AM, take 2 tablets at 2 PM, and take 2 tablets at 8 PM the day prior to your surgery. 6 tablet 0   metroNIDAZOLE (FLAGYL) 500 MG tablet Take 2 tablets at 8AM, take 2 tablets at [facility], and take 2 tablets at 8PM the day prior to your surgery 6 tablet 0   neomycin (MYCIFRADIN) 500 MG tablet Take 2 tablet at 8am, take 2 tablets at 2pm, and take 2 tablets at 8pm the day prior to your surgery 6 tablet 0   neomycin (MYCIFRADIN) 500 MG tablet Take 2 tablet at 8am, take 2 tablets at 2pm, and take 2 tablets at 8pm the day prior to your surgery 6 tablet 0   polyethylene glycol powder (MIRALAX) 17 GM/SCOOP powder Mix full container in 64 ounces of Gatorade or other clear liquid. NO RED 238 g 0   sildenafil (REVATIO) 20 MG tablet Take 20 mg by mouth. Take 2-5 tablets by mouth as needed     valsartan-hydrochlorothiazide (DIOVAN-HCT) 80-12.5 MG tablet Take 1 tablet by mouth daily.     No current facility-administered medications for this visit.    OBJECTIVE: There were no vitals filed for this visit.    There is no height or weight on file to calculate BMI.    ECOG FS:0 - Asymptomatic  General: Well-developed, well-nourished, no acute distress. Eyes: Pink conjunctiva, anicteric sclera. HEENT: Normocephalic, moist mucous membranes. Lungs: No audible wheezing or coughing. Heart: Regular rate and rhythm. Abdomen: Soft, nontender, no obvious distention. Musculoskeletal: No edema, cyanosis, or clubbing. Neuro: Alert, answering all questions appropriately. Cranial nerves grossly intact. Skin: No rashes or petechiae noted. Psych: Normal affect. Lymphatics: No cervical, calvicular, axillary or inguinal LAD.   LAB RESULTS:  Lab Results  Component Value Date   NA 136  08/02/2021   K 4.6 08/02/2021   CL 105 08/02/2021   CO2 25 08/02/2021   GLUCOSE 105 (H) 08/02/2021   BUN 39 (H) 08/02/2021   CREATININE 1.97 (H) 08/02/2021   CALCIUM 9.3 08/02/2021   PROT 7.7 08/02/2021   ALBUMIN 3.6 08/02/2021   AST 44 (H) 08/02/2021   ALT 76 (H) 08/02/2021   ALKPHOS 59 08/02/2021   BILITOT 0.8 08/02/2021   GFRNONAA 38 (L) 08/02/2021    Lab Results  Component Value Date   WBC 7.4 08/02/2021   HGB 14.6 08/02/2021   HCT 43.8 08/02/2021   MCV 85.2 08/02/2021   PLT 296 08/02/2021     STUDIES: No results found.  ASSESSMENT: Well-differentiated neuroendocrine tumor of appendix-grade 1, prostate cancer, Gleason's 3+4, Renal oncocytic neoplasm, left.  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  Patient underwent surgical resection on July 23, 2021 and noted to have a fistula connecting to the sigmoid colon.  Patient also had positive margins at the staple line.  Plan  is to do a reresection on October 09, 2021 in addition to a small resection of patient's sigmoid colon.  It is unlikely he will require adjuvant chemotherapy, but will await final pathology.  Patient has been scheduled to follow-up in mid October to discuss his final pathology and any additional treatment planning necessary. Renal oncocytic neoplasm: Resected with clear margins.  No further intervention is needed. Prostate cancer: Gleason's 3+4.  Given patient's intermediate risk, he will require treatment with either surgery plus ADT therapy or XRT plus ADT therapy.  Patient has an appointment with urology next week to make a final decision.   Patient expressed understanding and was in agreement with this plan. He also understands that He can call clinic at any time with any questions, concerns, or complaints.    Cancer Staging  Neuroendocrine carcinoma of appendix [facility]) Staging form:  Appendix - Neuroendocrine Tumors, AJCC 8th Edition - Clinical stage from 08/21/2021: Stage II (cT3, cN0, cM0) - Signed by Lloyd Huger, MD on 08/21/2021 Histologic grade (G): G1 Histologic grading system: 3 grade system   Lloyd Huger, MD   10/18/2021 2:03 PM

## 2021-10-19 NOTE — Addendum Note (Signed)
Encounter addended by: Nadara Mode, RN on: 10/19/2021 12:01 PM  Actions taken: Clinical Note Signed

## 2021-10-19 NOTE — Consult Note (Signed)
Spoke with patient, he does not need to come in for marking again, marks are still in place from my partners visit with him.  We will follow up after his surgery on 10/30/21.   Re consult if needed, will not follow at this time. Thanks  Kawana Hegel [employer], RN,CWOCN, CNS, [place] [phone])

## 2021-10-23 ENCOUNTER — Encounter
Admission: RE | Admit: 2021-10-23 | Discharge: 2021-10-23 | Disposition: A | Discharge status: Home / Self Care | Payer: BC Managed Care – PPO | Source: Ambulatory Visit | Attending: Surgery | Admitting: Surgery

## 2021-10-23 ENCOUNTER — Encounter: Payer: Self-pay | Admitting: Oncology

## 2021-10-23 ENCOUNTER — Other Ambulatory Visit: Payer: BC Managed Care – PPO

## 2021-10-23 ENCOUNTER — Inpatient Hospital Stay: Payer: BC Managed Care – PPO | Attending: Oncology | Admitting: Oncology

## 2021-10-23 ENCOUNTER — Ambulatory Visit
Admission: RE | Admit: 2021-10-23 | Discharge: 2021-10-23 | Disposition: A | Discharge status: Home / Self Care | Payer: BC Managed Care – PPO | Source: Ambulatory Visit | Attending: Nephrology | Admitting: Nephrology

## 2021-10-23 DIAGNOSIS — Z8042 Family history of malignant neoplasm of prostate: Secondary | ICD-10-CM | POA: Diagnosis present

## 2021-10-23 DIAGNOSIS — I10 Essential (primary) hypertension: Secondary | ICD-10-CM | POA: Diagnosis present

## 2021-10-23 DIAGNOSIS — C7A8 Other malignant neuroendocrine tumors: Secondary | ICD-10-CM

## 2021-10-23 DIAGNOSIS — N2889 Other specified disorders of kidney and ureter: Secondary | ICD-10-CM | POA: Diagnosis present

## 2021-10-23 DIAGNOSIS — C61 Malignant neoplasm of prostate: Secondary | ICD-10-CM

## 2021-10-24 ENCOUNTER — Other Ambulatory Visit: Payer: BC Managed Care – PPO

## 2021-10-30 ENCOUNTER — Other Ambulatory Visit: Payer: Self-pay

## 2021-10-30 ENCOUNTER — Encounter: Payer: Self-pay | Admitting: Surgery

## 2021-10-30 ENCOUNTER — Inpatient Hospital Stay: Payer: BC Managed Care – PPO | Admitting: Anesthesiology

## 2021-10-30 ENCOUNTER — Encounter
Admission: RE | Disposition: A | Discharge status: Home Health | Payer: Self-pay | Source: Home / Self Care | Attending: Surgery

## 2021-10-30 ENCOUNTER — Inpatient Hospital Stay
Admission: RE | Admit: 2021-10-30 | Discharge: 2021-11-04 | DRG: 827 | Disposition: A | Discharge status: Home Health | Payer: BC Managed Care – PPO | Attending: Surgery | Admitting: Surgery

## 2021-10-30 ENCOUNTER — Inpatient Hospital Stay: Payer: BC Managed Care – PPO

## 2021-10-30 DIAGNOSIS — I10 Essential (primary) hypertension: Secondary | ICD-10-CM | POA: Diagnosis present

## 2021-10-30 DIAGNOSIS — K383 Fistula of appendix: Secondary | ICD-10-CM | POA: Diagnosis present

## 2021-10-30 DIAGNOSIS — Z8546 Personal history of malignant neoplasm of prostate: Secondary | ICD-10-CM | POA: Diagnosis not present

## 2021-10-30 DIAGNOSIS — C7A8 Other malignant neuroendocrine tumors: Secondary | ICD-10-CM | POA: Diagnosis present

## 2021-10-30 DIAGNOSIS — Z6832 Body mass index (BMI) 32.0-32.9, adult: Secondary | ICD-10-CM | POA: Diagnosis not present

## 2021-10-30 DIAGNOSIS — N179 Acute kidney failure, unspecified: Secondary | ICD-10-CM | POA: Diagnosis not present

## 2021-10-30 DIAGNOSIS — C7B8 Other secondary neuroendocrine tumors: Secondary | ICD-10-CM | POA: Diagnosis present

## 2021-10-30 DIAGNOSIS — N3289 Other specified disorders of bladder: Secondary | ICD-10-CM | POA: Diagnosis present

## 2021-10-30 DIAGNOSIS — Z23 Encounter for immunization: Secondary | ICD-10-CM

## 2021-10-30 DIAGNOSIS — E669 Obesity, unspecified: Secondary | ICD-10-CM | POA: Diagnosis present

## 2021-10-30 DIAGNOSIS — K632 Fistula of intestine: Secondary | ICD-10-CM | POA: Diagnosis present

## 2021-10-30 DIAGNOSIS — Z905 Acquired absence of kidney: Secondary | ICD-10-CM | POA: Diagnosis not present

## 2021-10-30 DIAGNOSIS — N183 Chronic kidney disease, stage 3 unspecified: Secondary | ICD-10-CM | POA: Diagnosis present

## 2021-10-30 DIAGNOSIS — I129 Hypertensive chronic kidney disease with stage 1 through stage 4 chronic kidney disease, or unspecified chronic kidney disease: Secondary | ICD-10-CM | POA: Diagnosis present

## 2021-10-30 HISTORY — PX: PARTIAL COLECTOMY: SHX5273

## 2021-10-30 HISTORY — PX: COLOSTOMY REVISION: SHX5232

## 2021-10-30 HISTORY — PX: CYSTOSCOPY WITH STENT PLACEMENT: SHX5790

## 2021-10-30 LAB — COMPREHENSIVE METABOLIC PANEL
ALT: 26 U/L (ref 0–44)
AST: 28 U/L (ref 15–41)
Albumin: 4.7 g/dL (ref 3.5–5.0)
Alkaline Phosphatase: 58 U/L (ref 38–126)
Anion gap: 9 (ref 5–15)
BUN: 18 mg/dL (ref 6–20)
CO2: 25 mmol/L (ref 22–32)
Calcium: 9.6 mg/dL (ref 8.9–10.3)
Chloride: 106 mmol/L (ref 98–111)
Creatinine, Ser: 1.8 mg/dL — ABNORMAL HIGH (ref 0.61–1.24)
GFR, Estimated: 43 mL/min — ABNORMAL LOW (ref >60)
Glucose, Bld: 90 mg/dL (ref 70–99)
Potassium: 3.5 mmol/L (ref 3.5–5.1)
Sodium: 140 mmol/L (ref 135–145)
Total Bilirubin: 1.1 mg/dL (ref 0.3–1.2)
Total Protein: 8.9 g/dL — ABNORMAL HIGH (ref 6.5–8.1)

## 2021-10-30 LAB — PROTIME-INR
INR: 1.1 (ref 0.8–1.2)
Prothrombin Time: 14.3 seconds (ref 11.4–15.2)

## 2021-10-30 LAB — CBC WITH DIFFERENTIAL/PLATELET
Abs Immature Granulocytes: 0.01 10*3/uL (ref 0.00–0.07)
Basophils Absolute: 0 10*3/uL (ref 0.0–0.1)
Basophils Relative: 0 %
Eosinophils Absolute: 0.1 10*3/uL (ref 0.0–0.5)
Eosinophils Relative: 3 %
HCT: 45.7 % (ref 39.0–52.0)
Hemoglobin: 15.2 g/dL (ref 13.0–17.0)
Immature Granulocytes: 0 %
Lymphocytes Relative: 26 %
Lymphs Abs: 1.3 10*3/uL (ref 0.7–4.0)
MCH: 28 pg (ref 26.0–34.0)
MCHC: 33.3 g/dL (ref 30.0–36.0)
MCV: 84.2 fL (ref 80.0–100.0)
Monocytes Absolute: 0.4 10*3/uL (ref 0.1–1.0)
Monocytes Relative: 7 %
Neutro Abs: 3.1 10*3/uL (ref 1.7–7.7)
Neutrophils Relative %: 64 %
Platelets: 304 10*3/uL (ref 150–400)
RBC: 5.43 MIL/uL (ref 4.22–5.81)
RDW: 14.4 % (ref 11.5–15.5)
WBC: 4.9 10*3/uL (ref 4.0–10.5)
nRBC: 0 % (ref 0.0–0.2)

## 2021-10-30 SURGERY — COLECTOMY, RIGHT
Anesthesia: General | Site: Abdomen | Laterality: Right

## 2021-10-30 MED ORDER — ROCURONIUM BROMIDE 100 MG/10ML IV SOLN
INTRAVENOUS | Status: DC | PRN
  Administered 2021-10-30: 20 mg via INTRAVENOUS
  Administered 2021-10-30: 40 mg via INTRAVENOUS
  Administered 2021-10-30 (×2): 20 mg via INTRAVENOUS
  Administered 2021-10-30: 60 mg via INTRAVENOUS

## 2021-10-30 MED ORDER — ACETAMINOPHEN 10 MG/ML IV SOLN
1000.0000 mg | Freq: Four times a day (QID) | INTRAVENOUS | Status: AC
  Administered 2021-10-30 – 2021-10-31 (×4): 1000 mg via INTRAVENOUS
  Filled 2021-10-30 (×4): qty 100

## 2021-10-30 MED ORDER — GABAPENTIN 300 MG PO CAPS: 300.0000 mg | ORAL_CAPSULE | ORAL | Status: AC

## 2021-10-30 MED ORDER — ROCURONIUM BROMIDE 10 MG/ML (PF) SYRINGE
PREFILLED_SYRINGE | INTRAVENOUS | Status: AC
  Filled 2021-10-30: qty 10

## 2021-10-30 MED ORDER — SODIUM CHLORIDE (PF) 0.9 % IJ SOLN
INTRAMUSCULAR | Status: DC | PRN
  Administered 2021-10-30: 80 mL via INTRAMUSCULAR

## 2021-10-30 MED ORDER — FAMOTIDINE 20 MG PO TABS: 20.0000 mg | ORAL_TABLET | Freq: Once | ORAL | Status: AC

## 2021-10-30 MED ORDER — INFLUENZA VAC SPLIT QUAD 0.5 ML IM SUSY
0.5000 mL | PREFILLED_SYRINGE | INTRAMUSCULAR | Status: AC
  Administered 2021-11-04: 0.5 mL via INTRAMUSCULAR
  Filled 2021-10-30 (×2): qty 0.5

## 2021-10-30 MED ORDER — SODIUM CHLORIDE 0.9 % IV SOLN
2.0000 g | Freq: Three times a day (TID) | INTRAVENOUS | Status: AC
  Administered 2021-10-30 – 2021-10-31 (×3): 2 g via INTRAVENOUS
  Filled 2021-10-30 (×4): qty 2

## 2021-10-30 MED ORDER — CHLORHEXIDINE GLUCONATE 0.12 % MT SOLN
OROMUCOSAL | Status: AC
  Administered 2021-10-30: 15 mL via OROMUCOSAL
  Filled 2021-10-30: qty 15

## 2021-10-30 MED ORDER — LIDOCAINE HCL (CARDIAC) PF 100 MG/5ML IV SOSY
PREFILLED_SYRINGE | INTRAVENOUS | Status: DC | PRN
  Administered 2021-10-30: 100 mg via INTRAVENOUS

## 2021-10-30 MED ORDER — CHLORHEXIDINE GLUCONATE CLOTH 2 % EX PADS
6.0000 | MEDICATED_PAD | Freq: Once | CUTANEOUS | Status: AC
  Administered 2021-10-30: 6 via TOPICAL

## 2021-10-30 MED ORDER — KETAMINE HCL 50 MG/5ML IJ SOSY
PREFILLED_SYRINGE | INTRAMUSCULAR | Status: AC
  Filled 2021-10-30: qty 5

## 2021-10-30 MED ORDER — FENTANYL CITRATE (PF) 100 MCG/2ML IJ SOLN: 25.0000 ug | INTRAMUSCULAR | Status: DC | PRN

## 2021-10-30 MED ORDER — KETAMINE HCL 10 MG/ML IJ SOLN
INTRAMUSCULAR | Status: DC | PRN
  Administered 2021-10-30: 10 mg via INTRAVENOUS
  Administered 2021-10-30: 20 mg via INTRAVENOUS
  Administered 2021-10-30 (×2): 10 mg via INTRAVENOUS

## 2021-10-30 MED ORDER — LACTATED RINGERS IV SOLN: INTRAVENOUS | Status: DC

## 2021-10-30 MED ORDER — HYDROMORPHONE HCL 1 MG/ML IJ SOLN
INTRAMUSCULAR | Status: AC
  Filled 2021-10-30: qty 1

## 2021-10-30 MED ORDER — DEXMEDETOMIDINE HCL IN NACL 80 MCG/20ML IV SOLN
INTRAVENOUS | Status: AC
  Filled 2021-10-30: qty 20

## 2021-10-30 MED ORDER — CHLORHEXIDINE GLUCONATE 0.12 % MT SOLN: 15.0000 mL | Freq: Once | OROMUCOSAL | Status: AC

## 2021-10-30 MED ORDER — SODIUM CHLORIDE 0.9 % IR SOLN
Status: DC | PRN
  Administered 2021-10-30: 1000 mL via INTRAVESICAL

## 2021-10-30 MED ORDER — DEXAMETHASONE SODIUM PHOSPHATE 10 MG/ML IJ SOLN
INTRAMUSCULAR | Status: DC | PRN
  Administered 2021-10-30: 10 mg via INTRAVENOUS

## 2021-10-30 MED ORDER — FENTANYL CITRATE (PF) 100 MCG/2ML IJ SOLN
INTRAMUSCULAR | Status: AC
  Filled 2021-10-30: qty 2

## 2021-10-30 MED ORDER — GABAPENTIN 300 MG PO CAPS
ORAL_CAPSULE | ORAL | Status: AC
  Administered 2021-10-30: 300 mg via ORAL
  Filled 2021-10-30: qty 1

## 2021-10-30 MED ORDER — ACETAMINOPHEN 500 MG PO TABS
ORAL_TABLET | ORAL | Status: AC
  Administered 2021-10-30: 1000 mg via ORAL
  Filled 2021-10-30: qty 2

## 2021-10-30 MED ORDER — ACETAMINOPHEN 10 MG/ML IV SOLN
INTRAVENOUS | Status: AC
  Filled 2021-10-30: qty 100

## 2021-10-30 MED ORDER — ONDANSETRON 4 MG PO TBDP: 4.0000 mg | ORAL_TABLET | Freq: Four times a day (QID) | ORAL | Status: DC | PRN

## 2021-10-30 MED ORDER — PROPOFOL 10 MG/ML IV BOLUS
INTRAVENOUS | Status: AC
  Filled 2021-10-30: qty 20

## 2021-10-30 MED ORDER — ALVIMOPAN 12 MG PO CAPS
12.0000 mg | ORAL_CAPSULE | Freq: Two times a day (BID) | ORAL | Status: DC
  Administered 2021-10-31 – 2021-11-01 (×3): 12 mg via ORAL
  Filled 2021-10-30 (×5): qty 1

## 2021-10-30 MED ORDER — SODIUM CHLORIDE FLUSH 0.9 % IV SOLN
INTRAVENOUS | Status: AC
  Filled 2021-10-30: qty 30

## 2021-10-30 MED ORDER — POLYETHYLENE GLYCOL 3350 17 G PO PACK: 17.0000 g | PACK | Freq: Every day | ORAL | Status: DC | PRN

## 2021-10-30 MED ORDER — LACTATED RINGERS IV SOLN: INTRAVENOUS | Status: DC | PRN

## 2021-10-30 MED ORDER — SODIUM CHLORIDE 0.9 % IV SOLN
INTRAVENOUS | Status: AC
  Filled 2021-10-30: qty 2

## 2021-10-30 MED ORDER — FENTANYL CITRATE (PF) 100 MCG/2ML IJ SOLN
INTRAMUSCULAR | Status: DC | PRN
  Administered 2021-10-30 (×4): 50 ug via INTRAVENOUS

## 2021-10-30 MED ORDER — ACETAMINOPHEN 500 MG PO TABS: 1000.0000 mg | ORAL_TABLET | ORAL | Status: AC

## 2021-10-30 MED ORDER — MIDAZOLAM HCL 2 MG/2ML IJ SOLN
INTRAMUSCULAR | Status: DC | PRN
  Administered 2021-10-30: 2 mg via INTRAVENOUS

## 2021-10-30 MED ORDER — ONDANSETRON HCL 4 MG/2ML IJ SOLN: 4.0000 mg | Freq: Once | INTRAMUSCULAR | Status: DC | PRN

## 2021-10-30 MED ORDER — 0.9 % SODIUM CHLORIDE (POUR BTL) OPTIME
TOPICAL | Status: DC | PRN
  Administered 2021-10-30: 3000 mL

## 2021-10-30 MED ORDER — ALVIMOPAN 12 MG PO CAPS
ORAL_CAPSULE | ORAL | Status: AC
  Administered 2021-10-30: 12 mg via ORAL
  Filled 2021-10-30: qty 1

## 2021-10-30 MED ORDER — BUPIVACAINE LIPOSOME 1.3 % IJ SUSP: 20.0000 mL | Freq: Once | INTRAMUSCULAR | Status: DC

## 2021-10-30 MED ORDER — IRBESARTAN 150 MG PO TABS
75.0000 mg | ORAL_TABLET | Freq: Every day | ORAL | Status: DC
  Administered 2021-10-31 – 2021-11-04 (×5): 75 mg via ORAL
  Filled 2021-10-30 (×5): qty 1

## 2021-10-30 MED ORDER — ORAL CARE MOUTH RINSE: 15.0000 mL | Freq: Once | OROMUCOSAL | Status: AC

## 2021-10-30 MED ORDER — ACETAMINOPHEN 10 MG/ML IV SOLN
INTRAVENOUS | Status: DC | PRN
  Administered 2021-10-30: 1000 mg via INTRAVENOUS

## 2021-10-30 MED ORDER — VALSARTAN-HYDROCHLOROTHIAZIDE 80-12.5 MG PO TABS: 1.0000 | ORAL_TABLET | Freq: Every day | ORAL | Status: DC

## 2021-10-30 MED ORDER — BUPIVACAINE LIPOSOME 1.3 % IJ SUSP
INTRAMUSCULAR | Status: AC
  Filled 2021-10-30: qty 20

## 2021-10-30 MED ORDER — DEXMEDETOMIDINE HCL IN NACL 200 MCG/50ML IV SOLN
INTRAVENOUS | Status: DC | PRN
  Administered 2021-10-30 (×2): 8 ug via INTRAVENOUS
  Administered 2021-10-30: 4 ug via INTRAVENOUS
  Administered 2021-10-30: 8 ug via INTRAVENOUS

## 2021-10-30 MED ORDER — HEPARIN SODIUM (PORCINE) 5000 UNIT/ML IJ SOLN
5000.0000 [IU] | Freq: Three times a day (TID) | INTRAMUSCULAR | Status: DC
  Administered 2021-10-31 – 2021-11-04 (×13): 5000 [IU] via SUBCUTANEOUS
  Filled 2021-10-30 (×13): qty 1

## 2021-10-30 MED ORDER — HEMOSTATIC AGENTS (NO CHARGE) OPTIME
TOPICAL | Status: DC | PRN
  Administered 2021-10-30: 1 via TOPICAL

## 2021-10-30 MED ORDER — ONDANSETRON HCL 4 MG/2ML IJ SOLN
INTRAMUSCULAR | Status: AC
  Filled 2021-10-30: qty 2

## 2021-10-30 MED ORDER — DEXAMETHASONE SODIUM PHOSPHATE 10 MG/ML IJ SOLN
INTRAMUSCULAR | Status: AC
  Filled 2021-10-30: qty 1

## 2021-10-30 MED ORDER — SODIUM CHLORIDE 0.9 % IV SOLN
2.0000 g | INTRAVENOUS | Status: AC
  Administered 2021-10-30 (×2): 2 g via INTRAVENOUS

## 2021-10-30 MED ORDER — ONDANSETRON HCL 4 MG/2ML IJ SOLN: 4.0000 mg | Freq: Four times a day (QID) | INTRAMUSCULAR | Status: DC | PRN

## 2021-10-30 MED ORDER — PHENYLEPHRINE HCL-NACL 20-0.9 MG/250ML-% IV SOLN
INTRAVENOUS | Status: DC | PRN
  Administered 2021-10-30: 20 ug/min via INTRAVENOUS

## 2021-10-30 MED ORDER — ONDANSETRON HCL 4 MG/2ML IJ SOLN
INTRAMUSCULAR | Status: DC | PRN
  Administered 2021-10-30: 4 mg via INTRAVENOUS

## 2021-10-30 MED ORDER — HYDROCHLOROTHIAZIDE 12.5 MG PO TABS
12.5000 mg | ORAL_TABLET | Freq: Every day | ORAL | Status: DC
  Administered 2021-10-31 – 2021-11-04 (×5): 12.5 mg via ORAL
  Filled 2021-10-30 (×5): qty 1

## 2021-10-30 MED ORDER — LIDOCAINE HCL (PF) 2 % IJ SOLN
INTRAMUSCULAR | Status: AC
  Filled 2021-10-30: qty 5

## 2021-10-30 MED ORDER — MIDAZOLAM HCL 2 MG/2ML IJ SOLN
INTRAMUSCULAR | Status: AC
  Filled 2021-10-30: qty 2

## 2021-10-30 MED ORDER — PANTOPRAZOLE SODIUM 40 MG IV SOLR
40.0000 mg | Freq: Every day | INTRAVENOUS | Status: DC
  Administered 2021-10-30 – 2021-11-02 (×4): 40 mg via INTRAVENOUS
  Filled 2021-10-30 (×4): qty 10

## 2021-10-30 MED ORDER — PROPOFOL 10 MG/ML IV BOLUS
INTRAVENOUS | Status: DC | PRN
  Administered 2021-10-30: 150 mg via INTRAVENOUS
  Administered 2021-10-30: 30 mg via INTRAVENOUS

## 2021-10-30 MED ORDER — LORATADINE 10 MG PO TABS
10.0000 mg | ORAL_TABLET | Freq: Every day | ORAL | Status: DC
  Filled 2021-10-30 (×5): qty 1

## 2021-10-30 MED ORDER — IOHEXOL 180 MG/ML  SOLN
INTRAMUSCULAR | Status: DC | PRN
  Administered 2021-10-30: 3 mL

## 2021-10-30 MED ORDER — FAMOTIDINE 20 MG PO TABS
ORAL_TABLET | ORAL | Status: AC
  Administered 2021-10-30: 20 mg via ORAL
  Filled 2021-10-30: qty 1

## 2021-10-30 MED ORDER — SUGAMMADEX SODIUM 200 MG/2ML IV SOLN
INTRAVENOUS | Status: DC | PRN
  Administered 2021-10-30: 200 mg via INTRAVENOUS

## 2021-10-30 MED ORDER — PHENYLEPHRINE HCL-NACL 20-0.9 MG/250ML-% IV SOLN
INTRAVENOUS | Status: AC
  Filled 2021-10-30: qty 250

## 2021-10-30 MED ORDER — ALVIMOPAN 12 MG PO CAPS: 12.0000 mg | ORAL_CAPSULE | ORAL | Status: AC

## 2021-10-30 MED ORDER — HYDROMORPHONE HCL 1 MG/ML IJ SOLN
0.5000 mg | INTRAMUSCULAR | Status: DC | PRN
  Administered 2021-10-30 – 2021-11-03 (×11): 0.5 mg via INTRAVENOUS
  Filled 2021-10-30 (×11): qty 0.5

## 2021-10-30 MED ORDER — SEVOFLURANE IN SOLN
RESPIRATORY_TRACT | Status: AC
  Filled 2021-10-30: qty 250

## 2021-10-30 MED ORDER — BUPIVACAINE-EPINEPHRINE (PF) 0.25% -1:200000 IJ SOLN
INTRAMUSCULAR | Status: AC
  Filled 2021-10-30: qty 30

## 2021-10-30 SURGICAL SUPPLY — 96 items
ADAPTER GOLDBERG URETERAL (ADAPTER) IMPLANT
BAG BILE T-TUBES STRL (MISCELLANEOUS) IMPLANT
BAG DRAIN SIEMENS DORNER NS (MISCELLANEOUS) ×2 IMPLANT
BAG URINE DRAIN 2000ML AR STRL (UROLOGICAL SUPPLIES) ×2 IMPLANT
BAG URO DRAIN 4000ML (MISCELLANEOUS) ×2 IMPLANT
BRUSH SCRUB EZ 1% IODOPHOR (MISCELLANEOUS) ×2 IMPLANT
BULB RESERV EVAC DRAIN JP 100C (MISCELLANEOUS) IMPLANT
CATH FOLEY 2WAY  5CC 16FR (CATHETERS)
CATH FOLEY SIL 2WAY 14FR5CC (CATHETERS) IMPLANT
CATH ROBINSON RED A/P 16FR (CATHETERS) IMPLANT
CATH ROBINSON RED A/P 18FR (CATHETERS) IMPLANT
CATH URETL OPEN 5X70 (CATHETERS) ×2 IMPLANT
CATH URTH 16FR FL 2W BLN LF (CATHETERS) IMPLANT
CHLORAPREP W/TINT 26 (MISCELLANEOUS) ×2 IMPLANT
DRAIN CHANNEL JP 19F (MISCELLANEOUS) IMPLANT
DRAIN PENROSE 12X.25 LTX STRL (MISCELLANEOUS) IMPLANT
DRAPE LAPAROTOMY 100X77 ABD (DRAPES) ×2 IMPLANT
DRAPE LEGGINS SURG 28X43 STRL (DRAPES) IMPLANT
DRAPE UNDER BUTTOCK W/FLU (DRAPES) ×2 IMPLANT
DRSG OPSITE POSTOP 4X12 (GAUZE/BANDAGES/DRESSINGS) IMPLANT
DRSG TEGADERM 4X4.75 (GAUZE/BANDAGES/DRESSINGS) IMPLANT
ELECT CAUTERY BLADE TIP 2.5 (TIP) ×2
ELECT EZSTD 165MM 6.5IN (MISCELLANEOUS) ×2
ELECT REM PT RETURN 9FT ADLT (ELECTROSURGICAL) ×2
ELECTRODE CAUTERY BLDE TIP 2.5 (TIP) ×2 IMPLANT
ELECTRODE EZSTD 165MM 6.5IN (MISCELLANEOUS) IMPLANT
ELECTRODE REM PT RTRN 9FT ADLT (ELECTROSURGICAL) ×2 IMPLANT
GAUZE 4X4 16PLY ~~LOC~~+RFID DBL (SPONGE) ×6 IMPLANT
GAUZE SPONGE 4X4 12PLY STRL (GAUZE/BANDAGES/DRESSINGS) ×2 IMPLANT
GLOVE BIO SURGEON STRL SZ 6.5 (GLOVE) ×2 IMPLANT
GLOVE SURG SYN 7.0 (GLOVE) ×14 IMPLANT
GLOVE SURG SYN 7.0 PF PI (GLOVE) ×4 IMPLANT
GLOVE SURG SYN 7.5  E (GLOVE) ×4
GLOVE SURG SYN 7.5 E (GLOVE) ×4 IMPLANT
GLOVE SURG SYN 7.5 PF PI (GLOVE) ×4 IMPLANT
GOWN STRL REUS W/ TWL LRG LVL3 (GOWN DISPOSABLE) ×10 IMPLANT
GOWN STRL REUS W/TWL LRG LVL3 (GOWN DISPOSABLE) ×14
GUIDEWIRE STR DUAL SENSOR (WIRE) ×2 IMPLANT
HANDLE SUCTION POOLE (INSTRUMENTS) IMPLANT
IV NS IRRIG 3000ML ARTHROMATIC (IV SOLUTION) ×2 IMPLANT
KIT IMAGING PINPOINTPAQ (MISCELLANEOUS) IMPLANT
KIT OSTOMY 2 PC DRNBL 2.25 STR (WOUND CARE) IMPLANT
KIT OSTOMY DRAINABLE 2.25 STR (WOUND CARE) ×2
KIT TURNOVER CYSTO (KITS) ×2 IMPLANT
KIT TURNOVER KIT A (KITS) ×2 IMPLANT
LABEL OR SOLS (LABEL) ×2 IMPLANT
LIGASURE IMPACT 36 18CM CVD LR (INSTRUMENTS) IMPLANT
MANIFOLD NEPTUNE II (INSTRUMENTS) ×4 IMPLANT
NEEDLE HYPO 22GX1.5 SAFETY (NEEDLE) ×2 IMPLANT
NS IRRIG 1000ML POUR BTL (IV SOLUTION) ×2 IMPLANT
PACK BASIN MAJOR ARMC (MISCELLANEOUS) ×2 IMPLANT
PACK COLON CLEAN CLOSURE (MISCELLANEOUS) ×2 IMPLANT
PACK CYSTO AR (MISCELLANEOUS) ×2 IMPLANT
RELOAD PROXIMATE 75MM BLUE (ENDOMECHANICALS) ×8 IMPLANT
RELOAD STAPLE 75 3.8 BLU REG (ENDOMECHANICALS) IMPLANT
SEPRAFILM MEMBRANE 5X6 (MISCELLANEOUS) IMPLANT
SET CYSTO W/LG BORE CLAMP LF (SET/KITS/TRAYS/PACK) ×2 IMPLANT
SPONGE T-LAP 18X18 ~~LOC~~+RFID (SPONGE) ×8 IMPLANT
STAPLER CIRCULAR MANUAL XL 29 (STAPLE) IMPLANT
STAPLER CIRCULAR MANUAL XL 33 (STAPLE) IMPLANT
STAPLER CVD CUT BL 40 RELOAD (ENDOMECHANICALS) IMPLANT
STAPLER CVD CUT BLU 40 RELOAD (ENDOMECHANICALS) IMPLANT
STAPLER CVD CUT GN 40 RELOAD (ENDOMECHANICALS) ×2 IMPLANT
STAPLER CVD CUT GRN 40 RELOAD (ENDOMECHANICALS) IMPLANT
STAPLER PROXIMATE 75MM BLUE (STAPLE) IMPLANT
STAPLER RELOADABLE 65 2-0 SUT (MISCELLANEOUS) IMPLANT
STAPLER SKIN PROX 35W (STAPLE) ×2 IMPLANT
STAPLER SYS INTERNAL RELOAD SS (MISCELLANEOUS) IMPLANT
STENT URET 6FRX24 CONTOUR (STENTS) IMPLANT
STENT URET 6FRX26 CONTOUR (STENTS) IMPLANT
SUCTION POOLE HANDLE (INSTRUMENTS) ×4
SURGILUBE 2OZ TUBE FLIPTOP (MISCELLANEOUS) ×2 IMPLANT
SUT ETHILON 3-0 FS-10 30 BLK (SUTURE) ×2
SUT MNCRL 3-0 UNDYED SH (SUTURE) ×2 IMPLANT
SUT MONOCRYL 3-0 UNDYED (SUTURE) ×2
SUT PDS AB 1 CT1 27 (SUTURE) ×4 IMPLANT
SUT PROLENE 2 0 SH DA (SUTURE) IMPLANT
SUT SILK 0 (SUTURE)
SUT SILK 0 30XBRD TIE 6 (SUTURE) IMPLANT
SUT SILK 0 SH 30 (SUTURE) IMPLANT
SUT SILK 2-0 (SUTURE) ×2 IMPLANT
SUT SILK 3-0 (SUTURE) ×2 IMPLANT
SUT VIC AB 1 CTX 27 (SUTURE) ×2 IMPLANT
SUT VIC AB 2-0 SH 27 (SUTURE) ×2
SUT VIC AB 2-0 SH 27XBRD (SUTURE) IMPLANT
SUT VIC AB 3-0 SH 8-18 (SUTURE) IMPLANT
SUTURE EHLN 3-0 FS-10 30 BLK (SUTURE) IMPLANT
SYR 10ML LL (SYRINGE) ×2 IMPLANT
SYR 20ML LL LF (SYRINGE) IMPLANT
SYR TOOMEY IRRIG 70ML (MISCELLANEOUS) ×4
SYRINGE TOOMEY IRRIG 70ML (MISCELLANEOUS) ×2 IMPLANT
TRAP FLUID SMOKE EVACUATOR (MISCELLANEOUS) ×4 IMPLANT
TRAY FOLEY MTR SLVR 16FR STAT (SET/KITS/TRAYS/PACK) ×2 IMPLANT
TUBING ART PRESS 48 MALE/FEM (TUBING) IMPLANT
WATER STERILE IRR 1000ML POUR (IV SOLUTION) ×2 IMPLANT
WATER STERILE IRR 500ML POUR (IV SOLUTION) ×4 IMPLANT

## 2021-10-30 NOTE — Transfer of Care (Signed)
Immediate Anesthesia Transfer of Care Note  Patient: HEZAKIAH CHAMPEAU  Procedure(s) Performed: COLON RESECTION RIGHT;POSSIBLE DIVERTING ILEOSTOMY, Edison Simon PAC to assist (Abdomen) PARTIAL COLECTOMY; SIGMOID COLECTOMY (Abdomen) CYSTOSCOPY WITH URETRAL  STENT PLACEMENT (Right)  Patient Location: PACU  Anesthesia Type:General  Level of Consciousness: drowsy  Airway & Oxygen Therapy: Patient Spontanous Breathing and Patient connected to face mask oxygen  Post-op Assessment: Report given to RN and Post -op Vital signs reviewed and stable  Post vital signs: Reviewed and stable  Last Vitals:  Vitals Value Taken Time  BP 127/80 10/30/21 1500  Temp    Pulse 67 10/30/21 1503  Resp 16 10/30/21 1503  SpO2 100 % 10/30/21 1503  Vitals shown include unvalidated device data.  Last Pain:  Vitals:   10/30/21 0631  TempSrc: Temporal  PainSc: 0-No pain         Complications: No notable events documented.

## 2021-10-30 NOTE — Op Note (Signed)
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  Mild bladder trabeculation.  EBL: Minimal  Specimens: None  Drains: 6 x 26 French double-J ureteral stent on right  Indication: Patrick Pittman is a 61 y.o. patient with solitary right kidney as well as neuroendocrine tumor of the appendix who is returning for colectomy.  After reviewing the management options for treatment, he elected to proceed with the above surgical procedure(s). We have discussed the potential benefits and risks of the procedure, side effects of the proposed treatment, the likelihood of the patient achieving the goals of the procedure, and any potential problems that might occur during the procedure or recuperation. Informed consent has been obtained.  Description of procedure:  The patient was taken to the operating room and general anesthesia was induced.  The patient was placed in the dorsal lithotomy position, prepped and draped in the usual sterile fashion, and preoperative antibiotics were administered. A preoperative time-out was performed.   21 French cystoscope was advanced per urethra into the bladder.  Notably, the patient had a significant intravesical median lobe component with friable prostatic vessels and mild trabeculation.  I did have some difficulty identifying the right UO but ultimately was successful.  I used an open-ended ureteral catheter and a wire to cannulate the orifice carefully as there was some J hooking.  The wire was then advanced to the level of the kidney and the open-ended ureteral catheter to the mid ureter.  The wire was withdrawn  and a gentle retrograde on the side showed a normal collecting system without hydroureteronephrosis.  The wire was then replaced and the open-ended ureteral catheter was removed.  A 6 x 26 French double-J ureteral stent was advanced over the wire up to the level of the kidney.  Once the stent was deployed by removing the wire, a hook of the coil was noted over an upper pole calyx and a full coil within the bladder.  The bladder was then drained.  A 16 French Foley catheter was placed sterilely and the balloon filled with 10 cc of sterile water.  The patient was then cleaned and dried, repositioned in supine position, reversed from anesthesia, and taken to the PACU in stable condition.  Plan: We will have him return next week to the office for cystoscopy, stent removal  Hollice Espy, M.D.

## 2021-10-30 NOTE — Brief Op Note (Signed)
10/30/2021  3:02 PM  PATIENT:  Patrick Pittman  61 y.o. male  PRE-OPERATIVE DIAGNOSIS:  neuroendocrine tumor of appendix  POST-OPERATIVE DIAGNOSIS:  neuroendocrine tumor of appendix  PROCEDURE:  Procedure(s): COLON RESECTION RIGHT;POSSIBLE DIVERTING ILEOSTOMY, Edison Simon PAC to assist (N/A) PARTIAL COLECTOMY; SIGMOID COLECTOMY (N/A) CYSTOSCOPY WITH URETRAL  STENT PLACEMENT (Right)  SURGEON:  Surgeon(s) and Role: Panel 1:    * Olean Ree, MD - Primary Panel 2:    * Hollice Espy, MD - Primary  PHYSICIAN ASSISTANT:  Edison Simon, PA-C  ANESTHESIA:   general  EBL:  200 mL   BLOOD ADMINISTERED:none  DRAINS: Penrose drain in the midline incision, Nasogastric Tube, Urinary Catheter (Foley), and (19 Fr.) Blake drain(s) in the pelvis and right gutter    LOCAL MEDICATIONS USED:  BUPIVICAINE   SPECIMEN:   Right colon Sigmoid colon  DISPOSITION OF SPECIMEN:  PATHOLOGY  COUNTS:  YES  TOURNIQUET:  * No tourniquets in log *  DICTATION: .Dragon Dictation  PLAN OF CARE: Admit to inpatient   PATIENT DISPOSITION:  PACU - hemodynamically stable.   Delay start of Pharmacological VTE agent (>24hrs) due to surgical blood loss or risk of bleeding: yes

## 2021-10-30 NOTE — Anesthesia Procedure Notes (Signed)
Procedure Name: Intubation Date/Time: 10/30/2021 7:45 AM  Performed by: Tollie Eth, CRNAPre-anesthesia Checklist: Patient identified, Patient being monitored, Timeout performed, Emergency Drugs available and Suction available Patient Re-evaluated:Patient Re-evaluated prior to induction Oxygen Delivery Method: Circle system utilized Preoxygenation: Pre-oxygenation with 100% oxygen Induction Type: IV induction Ventilation: Mask ventilation without difficulty Laryngoscope Size: McGraph and 4 Grade View: Grade I Tube type: Oral Tube size: 7.0 mm Number of attempts: 1 Airway Equipment and Method: Stylet and Video-laryngoscopy Placement Confirmation: ETT inserted through vocal cords under direct vision, positive ETCO2 and breath sounds checked- equal and bilateral Secured at: 21 cm Tube secured with: Tape Dental Injury: Teeth and Oropharynx as per pre-operative assessment

## 2021-10-30 NOTE — Consult Note (Signed)
[facility] Nurse ostomy consult note Consult received for new diverting loop ileostomy created today by Dr. Hampton Abbot.   [facility] nurse will see either tomorrow or Thursday for stoma assessment, pouch change and initiation of education.  [facility] nursing team will follow, and will remain available to this patient, the nursing and medical teams.    Thank you for inviting [place] to participate in this patient's Plan of Care.  Maudie Flakes, MSN, RN, CNS, [place], Serita Grammes, [employer], [employer] phone:  [phone]

## 2021-10-30 NOTE — Anesthesia Preprocedure Evaluation (Addendum)
Anesthesia Evaluation  Patient identified by MRN, date of birth, ID band Patient awake    Reviewed: Allergy & Precautions, NPO status , Patient's Chart, lab work & pertinent test results  Airway Mallampati: III  TM Distance: >3 FB Neck ROM: full    Dental  (+) Teeth Intact   Pulmonary neg pulmonary ROS,    Pulmonary exam normal breath sounds clear to auscultation       Cardiovascular Exercise Tolerance: Good hypertension, Pt. on medications negative cardio ROS Normal cardiovascular exam Rhythm:Regular     Neuro/Psych negative neurological ROS  negative psych ROS   GI/Hepatic negative GI ROS, Neg liver ROS, hiatal hernia, (+) Cirrhosis:        ,   Endo/Other  negative endocrine ROS  Renal/GU Renal disease  negative genitourinary   Musculoskeletal  (+) Arthritis ,   Abdominal (+) + obese,   Peds negative pediatric ROS (+)  Hematology negative hematology ROS (+)   Anesthesia Other Findings Past Medical History: No date: Cancer ([place])     Comment:  prostate No date: History of hiatal hernia No date: Hypertension No date: No blood products     Comment:  Jehovah's witness  Past Surgical History: No date: COLONOSCOPY 05/23/2021: COLONOSCOPY WITH PROPOFOL; N/A     Comment:  Procedure: COLONOSCOPY WITH PROPOFOL;  Surgeon: [name], MD;  Location: [facility] ENDOSCOPY;  Service:               Gastroenterology;  Laterality: N/A; 07/23/2021: LAPAROSCOPIC APPENDECTOMY; N/A     Comment:  Procedure: APPENDECTOMY LAPAROSCOPIC HAND ASSISTED;                Surgeon: Olean Ree, MD;  Location: [facility] ORS;                Service: General;  Laterality: N/A; 07/23/2021: LAPAROSCOPIC NEPHRECTOMY, HAND ASSISTED; Left     Comment:  Procedure: HAND ASSISTED LAPAROSCOPIC RADICAL               NEPHRECTOMY;  Surgeon: Hollice Espy, MD;  Location:               [facility] ORS;  Service: Urology;  Laterality:  Left; 07/26/2015: RESECTION DISTAL CLAVICAL; Right     Comment:  Procedure: Open excision of right distal clavicle.;                Surgeon: Corky Mull, MD;  Location: [facility];  Service: Orthopedics;  Laterality: Right; 07/23/2021: TAKE DOWN OF INTESTINAL FISTULA     Comment:  Procedure: TAKE DOWN OF INTESTINAL FISTULA;  Surgeon:               Olean Ree, MD;  Location: [facility] ORS;  Service:               General;;  BMI    Body Mass Index: 32.14 kg/m      Reproductive/Obstetrics negative OB ROS                            Anesthesia Physical Anesthesia Plan  ASA: 3  Anesthesia Plan: General   Post-op Pain Management:    Induction: Intravenous  PONV Risk Score and Plan: Ondansetron, Dexamethasone, Midazolam and Treatment may vary due to age or medical condition  Airway Management Planned:  Oral ETT  Additional Equipment:   Intra-op Plan:   Post-operative Plan: Extubation in OR  Informed Consent: I have reviewed the patients History and Physical, chart, labs and discussed the procedure including the risks, benefits and alternatives for the proposed anesthesia with the patient or authorized representative who has indicated his/her understanding and acceptance.     Dental Advisory Given  Plan Discussed with: CRNA and Surgeon  Anesthesia Plan Comments:         Anesthesia Quick Evaluation

## 2021-10-30 NOTE — Interval H&P Note (Signed)
History and Physical Interval Note:  10/30/2021 7:27 AM  Patrick Pittman  has presented today for surgery, with the diagnosis of neuroendocrine tumor of appendix.  The various methods of treatment have been discussed with the patient and family. After consideration of risks, benefits and other options for treatment, the patient has consented to  Procedure(s): COLON RESECTION RIGHT;POSSIBLE DIVERTING ILEOSTOMY, Edison Simon PAC to assist (N/A) PARTIAL COLECTOMY; SIGMOID COLECTOMY (N/A) CYSTOSCOPY WITH URETRAL  STENT PLACEMENT (Right) as a surgical intervention.  The patient's history has been reviewed, patient examined, no change in status, stable for surgery.  I have reviewed the patient's chart and labs.  Questions were answered to the patient's satisfaction.    RRR CTAB  Discussed risks, benefits of ureteral stent.     Hollice Espy

## 2021-10-30 NOTE — Op Note (Signed)
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 male who presents with a history of an appendiceal mass s/p laparoscopic hand-assisted appendectomy.  This resulted in a neuroendocrine tumor, and given size, a right colectomy was recommended.  He also had a fistula from appendix to the sigmoid colon, and margin there was also positive for neuroendocrine tumor, and a sigmoidectomy was recommended for clearance of margins.  The risks of bleeding, abscess or infection, injury to surrounding structures, the possibility of a diverting loop ileostomy and need for further procedures were all discussed with the patient and was willing to proceed.  Description of Procedure: The patient was correctly identified in the preoperative area and brought into the operating room.  The patient was placed supine with VTE prophylaxis in place.  Appropriate time-outs were performed.  Anesthesia was induced and the patient was intubated.  Foley catheter was placed.  Appropriate antibiotics were infused.  The patient had been previously marked for possible loop ileostomy by our [place] RN.  The patient first underwent a cystoscopy with a right ureteral stent placement by Dr. Erlene Quan.  Please see her operative note for further details.  After Dr. Erlene Quan had finished her procedure, the abdomen was prepped and draped in  a sterile fashion.  A midline incision was made and electrocautery was used to dissect down the subcutaneous tissue to the fascia.  The fascia was incised and extended superiorly and inferiorly.  We started with the right colectomy.  The terminal ileum was very scarred down to the pelvic side wall as had been seen during his first surgery.  The right colon was mobilized by incising along the [employer] [place].  The right ureter and gonadal vessels were encountered, identified, and kept within the retroperitoneum.  The stent was palpated helping [place] identify the ureter.  This mobilization allowed for mobilization of the terminal ileum.  Combination of both blunt and cautery dissection was used to dissect the terminal ileum, with careful attention to not injure the ureter or gonadal vessels.  Then we proceeded distally and dissected the hepatic flexure off the attachments to the liver, and then distally by entering the lesser sac and freeing the transverse colon off the omentum and duodenum.  No injury to duodenum or other structures were noted.  Once the terminal ileum and colon were mobilized all the way to the mid transverse colon, windows were created both proximally and distally in the mesentery at our points of transection.  GIA blue load 75 mm stapler was used to transect at the terminal ileum and proximal transverse colon.  Then, LigaSure was used to take down the mesentery, with careful attention at the ileocolic, right colic and middle colic bundles.  The antimesenteric edges of the terminal ileum and transverse colon were lined up using two 3-0 silk sutures and enterotomies were created at the staple lines.  Another blue load was used to creat our common channel of the ileocolic anastomosis.  Another blue load  was used to close the common channel, and 3-0 silk sutures were used to imbricate the staple line in L[place]t fashion.  The mesenteric defect was closed also using a 3-0 silk suture.    Then,  attention was turned to the sigmoid colon.  There was an area of the mid sigmoid colon which was very scarred to the right pelvic sidewall, at the side of the prior appendectomy.  This portion of sigmoid colon was redundant.  Combination of blunt and cautery dissection was done to mobilize the sigmoid colon along the white line of toldt in lateral to medial fashion.  Once this was done, a window was created in the distal descending colon mesentery, and another blue load was used to transect/staple across the descending/sigmoid margin.  LigaSure was used to take down the mesentery of the sigmoid colon distally going to the rectum, and also to mobilize distally in the lateral attachments to the peritoneum.  Once dissection was completed to the rectosigmoid junction, a green load contour stapler was used to transect distally.  The specimen was removed and evaluated.  A staple line from the prior surgery was not identified.  However, the remaining descending colon and rectum were in pristine condition without any scarring changes to suggest a staple line, and they were further away from where the appendix used to be.  Two silk sutures that were used to help imbricate the staple line were identified.  The specimen was opened and a possible area of mucosal changes/ulceration was identified.  This was marked with a 3-0 silk suture for pathology and the specimen was sent off.  The descending colon staple line was cut off using cautery and dilators were used to determine the size of the colon, and it was determined that a 33 mm EEA stapler would be used.  A purse string suture was created using 2-0 Prolene and the anvil was secured in place.  Then, dilators were used in the rectal side and a stapler was introduced and spike deployed through the rectal staple line.  The anvil and spike were secured together and stapler fired.  The anastomotic donuts were intact, but when testing the anastomosis with insufflation using a red  rubber catheter, air bubbles were seen anteriorly.  This leak was reinforced using multiple 3-0 silk sutures.  Further leak test was negative.   The abdomen was thoroughly irrigated.  Hemostasis was good using cautery.  3 gm Arista powder was sprayed over the raw surfaces for further hemostasis.  A 19 Fr. Blake drain was placed through the left lower quadrant to the pelvis and right gutter.  The marked site for the ileostomy was incised and the subcutaneous tissue was resected down to the anterior sheath.  Cruciate incision was made, rectus muscles separated/split, and another cruciate incision was made at the posterior sheath.  This was wide enough to fit our planned loop ileostomy loop of bowel.    We then proceeded with our clean closure by redraping and getting new gowns and gloves.  80 ml of Exparel solution mixed with 0.5% bupivacaine with epi was infiltrated over the peritoneum, fascia, and subcutaneous tissue.  The fascia was then closed using #1 PDS sutures.  The midline wound was irrigated and closed using 3-0 Vicryl and staples over a 1/4 inch penrose drain.  The Blake drain was secured using 3-0 nylon suture.  Towels were applied to protect the incision and around the ostomy site.  Then, the loop ileostomy was created and matured  in brooke fashion, using also a 16 Fr. Red ruber catheter as an ostomy bridge.  Once completed, the wounds were cleaned and the midline wound was dressed with Honeycomb dressing and the drain with 4x4 gauze and tegaderm.  Ostomy appliance was cut to shape and placed over the loop ileostomy.  The patient was emerged from anesthesia and extubated and brought to the recovery room for further management.  The patient tolerated the procedure well and all counts were correct at the end of the case.  Please note that Mr. Olean Ree was scrubbed in for the entirety of the surgery, and his assistance was critical for all steps and he was involved in the entire case, from initial  laparotomy incision, exposure, mobilization of colon, anastomosis creation, abdominal closure, and loop ileostomy creation.   [name], MD

## 2021-10-30 NOTE — H&P (Signed)
10/30/21  History of Present Illness: Patrick Pittman is a 61 y.o. male presents for follow-up and H&P update in preparation for surgery on 10/09/2021.  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  CT scan also found a left renal mass and the patient underwent a laparoscopic hand-assisted left nephrectomy and appendectomy with Dr. Erlene Quan and myself on 07/23/2021.  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  The proximal staple line going to the cecum was negative for cancer but the distal staple line at the coloappendiceal fistula was positive for tumor cells.  As such, the plan was for an open right colectomy for the neuroendocrine tumor itself as well as a sigmoidectomy to get clear margins given the positive cells at the staple line.  The patient was scheduled for surgery 10/09/2021 but the case was postponed after the patient had not taken the appropriate bowel prep medications.       Past Medical History:     Past Medical History:  Diagnosis Date   Cancer ([place])     History of hiatal hernia     Hypertension     No blood products      Jehovah's witness      Past Surgical History:      Past Surgical History:  Procedure Laterality Date   COLONOSCOPY       COLONOSCOPY WITH PROPOFOL N/A 05/23/2021    Procedure: COLONOSCOPY WITH PROPOFOL;  Surgeon: [name], MD;  Location: [facility] ENDOSCOPY;  Service: Gastroenterology;  Laterality: N/A;   LAPAROSCOPIC APPENDECTOMY N/A 07/23/2021    Procedure: APPENDECTOMY LAPAROSCOPIC HAND ASSISTED;  Surgeon: Olean Ree, MD;  Location: [facility] ORS;  Service: General;  Laterality: N/A;   LAPAROSCOPIC NEPHRECTOMY, HAND ASSISTED Left 07/23/2021    Procedure: HAND ASSISTED LAPAROSCOPIC RADICAL NEPHRECTOMY;  Surgeon: Hollice Espy, MD;  Location: [facility]  ORS;  Service: Urology;  Laterality: Left;   RESECTION DISTAL CLAVICAL Right 07/26/2015    Procedure: Open excision of right distal clavicle.;  Surgeon: Corky Mull, MD;  Location: [place];  Service: Orthopedics;  Laterality: Right;   TAKE DOWN OF INTESTINAL FISTULA   07/23/2021    Procedure: TAKE DOWN OF INTESTINAL FISTULA;  Surgeon: Olean Ree, MD;  Location: [facility] ORS;  Service: General;;      Home Medications:        Prior to Admission medications   Medication Sig Start Date End Date Taking? Authorizing Provider  loratadine (CLARITIN) 10 MG tablet Take 10 mg by mouth daily. 05/24/21   Yes [provider]  sildenafil (REVATIO) 20 MG tablet Take 20 mg by mouth. Take 2-5 tablets by mouth as needed     Yes [provider]  valsartan-hydrochlorothiazide (DIOVAN-HCT) 80-12.5 MG tablet Take 1 tablet by mouth daily. 02/08/21   Yes [provider]      Allergies: No Known Allergies   Review of Systems: Review of Systems  Constitutional:  Negative for chills and fever.  HENT:  Negative for hearing loss.   Respiratory:  Negative for shortness of breath.   Cardiovascular:  Negative for chest pain.  Gastrointestinal:  Negative for abdominal pain, nausea and vomiting.  Genitourinary:  Negative for dysuria.  Musculoskeletal:  Negative for myalgias.  Skin:  Negative for rash.  Neurological:  Negative for dizziness.  Psychiatric/Behavioral:  Negative for  depression.       Physical Exam BP (!) 143/90   Pulse 73   Temp 98.3 F (36.8 C) (Oral)   Wt 219 lb 6.4 oz (99.5 kg)   SpO2 97%   BMI 31.04 kg/m  CONSTITUTIONAL: No acute distress, well-nourished HEENT:  Normocephalic, atraumatic, extraocular motion intact. NECK: Trachea is midline, no jugular venous distention. RESPIRATORY:  Lungs are clear, and breath sounds are equal bilaterally. Normal respiratory effort without pathologic use of accessory muscles. CARDIOVASCULAR: Heart is regular without  murmurs, gallops, or rubs. GI: The abdomen is soft, nondistended, nontender to palpation.  He has very well-healed scars from his surgery. MUSCULOSKELETAL: Normal gait, no peripheral edema. NEUROLOGIC:  Motor and sensation is grossly normal.  Cranial nerves are grossly intact. PSYCH:  Alert and oriented to person, place and time. Affect is normal.   Labs/Imaging: Labs from 08/02/2021: Sodium 136, potassium 4.6, chloride 105, CO2 25, BUN 29, creatinine 1.97.  Total bilirubin 0.8, AST 44, ALT 76, alkaline phosphatase 59, albumin 3.6.  WBC 7.4, hemoglobin 14.6, hematocrit 43.8, platelets 296.   Assessment and Plan: This is a 61 y.o. 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   --Patient presents now for surgery.  He did the bowel prep appropriately and is ready for surgery today.  Case discussed with him again and plan for right colectomy and sigmoidectomy, with possible loop ileostomy if any issues with either anastomosis.  Dr. Erlene Quan with Urology will assist this morning with ureteral stent on the right side.  Patient is s/p left nephrectomy. --All questions answered.    [name], [facility]

## 2021-10-31 ENCOUNTER — Encounter: Payer: Self-pay | Admitting: Surgery

## 2021-10-31 LAB — BASIC METABOLIC PANEL
Anion gap: 9 (ref 5–15)
BUN: 20 mg/dL (ref 6–20)
CO2: 21 mmol/L — ABNORMAL LOW (ref 22–32)
Calcium: 8.4 mg/dL — ABNORMAL LOW (ref 8.9–10.3)
Chloride: 111 mmol/L (ref 98–111)
Creatinine, Ser: 2.17 mg/dL — ABNORMAL HIGH (ref 0.61–1.24)
GFR, Estimated: 34 mL/min — ABNORMAL LOW (ref >60)
Glucose, Bld: 112 mg/dL — ABNORMAL HIGH (ref 70–99)
Potassium: 3.6 mmol/L (ref 3.5–5.1)
Sodium: 141 mmol/L (ref 135–145)

## 2021-10-31 LAB — CBC
HCT: 39.7 % (ref 39.0–52.0)
Hemoglobin: 13.3 g/dL (ref 13.0–17.0)
MCH: 28.2 pg (ref 26.0–34.0)
MCHC: 33.5 g/dL (ref 30.0–36.0)
MCV: 84.3 fL (ref 80.0–100.0)
Platelets: 250 10*3/uL (ref 150–400)
RBC: 4.71 MIL/uL (ref 4.22–5.81)
RDW: 14.5 % (ref 11.5–15.5)
WBC: 9.9 10*3/uL (ref 4.0–10.5)
nRBC: 0 % (ref 0.0–0.2)

## 2021-10-31 LAB — MAGNESIUM: Magnesium: 1.7 mg/dL (ref 1.7–2.4)

## 2021-10-31 LAB — HIV ANTIBODY (ROUTINE TESTING W REFLEX): HIV Screen 4th Generation wRfx: NONREACTIVE

## 2021-10-31 MED ORDER — MAGNESIUM SULFATE 2 GM/50ML IV SOLN
2.0000 g | Freq: Once | INTRAVENOUS | Status: AC
  Administered 2021-10-31: 2 g via INTRAVENOUS
  Filled 2021-10-31: qty 50

## 2021-10-31 MED ORDER — SODIUM CHLORIDE 0.9 % IV SOLN: INTRAVENOUS | Status: DC

## 2021-10-31 MED ORDER — OXYCODONE HCL 5 MG PO TABS
5.0000 mg | ORAL_TABLET | ORAL | Status: DC | PRN
  Administered 2021-11-01 – 2021-11-04 (×11): 10 mg via ORAL
  Filled 2021-10-31 (×12): qty 2

## 2021-10-31 MED ORDER — POTASSIUM CHLORIDE 10 MEQ/100ML IV SOLN
10.0000 meq | INTRAVENOUS | Status: AC
  Administered 2021-10-31 (×2): 10 meq via INTRAVENOUS
  Filled 2021-10-31 (×2): qty 100

## 2021-10-31 NOTE — Progress Notes (Signed)
[place] Hospital Day(s): 1.   Post op day(s): 1 Day Post-Op.   Interval History:  Patient seen and examined No acute events or new complaints overnight.  Patient reports he is doing okay; expected abdominal discomfort especially with moving No fever, chills, emesis He is without leukocytosis; 9.9K Hgb is stable and in normal ranges at 13.3 Renal function near baseline, slightly above, sCr- 2.17; UO - 875 ccs No significant electrolyte derangements Surgical drain with 70 ccs out; serosanguinous NGT in place; 25 ccs recorded Minimal output from ileostomy; small amount of gas and liquid stool vs bowel sweat   Vital signs in last 24 hours: [min-max] current  Temp:  [97.2 F (36.2 C)-98.5 F (36.9 C)] 98.2 F (36.8 C) (10/25 0328) Pulse Rate:  [62-102] 94 (10/25 0328) Resp:  [10-20] 18 (10/25 0328) BP: (127-154)/(80-96) 135/89 (10/25 0328) SpO2:  [97 %-100 %] 97 % (10/25 0328)     Height: '5\' 10"'$  (177.8 cm) Weight: 101.6 kg BMI (Calculated): 32.14   Intake/Output last 2 shifts:  10/24 0701 - 10/25 0700 In: 2900 [I.V.:2400; IV Piggyback:500] Out: 3235 [Urine:875; Emesis/NG output:25; Drains:120; Stool:35; Blood:200]   Physical Exam:  Constitutional: alert, cooperative and no distress  HEENT: NGT in place; returned to LIS Respiratory: breathing non-labored at rest  Cardiovascular: regular rate and sinus rhythm  Gastrointestinal: Soft, expected incisional and generalized abdominal pain, and non-distended, no rebound guarding. Diverting loop colostomy in the right central abdomen; there is a small amount of gas and liquid stool vs bowel sweat in bag. Surgical drain in the RLQ; serosanguinous  Genitourinary; Foley in place; hematuria  Integumentary: Midline wound closed with staples, penrose, and dressed with honeycomb. Scant amount of drainage on dressing   Labs:     Latest Ref Rng & Units 10/31/2021    4:02 AM 10/30/2021    6:31 AM  08/02/2021    5:04 AM  CBC  WBC 4.0 - 10.5 K/uL 9.9  4.9  7.4   Hemoglobin 13.0 - 17.0 g/dL 13.3  15.2  14.6   Hematocrit 39.0 - 52.0 % 39.7  45.7  43.8   Platelets 150 - 400 K/uL 250  304  296       Latest Ref Rng & Units 10/31/2021    4:02 AM 10/30/2021    6:31 AM 08/02/2021    5:04 AM  CMP  Glucose 70 - 99 mg/dL 112  90  105   BUN 6 - 20 mg/dL 20  18  39   Creatinine 0.61 - 1.24 mg/dL 2.17  1.80  1.97   Sodium 135 - 145 mmol/L 141  140  136   Potassium 3.5 - 5.1 mmol/L 3.6  3.5  4.6   Chloride 98 - 111 mmol/L 111  106  105   CO2 22 - 32 mmol/L '21  25  25   '$ Calcium 8.9 - 10.3 mg/dL 8.4  9.6  9.3   Total Protein 6.5 - 8.1 g/dL  8.9  7.7   Total Bilirubin 0.3 - 1.2 mg/dL  1.1  0.8   Alkaline Phos 38 - 126 U/L  58  59   AST 15 - 41 U/L  28  44   ALT 0 - 44 U/L  26  76      Imaging studies: No new pertinent imaging studies   Assessment/Plan:  61 y.o. male 1 Day Post-Op s/p open right colectomy with ileocolic side-to-side anastomosis, open sigmoid colectomy with stapled end-to-end colorectal  anastomosis, and creation of diverting loop ileostomy for neuroendocrine tumor of the appendix   - Continue NPO for now; high likelihood to develop ileus given extensive nature of surgery and bowel manipulation intra-op. - Continue  NGT decompression for now; LIS; monitor and   - Continue IVF Resuscitation; switched to NS  - Continue foley catheter; monitor and record UO - Complete peri-operative Abx - Continue Entereg until definitive bowel function returns - Monitor abdominal examination; on-going ostomy function - Continue surgical drain; monitor and record output - Pain control prn; antiemetic prn   - WOC on-board for ostomy care/teaching   - Morning labs - OOB tomorrow    All of the above findings and recommendations were discussed with the patient, patient's family, and the medical team, and all of patient's and family's questions were answered to their expressed  satisfaction.  -- Edison Simon, PA-C [facility] 10/31/2021, 7:24 AM M-F: 7am - 4pm

## 2021-10-31 NOTE — TOC Initial Note (Signed)
Transition of Care [facility]) - Initial/Assessment Note    Patient Details  Name: Patrick Pittman MRN: [id number] Date of Birth: 28-Jun-1960  Transition of Care [facility]) CM/SW Contact:    Beverly Sessions, RN Phone Number: 10/31/2021, 9:45 AM  Clinical Narrative:                       Transition of Care (TOC) Screening Note   Patient Details  Name: Patrick Pittman Date of Birth: 1960-02-18   Transition of Care [facility]) CM/SW Contact:    Beverly Sessions, RN Phone Number: 10/31/2021, 9:45 AM    Transition of Care Department [facility]) has reviewed patient and no TOC needs have been identified at this time. We will continue to monitor patient advancement through interdisciplinary progression rounds. If new patient transition needs arise, please place a TOC consult.     Patient Goals and CMS Choice        Expected Discharge Plan and Services                                                Prior Living Arrangements/Services                       Activities of Daily Living Home Assistive Devices/Equipment: None ADL Screening (condition at time of admission) Patient's cognitive ability adequate to safely complete daily activities?: Yes Is the patient deaf or have difficulty hearing?: No Does the patient have difficulty seeing, even when wearing glasses/contacts?: No Does the patient have difficulty concentrating, remembering, or making decisions?: No Patient able to express need for assistance with ADLs?: No Does the patient have difficulty dressing or bathing?: No Independently performs ADLs?: Yes (appropriate for developmental age) Does the patient have difficulty walking or climbing stairs?: No Weakness of Legs: None Weakness of Arms/Hands: None  Permission Sought/Granted                  Emotional Assessment              Admission diagnosis:  Primary malignant neuroendocrine tumor of appendix ([place]) [C7A.8] Patient Active Problem List    Diagnosis Date Noted   Primary malignant neuroendocrine tumor of appendix ([place]) 10/30/2021   Family hx of prostate cancer 08/21/2021   HTN (hypertension) 08/21/2021   Neuroendocrine carcinoma of appendix ([place]) 08/18/2021   Renal cell carcinoma ([place]) 08/18/2021   Left renal mass 07/23/2021   Mass of appendix    Fistula of appendix    Genetic testing 05/28/2021   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

## 2021-10-31 NOTE — Anesthesia Postprocedure Evaluation (Signed)
Anesthesia Post Note  Patient: Patrick Pittman  Procedure(s) Performed: COLON RESECTION RIGHT; DIVERTING ILEOSTOMY, Edison Simon PAC to assist (Abdomen) PARTIAL COLECTOMY; SIGMOID COLECTOMY (Abdomen) CYSTOSCOPY WITH URETRAL  STENT PLACEMENT (Right)  Patient location during evaluation: PACU Anesthesia Type: General Level of consciousness: awake and alert Pain management: pain level controlled Vital Signs Assessment: post-procedure vital signs reviewed and stable Respiratory status: spontaneous breathing, nonlabored ventilation, respiratory function stable and patient connected to nasal cannula oxygen Cardiovascular status: blood pressure returned to baseline and stable Postop Assessment: no apparent nausea or vomiting Anesthetic complications: no   No notable events documented.   Last Vitals:  Vitals:   10/30/21 2051 10/31/21 0328  BP: (!) 127/91 135/89  Pulse: (!) 102 94  Resp: 20 18  Temp: 36.7 C 36.8 C  SpO2: 100% 97%    Last Pain:  Vitals:   10/31/21 0352  TempSrc:   PainSc: 3                  Ilene Qua

## 2021-11-01 LAB — CBC
HCT: 37.7 % — ABNORMAL LOW (ref 39.0–52.0)
Hemoglobin: 13 g/dL (ref 13.0–17.0)
MCH: 29.1 pg (ref 26.0–34.0)
MCHC: 34.5 g/dL (ref 30.0–36.0)
MCV: 84.5 fL (ref 80.0–100.0)
Platelets: 241 10*3/uL (ref 150–400)
RBC: 4.46 MIL/uL (ref 4.22–5.81)
RDW: 14.9 % (ref 11.5–15.5)
WBC: 10 10*3/uL (ref 4.0–10.5)
nRBC: 0 % (ref 0.0–0.2)

## 2021-11-01 LAB — BASIC METABOLIC PANEL
Anion gap: 7 (ref 5–15)
BUN: 25 mg/dL — ABNORMAL HIGH (ref 6–20)
CO2: 23 mmol/L (ref 22–32)
Calcium: 8.5 mg/dL — ABNORMAL LOW (ref 8.9–10.3)
Chloride: 116 mmol/L — ABNORMAL HIGH (ref 98–111)
Creatinine, Ser: 2.12 mg/dL — ABNORMAL HIGH (ref 0.61–1.24)
GFR, Estimated: 35 mL/min — ABNORMAL LOW (ref >60)
Glucose, Bld: 88 mg/dL (ref 70–99)
Potassium: 3.9 mmol/L (ref 3.5–5.1)
Sodium: 146 mmol/L — ABNORMAL HIGH (ref 135–145)

## 2021-11-01 LAB — PHOSPHORUS: Phosphorus: 2.3 mg/dL — ABNORMAL LOW (ref 2.5–4.6)

## 2021-11-01 LAB — MAGNESIUM: Magnesium: 2.5 mg/dL — ABNORMAL HIGH (ref 1.7–2.4)

## 2021-11-01 MED ORDER — POTASSIUM & SODIUM PHOSPHATES 280-160-250 MG PO PACK
2.0000 | PACK | Freq: Three times a day (TID) | ORAL | Status: AC
  Administered 2021-11-01: 2 via ORAL
  Filled 2021-11-01: qty 2

## 2021-11-01 MED ORDER — CHLORHEXIDINE GLUCONATE CLOTH 2 % EX PADS
6.0000 | MEDICATED_PAD | Freq: Every day | CUTANEOUS | Status: DC
  Administered 2021-11-01 – 2021-11-04 (×4): 6 via TOPICAL

## 2021-11-01 NOTE — Progress Notes (Signed)
Mobility Specialist - Progress Note   11/01/21 1536  Mobility  Activity Ambulated with assistance in hallway  Level of Assistance Standby assist, set-up cues, supervision of patient - no hands on  Assistive Device Front wheel walker  Distance Ambulated (ft) 800 ft  Activity Response Tolerated well  $Mobility charge 1 Mobility   Pt sitting in recliner on RA upon arrival. Pt STS and ambulates 5 laps around NS Supervision. Pt has no LOB during session. Pt left in recliner with needs in reach and sister in room.   Gretchen Short  Mobility Specialist  11/01/21 3:37 PM

## 2021-11-01 NOTE — Consult Note (Signed)
[place] Nurse ostomy follow up Stoma type/location: RUQ, loop ileostomy  Stomal assessment/size: 2" round, budded, 2 os noted, red rubber support bridge in place  Peristomal assessment: NA Treatment options for stomal/peristomal skin: NA Output none, sweat  Ostomy pouching: 2pc. 2 1/4" in place Despite planning a visit with wife at 64 this am, she decided to leave for work.  I spoke with the patient and his wife via phone and I will meet them at 0800 1027/23 for teaching. Enrolled patient in [place] Start Discharge program: Yes [place] Nurse will follow along with you for continued support with ostomy teaching and care [place] MSN, [place], [place], [place], [place]

## 2021-11-01 NOTE — Progress Notes (Addendum)
[facility] Hospital Day(s): 2.   Post op day(s): 2 Days Post-Op.   Interval History:  Patient seen and examined No acute events or new complaints overnight.  Patient reports he is feeling much better Still with abdominal soreness  No fever, chills, emesis, nausea He is without leukocytosis; 10.0K Hgb is stable and in normal ranges at 13.0 Renal function near baseline, stable, sCr- 2.12; UO - 1950 ccs Surgical drain with 85 ccs out; serosanguinous NGT reportedly fell out yesterday afternoon  Output from ileostomy; gas and scant liquid stool   Vital signs in last 24 hours: [min-max] current  Temp:  [97.8 F (36.6 C)-98.8 F (37.1 C)] 98.8 F (37.1 C) (10/26 0412) Pulse Rate:  [80-86] 86 (10/26 0412) Resp:  [18] 18 (10/26 0412) BP: (123-143)/(80-92) 139/92 (10/26 0412) SpO2:  [97 %-99 %] 97 % (10/26 0412)     Height: '5\' 10"'$  (177.8 cm) Weight: 101.6 kg BMI (Calculated): 32.14   Intake/Output last 2 shifts:  10/25 0701 - 10/26 0700 In: 1689.9 [I.V.:1189.9; IV Piggyback:500] Out: 2460 [TAVWP:7948; Drains:85; Stool:425]   Physical Exam:  Constitutional: alert, cooperative and no distress  Respiratory: breathing non-labored at rest  Cardiovascular: regular rate and sinus rhythm  Gastrointestinal: Soft, expected incisional and generalized abdominal pain, and non-distended, no rebound guarding. Diverting loop colostomy in the right central abdomen; there is a small amount of gas and liquid stool. Surgical drain in the RLQ; serosanguinous Genitourinary; Foley in place; urine clearing  Integumentary: Midline wound closed with staples, penrose. Honeycomb removed this AM and replaced with dry gauze. Scant amount of drainage on dressing   Labs:     Latest Ref Rng & Units 11/01/2021    5:25 AM 10/31/2021    4:02 AM 10/30/2021    6:31 AM  CBC  WBC 4.0 - 10.5 K/uL 10.0  9.9  4.9   Hemoglobin 13.0 - 17.0 g/dL 13.0  13.3  15.2   Hematocrit  39.0 - 52.0 % 37.7  39.7  45.7   Platelets 150 - 400 K/uL 241  250  304       Latest Ref Rng & Units 11/01/2021    5:25 AM 10/31/2021    4:02 AM 10/30/2021    6:31 AM  CMP  Glucose 70 - 99 mg/dL 88  112  90   BUN 6 - 20 mg/dL '25  20  18   '$ Creatinine 0.61 - 1.24 mg/dL 2.12  2.17  1.80   Sodium 135 - 145 mmol/L 146  141  140   Potassium 3.5 - 5.1 mmol/L 3.9  3.6  3.5   Chloride 98 - 111 mmol/L 116  111  106   CO2 22 - 32 mmol/L '23  21  25   '$ Calcium 8.9 - 10.3 mg/dL 8.5  8.4  9.6   Total Protein 6.5 - 8.1 g/dL   8.9   Total Bilirubin 0.3 - 1.2 mg/dL   1.1   Alkaline Phos 38 - 126 U/L   58   AST 15 - 41 U/L   28   ALT 0 - 44 U/L   26      Imaging studies: No new pertinent imaging studies   Assessment/Plan:  60 y.o. 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   - Advance to CLD  - Continue IVF Resuscitation;  switched to NS  - Discontinue foley catheter this morning  - Continue Entereg today; Okay to discontinue tomorrow (10/27) - Monitor abdominal examination; on-going ostomy function - Continue surgical drain; monitor and record output - Pain control prn; antiemetic prn   - WOC on-board for ostomy care/teaching   - OOB; ambulated well yesterday   All of the above findings and recommendations were discussed with the patient, patient's family, and the medical team, and all of patient's and family's questions were answered to their expressed satisfaction.  -- Edison Simon, PA-C [facility] 11/01/2021, 7:18 AM M-F: 7am - 4pm

## 2021-11-02 LAB — BASIC METABOLIC PANEL
Anion gap: 6 (ref 5–15)
BUN: 21 mg/dL — ABNORMAL HIGH (ref 6–20)
CO2: 25 mmol/L (ref 22–32)
Calcium: 8.3 mg/dL — ABNORMAL LOW (ref 8.9–10.3)
Chloride: 112 mmol/L — ABNORMAL HIGH (ref 98–111)
Creatinine, Ser: 1.89 mg/dL — ABNORMAL HIGH (ref 0.61–1.24)
GFR, Estimated: 40 mL/min — ABNORMAL LOW (ref >60)
Glucose, Bld: 95 mg/dL (ref 70–99)
Potassium: 3.4 mmol/L — ABNORMAL LOW (ref 3.5–5.1)
Sodium: 143 mmol/L (ref 135–145)

## 2021-11-02 LAB — CBC
HCT: 36.3 % — ABNORMAL LOW (ref 39.0–52.0)
Hemoglobin: 12 g/dL — ABNORMAL LOW (ref 13.0–17.0)
MCH: 27.9 pg (ref 26.0–34.0)
MCHC: 33.1 g/dL (ref 30.0–36.0)
MCV: 84.4 fL (ref 80.0–100.0)
Platelets: 226 10*3/uL (ref 150–400)
RBC: 4.3 MIL/uL (ref 4.22–5.81)
RDW: 15 % (ref 11.5–15.5)
WBC: 8.5 10*3/uL (ref 4.0–10.5)
nRBC: 0 % (ref 0.0–0.2)

## 2021-11-02 LAB — SURGICAL PATHOLOGY

## 2021-11-02 MED ORDER — POTASSIUM CHLORIDE CRYS ER 20 MEQ PO TBCR
40.0000 meq | EXTENDED_RELEASE_TABLET | Freq: Once | ORAL | Status: AC
  Administered 2021-11-02: 40 meq via ORAL
  Filled 2021-11-02: qty 2

## 2021-11-02 MED ORDER — OXYCODONE HCL 5 MG PO TABS: 5.0000 mg | ORAL_TABLET | Freq: Four times a day (QID) | ORAL | 0 refills | Status: DC | PRN

## 2021-11-02 MED ORDER — IBUPROFEN 600 MG PO TABS: 600.0000 mg | ORAL_TABLET | Freq: Four times a day (QID) | ORAL | 0 refills | Status: DC | PRN

## 2021-11-02 NOTE — TOC Initial Note (Signed)
Transition of Care [facility]) - Initial/Assessment Note    Patient Details  Name: Patrick Pittman MRN: [id number] Date of Birth: 02/03/1960  Transition of [facility] CM/SW Contact:    Beverly Sessions, RN Phone Number: 11/02/2021, 2:00 PM  Clinical Narrative:                    Admitted WNU:[id number] op new ostomy Admitted from: home with wife [id number] Current home health/prior home health/DME: NA  Discussed option of home health services with patient for RN related to ostomy.  Patient is agreeable and states he does not have a preference of home health aency.  Referral made and accepted by [place] with [facility]  Patient states at discharge his wife will transport Requested that bedside RN to send home with some ostomy supplies Surgery to place consult for ostomy clinic       Patient Goals and CMS Choice        Expected Discharge Plan and Services                                                Prior Living Arrangements/Services                       Activities of Daily Living Home Assistive Devices/Equipment: None ADL Screening (condition at time of admission) Patient's cognitive ability adequate to safely complete daily activities?: Yes Is the patient deaf or have difficulty hearing?: No Does the patient have difficulty seeing, even when wearing glasses/contacts?: No Does the patient have difficulty concentrating, remembering, or making decisions?: No Patient able to express need for assistance with ADLs?: No Does the patient have difficulty dressing or bathing?: No Independently performs ADLs?: Yes (appropriate for developmental age) Does the patient have difficulty walking or climbing stairs?: No Weakness of Legs: None Weakness of Arms/Hands: None  Permission Sought/Granted                  Emotional Assessment              Admission diagnosis:  Primary malignant neuroendocrine tumor of appendix [facility]) [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.8] Patient Active  Problem List   Diagnosis Date Noted   Primary malignant neuroendocrine tumor of appendix ([place]) 10/30/2021   Family hx of prostate cancer 08/21/2021   HTN (hypertension) 08/21/2021   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

## 2021-11-02 NOTE — Consult Note (Signed)
[place] Nurse ostomy follow up Stoma type/location: loop ileostomy with red rubber support bridge Stomal assessment/size: 2" x 1 1/2" oblong; loop stoma, budded with functional os at 6 o'clock  Peristomal assessment: intact  Treatment options for stomal/peristomal skin: 2" skin barrier ring Output liquid green Ostomy pouching: 1pc./2pc.  Education provided:  Explained role of ostomy nurse and creation of stoma  Explained stoma characteristics (budded, flush, color, texture, care) Demonstrated pouch change (cutting new skin barrier, measuring stoma, cleaning peristomal skin and stoma, use of barrier ring) Allowed patient's wife to cut new skin barrier Concept of measuring seemed to overwhelm them both Education on emptying when 1/3 to 1/2 full and how to empty Demonstrated "burping" flatus from pouch Demonstrated use of wick to clean spout  Discussed bathing, diet, gas, medication use, dehydration  Discussed risk if dehydration at length, recommend 8oz of fluid with each pouch empty Discussed food blockage , chewing food, foods to limit or that may be more of a risk for blockage  Discussed risk of peristomal hernia; follow lifting restrictions given by surgery team  Provided patient with [employer] and marked items currently using Answered patient/family questions:  patient desires to have [employer], explained that some insurance policies may not cover but that I would request TOC and MD to make referrals    Enrolled patient in [place] Start Discharge program: Yes  [place] Nurse will follow along with you for continued support with ostomy teaching and care [place] MSN, RN, [place], [place], [place]

## 2021-11-02 NOTE — Plan of Care (Signed)
  Problem: Education: Goal: Knowledge of General Education information will improve Description Including pain rating scale, medication(s)/side effects and non-pharmacologic comfort measures Outcome: Progressing   Problem: Health Behavior/Discharge Planning: Goal: Ability to manage health-related needs will improve Outcome: Progressing   

## 2021-11-02 NOTE — Discharge Instructions (Signed)
In addition to included general post-operative instructions,  Diet: Resume home diet.   Activity: No heavy lifting >20 pounds (children, pets, laundry, garbage) or strenuous activity for 6 weeks, but light activity and walking are encouraged. Do not drive or drink alcohol if taking narcotic pain medications or having pain that might distract from driving.  Wound care: IF you can keep drain site covered, you may shower/get incision wet with soapy water and pat dry (do not rub incisions), but no baths or submerging incision underwater until follow-up. We will plan to remove staples in follow up.   Drain: Monitor and record drain output daily; hand outs given   Medications: Resume all home medications. For mild to moderate pain: acetaminophen (Tylenol) or ibuprofen/naproxen (if no kidney disease). Combining Tylenol with alcohol can substantially increase your risk of causing liver disease. Narcotic pain medications, if prescribed, can be used for severe pain, though may cause nausea, constipation, and drowsiness. Do not combine Tylenol and Percocet (or similar) within a 6 hour period as Percocet (and similar) contain(s) Tylenol. If you do not need the narcotic pain medication, you do not need to fill the prescription.  Call office [phone]) at any time if any questions, worsening pain, fevers/chills, bleeding, drainage from incision site, or other concerns.

## 2021-11-02 NOTE — Progress Notes (Signed)
[facility] Hospital Day(s): 3.   Post op day(s): 3 Days Post-Op.   Interval History:  Patient seen and examined No acute events or new complaints overnight.  Patient reports he is getting better Abdominal discomfort improving  No fever, chills, emesis, nausea He is without leukocytosis; 8.5K Hgb is stable - 12.0 Renal function baseline, stable, sCr- 1.89; UO - 675 ccs + unmeasured Surgical drain with 130 ccs out; serosanguinous He is on CLD; tolerating well. Asking for more  Output from ileostomy   Vital signs in last 24 hours: [min-max] current  Temp:  [97.3 F (36.3 C)-98.4 F (36.9 C)] 98.3 F (36.8 C) (10/27 0749) Pulse Rate:  [78-101] 78 (10/27 0749) Resp:  [12-19] 12 (10/27 0749) BP: (137-149)/(85-97) 149/90 (10/27 0749) SpO2:  [96 %-100 %] 98 % (10/27 0749)     Height: '5\' 10"'$  (177.8 cm) Weight: 101.6 kg BMI (Calculated): 32.14   Intake/Output last 2 shifts:  10/26 0701 - 10/27 0700 In: 2898.7 [P.O.:780; I.V.:2118.7] Out: 1105 [Urine:675; Drains:130; Stool:300]   Physical Exam:  Constitutional: alert, cooperative and no distress  Respiratory: breathing non-labored at rest  Cardiovascular: regular rate and sinus rhythm  Gastrointestinal: Soft, expected incisional and generalized abdominal pain, and non-distended, no rebound guarding. Diverting loop colostomy in the right central abdomen; there is a small amount of gas and liquid stool. Surgical drain in the RLQ; serosanguinous Integumentary: Midline wound closed with staples, penrose.   Labs:     Latest Ref Rng & Units 11/02/2021    4:26 AM 11/01/2021    5:25 AM 10/31/2021    4:02 AM  CBC  WBC 4.0 - 10.5 K/uL 8.5  10.0  9.9   Hemoglobin 13.0 - 17.0 g/dL 12.0  13.0  13.3   Hematocrit 39.0 - 52.0 % 36.3  37.7  39.7   Platelets 150 - 400 K/uL 226  241  250       Latest Ref Rng & Units 11/02/2021    4:26 AM 11/01/2021    5:25 AM 10/31/2021    4:02 AM  CMP  Glucose  70 - 99 mg/dL 95  88  112   BUN 6 - 20 mg/dL '21  25  20   '$ Creatinine 0.61 - 1.24 mg/dL 1.89  2.12  2.17   Sodium 135 - 145 mmol/L 143  146  141   Potassium 3.5 - 5.1 mmol/L 3.4  3.9  3.6   Chloride 98 - 111 mmol/L 112  116  111   CO2 22 - 32 mmol/L '25  23  21   '$ Calcium 8.9 - 10.3 mg/dL 8.3  8.5  8.4      Imaging studies: No new pertinent imaging studies   Assessment/Plan:  61 y.o. 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 Okay to advance to soft diet this afternoon/evening  - Discontinue IVF  - Discontinue Entereg today - Monitor abdominal examination; on-going ostomy function - Continue surgical drain; monitor and record output - Pain control prn; antiemetic prn   - WOC on-board for ostomy care/teaching   - OOB; ambulated well yesterday    - Discharge Planning; Advancing diet today. Anticipate DC tomorrow (10/28). I will do Rx and schedule follow today.   All of the above findings and recommendations were discussed with the patient,  patient's family, and the medical team, and all of patient's and family's questions were answered to their expressed satisfaction.  -- Edison Simon, PA-C [facility] 11/02/2021, 7:51 AM M-F: 7am - 4pm

## 2021-11-03 MED ORDER — PANTOPRAZOLE SODIUM 40 MG PO TBEC
40.0000 mg | DELAYED_RELEASE_TABLET | Freq: Every day | ORAL | Status: DC
  Administered 2021-11-03: 40 mg via ORAL
  Filled 2021-11-03: qty 1

## 2021-11-03 MED ORDER — IBUPROFEN 400 MG PO TABS
600.0000 mg | ORAL_TABLET | Freq: Four times a day (QID) | ORAL | Status: DC | PRN
  Administered 2021-11-03: 600 mg via ORAL
  Filled 2021-11-03: qty 2

## 2021-11-03 NOTE — Progress Notes (Signed)
[facility] Hospital Day(s): 4.   Post op day(s): 4 Days Post-Op.   Interval History:  Patient seen and examined No acute events or new complaints overnight.  Patient reports he is getting better, though feels that he is pain cannot be adequately managed at home today. Abdominal discomfort stable No fever, chills, emesis, nausea Surgical drain with 60 ccs out; serosanguinous He is on soft; tolerating well. Asking for more  Output from ileostomy   Vital signs in last 24 hours: [min-max] current  Temp:  [98.3 F (36.8 C)-99 F (37.2 C)] 98.3 F (36.8 C) (10/28 0756) Pulse Rate:  [76-84] 76 (10/28 0756) Resp:  [16-20] 16 (10/28 0756) BP: (133-155)/(89-96) 135/92 (10/28 0756) SpO2:  [97 %-99 %] 98 % (10/28 0756)     Height: '5\' 10"'$  (177.8 cm) Weight: 101.6 kg BMI (Calculated): 32.14   Intake/Output last 2 shifts:  10/27 0701 - 10/28 0700 In: 480 [P.O.:480] Out: 760 [Drains:60; Stool:700]   Physical Exam:  Constitutional: alert, cooperative and no distress  Respiratory: breathing non-labored at rest  Cardiovascular: regular rate and sinus rhythm  Gastrointestinal: Soft, expected incisional and generalized abdominal pain, and non-distended, no rebound guarding. Diverting loop colostomy in the right central abdomen; there is a small amount of gas and liquid stool. Surgical drain in the RLQ; serosanguinous Integumentary: Midline wound closed with staples, penrose.   Labs:     Latest Ref Rng & Units 11/02/2021    4:26 AM 11/01/2021    5:25 AM 10/31/2021    4:02 AM  CBC  WBC 4.0 - 10.5 K/uL 8.5  10.0  9.9   Hemoglobin 13.0 - 17.0 g/dL 12.0  13.0  13.3   Hematocrit 39.0 - 52.0 % 36.3  37.7  39.7   Platelets 150 - 400 K/uL 226  241  250       Latest Ref Rng & Units 11/02/2021    4:26 AM 11/01/2021    5:25 AM 10/31/2021    4:02 AM  CMP  Glucose 70 - 99 mg/dL 95  88  112   BUN 6 - 20 mg/dL '21  25  20   '$ Creatinine 0.61 - 1.24 mg/dL  1.89  2.12  2.17   Sodium 135 - 145 mmol/L 143  146  141   Potassium 3.5 - 5.1 mmol/L 3.4  3.9  3.6   Chloride 98 - 111 mmol/L 112  116  111   CO2 22 - 32 mmol/L '25  23  21   '$ Calcium 8.9 - 10.3 mg/dL 8.3  8.5  8.4      Imaging studies: No new pertinent imaging studies   Assessment/Plan:  61 y.o. 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 Okay to advance to soft diet this afternoon/evening  - Discontinue IVF  - Discontinue Entereg today - Monitor abdominal examination; on-going ostomy function - Continue surgical drain; monitor and record output - Pain control prn; antiemetic prn   - WOC on-board for ostomy care/teaching   - OOB; ambulated well    - Discharge Planning; Advancing diet today, weaning from IV pain meds. Anticipate DC tomorrow (10/29).   All of the above findings and recommendations were discussed with the patient, patient's family, and the medical team, and all of patient's and family's questions were answered to  their expressed satisfaction.  Ronny Bacon, M.D., [facility]  11/03/2021 ; 1:28 PM

## 2021-11-03 NOTE — Progress Notes (Signed)
PHARMACIST - PHYSICIAN COMMUNICATION  CONCERNING: IV to Oral Route Change Policy  RECOMMENDATION: This patient is receiving pantoprazole by the intravenous route.  Based on criteria approved by the Pharmacy and Therapeutics Committee, the intravenous medication(s) is/are being converted to the equivalent oral dose form(s).   DESCRIPTION: These criteria include: The patient is eating (either orally or via tube) and/or has been taking other orally administered medications for a least 24 hours The patient has no evidence of active gastrointestinal bleeding or impaired GI absorption (gastrectomy, short bowel, patient on TNA or NPO).  If you have questions about this conversion, please contact the [place], [facility] 11/03/2021 12:05 PM

## 2021-11-03 NOTE — Plan of Care (Signed)
  Problem: Education: Goal: Knowledge of General Education information will improve Description Including pain rating scale, medication(s)/side effects and non-pharmacologic comfort measures Outcome: Progressing   Problem: Health Behavior/Discharge Planning: Goal: Ability to manage health-related needs will improve Outcome: Progressing   

## 2021-11-03 NOTE — Progress Notes (Signed)
Mobility Specialist - Progress Note    11/03/21 1555  Mobility  Activity Ambulated independently in hallway;Stood at bedside  Level of Assistance Independent  Assistive Device Front wheel walker  Distance Ambulated (ft) 960 ft  Activity Response Tolerated well  Mobility Referral Yes  $Mobility charge 1 Mobility   Pt in recliner upon entry on RA. STS indep and ambulated in the hallway. Pt returned to chair and left with needs in reach.   Loma Sender Mobility Specialist 11/03/21, 4:01 PM

## 2021-11-04 NOTE — Discharge Summary (Signed)
Physician Discharge Summary  Patient ID: Patrick Pittman MRN: [id number] DOB/AGE: [birth year] 61 y.o.  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  Gradually advanced in diet over the 4 days postop.  Pain adequately controlled.  No evidence of infection, wound complication or otherwise.  Consults: None  Significant Diagnostic Studies: None  Treatments: surgery: As noted above  Discharge Exam: Blood pressure 122/82, pulse 67, temperature (!) 97.5 F (36.4 C), temperature source Oral, resp. rate 16, height '5\' 10"'$  (1.778 m), weight 101.6 kg, SpO2 99 %. General appearance: alert, cooperative, and no distress GI: normal findings: Penrose drain in midline incision, right-sided loop ileostomy with appliance intact and secured, with red Robinson drain bridge present. Incision/Wound: Clean and dry no evidence of infection.  Disposition: Discharge disposition: 01-Home or Self Care       Discharge Instructions     Call MD for:  persistant nausea and vomiting   Complete by: As directed    Call MD for:  redness, tenderness, or signs of infection (pain, swelling, redness, odor or green/yellow discharge around incision site)   Complete by: As directed    Call MD for:  severe uncontrolled pain   Complete by: As directed    Diet - low sodium heart healthy   Complete by: As directed    Discharge instructions   Complete by: As directed    May resume aspirin or other anticoagulants after 48 hours.   Driving Restrictions   Complete by: As directed    No driving until cleared after follow-up  appointment.  Is not advised to drive while taking narcotic pain medications or in significant pain.   Increase activity slowly   Complete by: As directed    Lifting restrictions   Complete by: As directed    Strongly advised against any form of lifting greater than 15 pounds over the next 4 to 6 weeks.  This involves pushing/pulling movements as well.  After 4 weeks when may gradually engage in more activities remaining aware of any new pain/tenderness elicited, and avoiding those for the full duration of 6 weeks.  Walking is encouraged.  Climbing stairs with caution.      Allergies as of 11/04/2021   No Known Allergies      Medication List     TAKE these medications    ibuprofen 600 MG tablet Commonly known as: ADVIL Take 1 tablet (600 mg total) by mouth every 6 (six) hours as needed.   loratadine 10 MG tablet Commonly known as: CLARITIN Take 10 mg by mouth daily.   oxyCODONE 5 MG immediate release tablet Commonly known as: Oxy IR/ROXICODONE Take 1 tablet (5 mg total) by mouth every 6 (six) hours as needed for severe pain or breakthrough pain.   sildenafil 20 MG tablet Commonly known as: REVATIO Take 20 mg by mouth. Take 2-5 tablets by mouth as needed   valsartan-hydrochlorothiazide 80-12.5 MG tablet Commonly known as: DIOVAN-HCT Take 1 tablet by mouth daily.        Follow-up Information     Olean Ree, MD. Go on 11/09/2021.   Specialty: General Surgery Why: Go to appointment on 11/03 at 11 am Contact information: [address]  [address] [place] [address] [phone]         [facility]. Schedule an appointment as soon as possible for a visit in 2 week(s).   Specialty: General Surgery Why: New diverting loop ileostomy Contact information: [address] [id number] [place] [phone]                Signed: Ronny Bacon, M.D., [facility] 11/04/2021, 9:50 AM

## 2021-11-04 NOTE — Plan of Care (Signed)
Problem: Education: Goal: Knowledge of General Education information will improve Description: Including pain rating scale, medication(s)/side effects and non-pharmacologic comfort measures 11/04/2021 1113 by Earley Brooke, RN Outcome: Adequate for Discharge 11/04/2021 1113 by Earley Brooke, RN Outcome: Adequate for Discharge 11/04/2021 1112 by Earley Brooke, RN Outcome: Adequate for Discharge   Problem: Health Behavior/Discharge Planning: Goal: Ability to manage health-related needs will improve 11/04/2021 1113 by Earley Brooke, RN Outcome: Adequate for Discharge 11/04/2021 1113 by Earley Brooke, RN Outcome: Adequate for Discharge 11/04/2021 1112 by Earley Brooke, RN Outcome: Adequate for Discharge   Problem: Clinical Measurements: Goal: Ability to maintain clinical measurements within normal limits will improve 11/04/2021 1113 by Earley Brooke, RN Outcome: Adequate for Discharge 11/04/2021 1113 by Earley Brooke, RN Outcome: Adequate for Discharge 11/04/2021 1112 by Earley Brooke, RN Outcome: Adequate for Discharge Goal: Will remain free from infection 11/04/2021 1113 by Earley Brooke, RN Outcome: Adequate for Discharge 11/04/2021 1113 by Earley Brooke, RN Outcome: Adequate for Discharge 11/04/2021 1112 by Earley Brooke, RN Outcome: Adequate for Discharge Goal: Diagnostic test results will improve 11/04/2021 1113 by Earley Brooke, RN Outcome: Adequate for Discharge 11/04/2021 1113 by Earley Brooke, RN Outcome: Adequate for Discharge 11/04/2021 1112 by Earley Brooke, RN Outcome: Adequate for Discharge Goal: Respiratory complications will improve 11/04/2021 1113 by Earley Brooke, RN Outcome: Adequate for Discharge 11/04/2021 1113 by Earley Brooke, RN Outcome: Adequate for Discharge 11/04/2021 1112 by Earley Brooke, RN Outcome: Adequate for Discharge Goal: Cardiovascular complication will be avoided 11/04/2021 1113 by Earley Brooke, RN Outcome: Adequate for Discharge 11/04/2021 1113 by Earley Brooke,  RN Outcome: Adequate for Discharge 11/04/2021 1112 by Earley Brooke, RN Outcome: Adequate for Discharge   Problem: Clinical Measurements: Goal: Diagnostic test results will improve 11/04/2021 1113 by Earley Brooke, RN Outcome: Adequate for Discharge 11/04/2021 1113 by Earley Brooke, RN Outcome: Adequate for Discharge 11/04/2021 1112 by Earley Brooke, RN Outcome: Adequate for Discharge   Problem: Clinical Measurements: Goal: Respiratory complications will improve 11/04/2021 1113 by Earley Brooke, RN Outcome: Adequate for Discharge 11/04/2021 1113 by Earley Brooke, RN Outcome: Adequate for Discharge 11/04/2021 1112 by Earley Brooke, RN Outcome: Adequate for Discharge   Problem: Activity: Goal: Risk for activity intolerance will decrease 11/04/2021 1113 by Earley Brooke, RN Outcome: Adequate for Discharge 11/04/2021 1113 by Earley Brooke, RN Outcome: Adequate for Discharge 11/04/2021 1112 by Earley Brooke, RN Outcome: Adequate for Discharge   Problem: Nutrition: Goal: Adequate nutrition will be maintained 11/04/2021 1113 by Earley Brooke, RN Outcome: Adequate for Discharge 11/04/2021 1113 by Earley Brooke, RN Outcome: Adequate for Discharge 11/04/2021 1112 by Earley Brooke, RN Outcome: Adequate for Discharge   Problem: Elimination: Goal: Will not experience complications related to bowel motility 11/04/2021 1113 by Earley Brooke, RN Outcome: Adequate for Discharge 11/04/2021 1113 by Earley Brooke, RN Outcome: Adequate for Discharge 11/04/2021 1112 by Earley Brooke, RN Outcome: Adequate for Discharge Goal: Will not experience complications related to urinary retention 11/04/2021 1113 by Earley Brooke, RN Outcome: Adequate for Discharge 11/04/2021 1113 by Earley Brooke, RN Outcome: Adequate for Discharge 11/04/2021 1112 by Earley Brooke, RN Outcome: Adequate for Discharge   Problem: Coping: Goal: Level of anxiety will decrease 11/04/2021 1113 by Earley Brooke, RN Outcome: Adequate for  Discharge 11/04/2021 1113 by Earley Brooke, RN Outcome: Adequate for Discharge 11/04/2021 1112 by Earley Brooke, RN Outcome: Adequate for Discharge   Problem: Pain Managment: Goal: General experience of comfort will improve 11/04/2021 1113 by Earley Brooke, RN Outcome: Adequate for Discharge 11/04/2021 1113 by Earley Brooke, RN Outcome: Adequate  for Discharge 11/04/2021 1112 by Earley Brooke, RN Outcome: Adequate for Discharge   Problem: Skin Integrity: Goal: Risk for impaired skin integrity will decrease 11/04/2021 1113 by Earley Brooke, RN Outcome: Adequate for Discharge 11/04/2021 1113 by Earley Brooke, RN Outcome: Adequate for Discharge 11/04/2021 1112 by Earley Brooke, RN Outcome: Adequate for Discharge

## 2021-11-04 NOTE — Plan of Care (Signed)
  Problem: Elimination: Goal: Will not experience complications related to bowel motility 11/04/2021 1113 by Earley Brooke, RN Outcome: Adequate for Discharge 11/04/2021 1112 by Earley Brooke, RN Outcome: Adequate for Discharge Goal: Will not experience complications related to urinary retention 11/04/2021 1113 by Earley Brooke, RN Outcome: Adequate for Discharge 11/04/2021 1112 by Earley Brooke, RN Outcome: Adequate for Discharge   Problem: Elimination: Goal: Will not experience complications related to urinary retention 11/04/2021 1113 by Earley Brooke, RN Outcome: Adequate for Discharge 11/04/2021 1112 by Earley Brooke, RN Outcome: Adequate for Discharge   Problem: Pain Managment: Goal: General experience of comfort will improve 11/04/2021 1113 by Earley Brooke, RN Outcome: Adequate for Discharge 11/04/2021 1112 by Earley Brooke, RN Outcome: Adequate for Discharge   Problem: Safety: Goal: Ability to remain free from injury will improve 11/04/2021 1113 by Earley Brooke, RN Outcome: Adequate for Discharge 11/04/2021 1112 by Earley Brooke, RN Outcome: Adequate for Discharge   Problem: Skin Integrity: Goal: Risk for impaired skin integrity will decrease 11/04/2021 1113 by Earley Brooke, RN Outcome: Adequate for Discharge 11/04/2021 1112 by Earley Brooke, RN Outcome: Adequate for Discharge

## 2021-11-04 NOTE — TOC Transition Note (Signed)
Transition of Care [facility]) - CM/SW Discharge Note   Patient Details  Name: Patrick Pittman MRN: [id number] Date of Birth: April 10, 1960  Transition of Care [facility]) CM/SW Contact:  Valente David, RN Phone Number: 11/04/2021, 12:47 PM   Clinical Narrative:     Patient with discharge orders, bedside nurse reminded to provide ostomy supplies before going home.  Call placed to [place] to make aware of discharge, no answer.  Email sent to [place] to provide update. No further TOC needs.   Final next level of care: [place] Barriers to Discharge: Barriers Resolved   Patient Goals and CMS Choice Patient states their goals for this hospitalization and ongoing recovery are:: Home with home health      Discharge Placement                       Discharge Plan and Services                                     Social Determinants of Health (SDOH) Interventions     Readmission Risk Interventions     No data to display

## 2021-11-04 NOTE — Plan of Care (Signed)
  Problem: Education: Goal: Knowledge of General Education information will improve Description: Including pain rating scale, medication(s)/side effects and non-pharmacologic comfort measures Outcome: Adequate for Discharge   Problem: Health Behavior/Discharge Planning: Goal: Ability to manage health-related needs will improve Outcome: Adequate for Discharge   Problem: Clinical Measurements: Goal: Ability to maintain clinical measurements within normal limits will improve Outcome: Adequate for Discharge Goal: Will remain free from infection Outcome: Adequate for Discharge Goal: Diagnostic test results will improve Outcome: Adequate for Discharge Goal: Respiratory complications will improve Outcome: Adequate for Discharge Goal: Cardiovascular complication will be avoided Outcome: Adequate for Discharge   Problem: Pain Managment: Goal: General experience of comfort will improve Outcome: Adequate for Discharge   Problem: Skin Integrity: Goal: Risk for impaired skin integrity will decrease Outcome: Adequate for Discharge

## 2021-11-04 NOTE — Discharge Summary (Incomplete)
Discharge instructions reviewed with patient and spouse, both verbalized understanding. Jp drain in place at discharge.  Dressing change completed and extra supplies provided .No acute distress noted or verbalized at time of discharge. Patient discharged to home via personal vehicle.

## 2021-11-06 ENCOUNTER — Ambulatory Visit (INDEPENDENT_AMBULATORY_CARE_PROVIDER_SITE_OTHER): Payer: BC Managed Care – PPO | Admitting: Urology

## 2021-11-06 ENCOUNTER — Encounter: Payer: Self-pay | Admitting: Urology

## 2021-11-06 VITALS — BP 128/85 | HR 92 | Ht 70.0 in | Wt 204.0 lb

## 2021-11-06 DIAGNOSIS — Z905 Acquired absence of kidney: Secondary | ICD-10-CM

## 2021-11-06 DIAGNOSIS — C61 Malignant neoplasm of prostate: Secondary | ICD-10-CM

## 2021-11-06 LAB — URINALYSIS, COMPLETE
Bilirubin, UA: NEGATIVE
Glucose, UA: NEGATIVE
Ketones, UA: NEGATIVE
Nitrite, UA: NEGATIVE
Specific Gravity, UA: 1.02 (ref 1.005–1.030)
Urobilinogen, Ur: 0.2 mg/dL (ref 0.2–1.0)
pH, UA: 5 (ref 5.0–7.5)

## 2021-11-06 LAB — MICROSCOPIC EXAMINATION: RBC, Urine: >30 /hpf — AB (ref 0–2)

## 2021-11-06 MED ORDER — CEPHALEXIN 250 MG PO CAPS
500.0000 mg | ORAL_CAPSULE | Freq: Once | ORAL | Status: AC
  Administered 2021-11-06: 500 mg via ORAL

## 2021-11-06 NOTE — Progress Notes (Signed)
   11/06/21  CC:  Chief Complaint  Patient presents with   Procedure    Stent removal    HPI: 61 year old male with solitary kidney who underwent bowel resection as well as right ureteral stent placement returns to the office for stent removal.  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  Height '5\' 10"'$  (1.778 m). NED. A&Ox3.   No respiratory distress   Abd soft, NT, ND Normal phallus with bilateral descended testicles  Cystoscopy/ Stent removal procedure  Patient identification was confirmed, informed consent was obtained, and patient was prepped using Betadine solution.  Lidocaine jelly was administered per urethral meatus.    Preoperative abx where received prior to procedure.    Procedure: - Flexible cystoscope introduced, without any difficulty.   - Thorough search of the bladder revealed:    normal urethral meatus  Stent seen emanating from left ureteral orifice, grasped with stent graspers, and removed in entirety.     Post-Procedure: - Patient tolerated the procedure well -ppx keflex 500 mg x 1 given today   Assessment/ Plan:  1. Solitary kidney, acquired S/p stent removal today placed for surgical identification  Warning symptoms reviewed  RUS in 1 month - Urinalysis, Complete - US RENAL; Future  2. Prostate cancer [facility]) F/u in Feb with PSA   F/u as scheduled  Hollice Espy, MD

## 2021-11-07 ENCOUNTER — Encounter: Payer: BC Managed Care – PPO | Admitting: Urology

## 2021-11-09 ENCOUNTER — Other Ambulatory Visit: Payer: Self-pay

## 2021-11-09 ENCOUNTER — Encounter: Payer: Self-pay | Admitting: Surgery

## 2021-11-09 ENCOUNTER — Ambulatory Visit (INDEPENDENT_AMBULATORY_CARE_PROVIDER_SITE_OTHER): Payer: BC Managed Care – PPO | Admitting: Surgery

## 2021-11-09 VITALS — BP 143/64 | HR 105 | Temp 98.2°F | Ht 70.5 in | Wt 200.0 lb

## 2021-11-09 DIAGNOSIS — Z09 Encounter for follow-up examination after completed treatment for conditions other than malignant neoplasm: Secondary | ICD-10-CM

## 2021-11-09 DIAGNOSIS — C7A8 Other malignant neuroendocrine tumors: Secondary | ICD-10-CM

## 2021-11-09 NOTE — Progress Notes (Signed)
11/09/2021  HPI: Patrick Pittman is a 61 y.o. 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  Patient presents today for follow-up.  He reports that he has been doing much better and pain is improved and he is eating better.  Denies any worsening issues.  Drain has been having serosanguineous fluid and has been applying dressing changes to his midline incision and drain.  The ostomy is working well and has been doing better with appliance changes.  Vital signs: BP (!) 143/64   Pulse (!) 105   Temp 98.2 F (36.8 C) (Oral)   Ht 5' 10.5" (1.791 m)   Wt 200 lb (90.7 kg)   SpO2 99%   BMI 28.29 kg/m    Physical Exam: Constitutional: No acute distress Abdomen: Soft, nondistended, only with some soreness to palpation.  Midline incision is healing well and is clean, dry, intact with Penrose drain and staples in place.  Left lower quadrant Blake drain with serosanguineous fluid only.  Blake drain, Penrose drain, and staples were removed without complications.  Dry gauze dressing applied over drain sites and Steri-Strips applied over the incision.  The patient's red rubber catheter ostomy bridge was also removed without complications.  The ostomy itself is pink, with mild edema, but otherwise patent with stool in the bag.  Assessment/Plan: This is a 61 y.o. 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  - Patient is healing and recovering appropriately.  He is about 1.5 weeks out from surgery.  His drains and staples and red rubber catheter bridge were removed today without complications.  Instructed on how to do dressing changes for the drain sites until the wounds are closed. -Discussed the patient's pathology results with him.  Overall the right colectomy showed only one positive lymph node and the sigmoid colon was clear of any tumor burden. - Patient will follow-up with me in 3  weeks to reassess his progress.  Briefly discussed with him the plan for an outpatient contrast enema study in the future to evaluate the colorectal anastomosis prior to reversing the loop ileostomy likely early next year   [name], [place]

## 2021-11-09 NOTE — Patient Instructions (Addendum)
We removed staples today.We placed steri strips. These will begin to fall off in 7-14 days. We also removed the drains. You will need to place a gauze dressing over the top and bottom of the incision and secure with tape. Place a gauze dressing over the drain site .   Please call with any questions or concerns.   Please see your follow up appointment listed below.

## 2021-11-12 ENCOUNTER — Encounter: Payer: Self-pay | Admitting: Radiation Oncology

## 2021-11-12 ENCOUNTER — Ambulatory Visit
Admission: RE | Admit: 2021-11-12 | Discharge: 2021-11-12 | Disposition: A | Discharge status: Home / Self Care | Payer: BC Managed Care – PPO | Source: Ambulatory Visit | Attending: Radiation Oncology | Admitting: Radiation Oncology

## 2021-11-12 VITALS — BP 132/83 | HR 93 | Temp 98.0°F | Resp 16 | Ht 70.0 in | Wt 207.0 lb

## 2021-11-12 DIAGNOSIS — Z933 Colostomy status: Secondary | ICD-10-CM | POA: Insufficient documentation

## 2021-11-12 DIAGNOSIS — C61 Malignant neoplasm of prostate: Secondary | ICD-10-CM | POA: Insufficient documentation

## 2021-11-12 DIAGNOSIS — Z905 Acquired absence of kidney: Secondary | ICD-10-CM | POA: Insufficient documentation

## 2021-11-12 NOTE — Progress Notes (Signed)
Radiation Oncology Follow up Note  Name: Patrick Pittman   Date:   11/12/2021 MRN:  [id number] DOB: June 14, 1960    This 61 y.o. 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  REFERRING PROVIDER: Theotis Burrow*  HPI: Patient is a 61 year old male originally consulted back in August 2023.  He has a Gleason 6 adenocarcinoma the prostate with 117 g prostate MRI confirmed anterior tumor in the left paramidline to apical transitional zone bulging the capsule suspicious for extracapsular extension.  No evidence of metastatic disease.  He had a laparoscopic assisted radical nephrectomy back in July.  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  He also had a neuroendocrine tumor of the appendix which has since been removed..  He had 1 of 31 lymph nodes positive for metastatic neuroendocrine tumor measuring 0.2 cm.  Patient had a right hemicolectomy with benign colonic and enteric tissue.  He is healing from that and is scheduled to have reversal of his colostomy probably sometime around January.  COMPLICATIONS OF TREATMENT: none  FOLLOW UP COMPLIANCE: keeps appointments   PHYSICAL EXAM:  BP 132/83 (BP Location: Right Arm, Patient Position: Sitting, Cuff Size: Normal)   Pulse 93   Temp 98 F (36.7 C) (Tympanic)   Resp 16   Ht '5\' 10"'$  (1.778 m)   Wt 207 lb (93.9 kg)   BMI 29.70 kg/m  Patient has flunked functioning colostomy.  Well-developed well-nourished patient in NAD. HEENT reveals PERLA, EOMI, discs not visualized.  Oral cavity is clear. No oral mucosal lesions are identified. Neck is clear without evidence of cervical or supraclavicular adenopathy. Lungs are clear to A&P. Cardiac examination is essentially unremarkable with regular rate and rhythm without murmur rub or thrill. Abdomen is benign with no organomegaly or masses noted.  Motor sensory and DTR levels are equal and symmetric in the upper and lower extremities. Cranial nerves II through XII are grossly intact. Proprioception is intact. No peripheral adenopathy or edema is identified. No motor or sensory levels are noted. Crude visual fields are within normal range.  RADIOLOGY RESULTS: No current films for review  PLAN: This time elected ahead with radiation therapy to his prostate.  We will see him back in February after he has complete reversal of his colostomy.  At that time we will asked Dr. Erlene Quan to place fiducial markers for daily image guided IMRT radiation.  We will probably also have him have 1 shot of Eligard around the time of initiation of radiation.  Risks and benefits of treatment including increased lower urinary tract symptoms diarrhea fatigue all were reviewed in detail with the patient.  He comprehends her recommendations well.  Follow-up appointment for February was made.  Patient is to call with any concerns.  I would like to take this opportunity to thank you for allowing me to participate in the care of your patient.Noreene Filbert, MD

## 2021-11-23 ENCOUNTER — Telehealth: Payer: Self-pay | Admitting: *Deleted

## 2021-11-23 ENCOUNTER — Telehealth: Payer: Self-pay

## 2021-11-23 NOTE — Telephone Encounter (Signed)
Patient would like to come pick up a note stating that he can return to work on Friday 11/30/21 with no restrictions. Patient had surgery on 10/30/21 Dr Hampton Abbot Colon Resection right Diverting Ileostomy and partial colectomy; sigmoid Colectomy.  Please call patient when ready

## 2021-11-27 NOTE — Telephone Encounter (Signed)
Error

## 2021-11-28 ENCOUNTER — Ambulatory Visit (INDEPENDENT_AMBULATORY_CARE_PROVIDER_SITE_OTHER): Payer: BC Managed Care – PPO | Admitting: Surgery

## 2021-11-28 ENCOUNTER — Encounter: Payer: Self-pay | Admitting: Surgery

## 2021-11-28 VITALS — BP 155/89 | HR 86 | Temp 97.9°F | Ht 70.0 in | Wt 192.6 lb

## 2021-11-28 DIAGNOSIS — Z09 Encounter for follow-up examination after completed treatment for conditions other than malignant neoplasm: Secondary | ICD-10-CM

## 2021-11-28 DIAGNOSIS — C7A8 Other malignant neuroendocrine tumors: Secondary | ICD-10-CM

## 2021-11-28 DIAGNOSIS — Z08 Encounter for follow-up examination after completed treatment for malignant neoplasm: Secondary | ICD-10-CM

## 2021-11-28 NOTE — Progress Notes (Signed)
11/28/2021  HPI: Patrick Pittman is a 61 y.o. 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  During surgery, the colorectal anastomosis had a leak which was then repaired, and as a precaution, a loop ileostomy was created.  He presents today for follow up.  Reports his main issue is fatigue.  Denies any abdominal pain.  Vital signs: BP (!) 155/89   Pulse 86   Temp 97.9 F (36.6 C) (Oral)   Ht '5\' 10"'$  (1.778 m)   Wt 192 lb 9.6 oz (87.4 kg)   SpO2 98%   BMI 27.64 kg/m    Physical Exam: Constitutional:  No acute distress Abdomen:  soft, non-distended, non-tender to palpation.  Midline incision is healing well, with one small area of open wound superficially, towards the bottom of the wound.  Dressed with dry gauze dressing.  Ileostomy with stool in bag, patent/pink.  Assessment/Plan: This is a 61 y.o. male s/p open right colectomy, sigmoidectomy, and diverting loop ileostomy.  --Discussed with patient that fatigue is not uncommon after surgery, and he's had two major surgeries between July and October.  Recommended that he increase his protein intake to help with more energy. --Will order a contrast enema study to evaluate the colorectal anastomosis to make sure it's healed well without any leaks or strictures.  This will be scheduled for towards the end of December 2023.  Then he will follow up with me early January 2024 and we will schedule him for reversal of the ileostomy pending normal contrast study.   [name], [facility]

## 2021-11-28 NOTE — Patient Instructions (Addendum)
Your contrast enema test is scheduled for 01/08/2021 at 11 am (arrive by 10:30 am) @ [facility].   If you have any concerns or questions, please feel free to call our office. See follow up appointment below.   Fatigue If you have fatigue, you feel tired all the time and have a lack of energy or a lack of motivation. Fatigue may make it difficult to start or complete tasks because of exhaustion. Occasional or mild fatigue is often a normal response to activity or life. However, long-term (chronic) or extreme fatigue may be a symptom of a medical condition such as: Depression. Not having enough red blood cells or hemoglobin in the blood (anemia). A problem with a small gland located in the lower front part of the neck (thyroid disorder). Rheumatologic conditions. These are problems related to the body's defense system (immune system). Infections, especially certain viral infections. Fatigue can also lead to negative health outcomes over time. Follow these instructions at home: Medicines Take over-the-counter and prescription medicines only as told by your health care provider. Take a multivitamin if told by your health care provider. Do not use herbal or dietary supplements unless they are approved by your health care provider. Eating and drinking  Avoid heavy meals in the evening. Eat a well-balanced diet, which includes lean proteins, whole grains, plenty of fruits and vegetables, and low-fat dairy products. Avoid eating or drinking too many products with caffeine in them. Avoid alcohol. Drink enough fluid to keep your urine pale yellow. Activity  Exercise regularly, as told by your health care provider. Use or practice techniques to help you relax, such as yoga, tai chi, meditation, or massage therapy. Lifestyle Change situations that cause you stress. Try to keep your work and personal schedules in balance. Do not use recreational or illegal drugs. General  instructions Monitor your fatigue for any changes. Go to bed and get up at the same time every day. Avoid fatigue by pacing yourself during the day and getting enough sleep at night. Maintain a healthy weight. Contact a health care provider if: Your fatigue does not get better. You have a fever. You suddenly lose or gain weight. You have headaches. You have trouble falling asleep or sleeping through the night. You feel angry, guilty, anxious, or sad. You have swelling in your legs or another part of your body. Get help right away if: You feel confused, feel like you might faint, or faint. Your vision is blurry or you have a severe headache. You have severe pain in your abdomen, your back, or the area between your waist and hips (pelvis). You have chest pain, shortness of breath, or an irregular or fast heartbeat. You are unable to urinate, or you urinate less than normal. You have abnormal bleeding from the rectum, nose, lungs, nipples, or, if you are male, the vagina. You vomit blood. You have thoughts about hurting yourself or others. These symptoms may be an emergency. Get help right away. Call 911. Do not wait to see if the symptoms will go away. Do not drive yourself to the hospital. Get help right away if you feel like you may hurt yourself or others, or have thoughts about taking your own life. Go to your nearest emergency room or: Call 911. Call the [facility] at [phone] or 988. This is open 24 hours a day. Text the Crisis Text Line at (910)259-5485. Summary If you have fatigue, you feel tired all the time and have a lack  of energy or a lack of motivation. Fatigue may make it difficult to start or complete tasks because of exhaustion. Long-term (chronic) or extreme fatigue may be a symptom of a medical condition. Exercise regularly, as told by your health care provider. Change situations that cause you stress. Try to keep your work and personal  schedules in balance. This information is not intended to replace advice given to you by your health care provider. Make sure you discuss any questions you have with your health care provider. Document Revised: 10/16/2020 Document Reviewed: 10/16/2020 Elsevier Patient Education  [place].

## 2021-12-04 ENCOUNTER — Ambulatory Visit
Admission: RE | Admit: 2021-12-04 | Discharge: 2021-12-04 | Disposition: A | Discharge status: Home / Self Care | Payer: BC Managed Care – PPO | Source: Ambulatory Visit | Attending: Urology | Admitting: Urology

## 2021-12-04 DIAGNOSIS — Z905 Acquired absence of kidney: Secondary | ICD-10-CM | POA: Diagnosis present

## 2021-12-05 ENCOUNTER — Ambulatory Visit: Payer: BC Managed Care – PPO | Admitting: Oncology

## 2021-12-11 ENCOUNTER — Inpatient Hospital Stay: Payer: BC Managed Care – PPO | Attending: Oncology | Admitting: Oncology

## 2022-01-03 ENCOUNTER — Telehealth: Payer: Self-pay

## 2022-01-03 ENCOUNTER — Other Ambulatory Visit: Payer: Self-pay

## 2022-01-03 MED ORDER — NEOMYCIN SULFATE 500 MG PO TABS: ORAL_TABLET | ORAL | 0 refills | Status: DC

## 2022-01-03 MED ORDER — METRONIDAZOLE 500 MG PO TABS: ORAL_TABLET | ORAL | 0 refills | Status: DC

## 2022-01-03 MED ORDER — POLYETHYLENE GLYCOL 3350 17 GM/SCOOP PO POWD: 1.0000 | Freq: Once | ORAL | 0 refills | Status: AC

## 2022-01-03 MED ORDER — BISACODYL 5 MG PO TBEC: DELAYED_RELEASE_TABLET | ORAL | 0 refills | Status: DC

## 2022-01-03 NOTE — Telephone Encounter (Signed)
Patient called office at this time-he stated Radiology advised him to call our office for prep kit prior to having his study done.  Patient does not need any prep.

## 2022-01-08 ENCOUNTER — Ambulatory Visit
Admission: RE | Admit: 2022-01-08 | Discharge: 2022-01-08 | Disposition: A | Discharge status: Home / Self Care | Payer: BC Managed Care – PPO | Source: Ambulatory Visit | Attending: Surgery | Admitting: Surgery

## 2022-01-08 DIAGNOSIS — Z09 Encounter for follow-up examination after completed treatment for conditions other than malignant neoplasm: Secondary | ICD-10-CM

## 2022-01-08 MED ORDER — IOHEXOL 300 MG/ML  SOLN
600.0000 mL | Freq: Once | INTRAMUSCULAR | Status: AC | PRN
  Administered 2022-01-08: 600 mL

## 2022-01-14 ENCOUNTER — Telehealth: Payer: Self-pay | Admitting: Surgery

## 2022-01-14 ENCOUNTER — Ambulatory Visit (INDEPENDENT_AMBULATORY_CARE_PROVIDER_SITE_OTHER): Payer: BC Managed Care – PPO | Admitting: Surgery

## 2022-01-14 ENCOUNTER — Encounter: Payer: Self-pay | Admitting: Surgery

## 2022-01-14 VITALS — BP 127/81 | HR 68 | Temp 98.1°F | Ht 70.0 in | Wt 198.0 lb

## 2022-01-14 DIAGNOSIS — Z08 Encounter for follow-up examination after completed treatment for malignant neoplasm: Secondary | ICD-10-CM

## 2022-01-14 DIAGNOSIS — C7A8 Other malignant neuroendocrine tumors: Secondary | ICD-10-CM

## 2022-01-14 DIAGNOSIS — Z09 Encounter for follow-up examination after completed treatment for conditions other than malignant neoplasm: Secondary | ICD-10-CM

## 2022-01-14 DIAGNOSIS — Z932 Ileostomy status: Secondary | ICD-10-CM

## 2022-01-14 NOTE — Telephone Encounter (Signed)
Left message for patient to call, please inform him of the following regarding scheduled surgery with Dr. Hampton Abbot.    Pre-Admission date/time, and Surgery date at [facility].  Surgery Date: 01/29/22 Preadmission Testing Date: 01/21/22 (phone 8a-1p)  Also patient will need to call at [phone], between 1-3:00pm the day before surgery, to find out what time to arrive for surgery.

## 2022-01-14 NOTE — H&P (View-Only) (Signed)
01/14/2022  History of Present Illness: Patrick Pittman is a 62 y.o. 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  The patient presents for follow up and to schedule ileostomy reversal.  He has a contrast enema study on 01/08/22 which showed no leak or stricture of the colorectal anastomosis.  He reports that he's doing well, denies any pain, nausea, or vomiting.  He's ready for surgery.  Past Medical History: Past Medical History:  Diagnosis Date   Cancer [facility])    prostate   History of hiatal hernia    Hypertension    No blood products    Jehovah's witness     Past Surgical History: Past Surgical History:  Procedure Laterality Date   COLONOSCOPY     COLONOSCOPY WITH PROPOFOL N/A 05/23/2021   Procedure: COLONOSCOPY WITH PROPOFOL;  Surgeon: [name], MD;  Location: [facility] ENDOSCOPY;  Service: Gastroenterology;  Laterality: N/A;   COLOSTOMY REVISION N/A 10/30/2021   Procedure: COLON RESECTION RIGHT; DIVERTING ILEOSTOMY, Edison Simon PAC to assist;  Surgeon: Olean Ree, MD;  Location: [facility] ORS;  Service: General;  Laterality: N/A;   CYSTOSCOPY WITH STENT PLACEMENT Right 10/30/2021   Procedure: CYSTOSCOPY WITH URETRAL  STENT PLACEMENT;  Surgeon: Hollice Espy, MD;  Location: [facility] ORS;  Service: Urology;  Laterality: Right;   LAPAROSCOPIC APPENDECTOMY N/A 07/23/2021   Procedure: APPENDECTOMY LAPAROSCOPIC HAND ASSISTED;  Surgeon: Olean Ree, MD;  Location: [facility] ORS;  Service: General;  Laterality: N/A;   LAPAROSCOPIC NEPHRECTOMY, HAND ASSISTED Left 07/23/2021   Procedure: HAND ASSISTED LAPAROSCOPIC RADICAL NEPHRECTOMY;  Surgeon: Hollice Espy, MD;  Location: [facility] ORS;  Service: Urology;  Laterality: Left;   PARTIAL COLECTOMY N/A 10/30/2021   Procedure: PARTIAL COLECTOMY; SIGMOID COLECTOMY;  Surgeon: Olean Ree, MD;  Location: [facility] ORS;  Service: General;  Laterality: N/A;   RESECTION DISTAL CLAVICAL Right 07/26/2015   Procedure: Open excision  of right distal clavicle.;  Surgeon: Corky Mull, MD;  Location: [place];  Service: Orthopedics;  Laterality: Right;   TAKE DOWN OF INTESTINAL FISTULA  07/23/2021   Procedure: TAKE DOWN OF INTESTINAL FISTULA;  Surgeon: Olean Ree, MD;  Location: [facility] ORS;  Service: General;;    Home Medications: Prior to Admission medications   Medication Sig Start Date End Date Taking? Authorizing Provider  ibuprofen (ADVIL) 600 MG tablet Take 1 tablet (600 mg total) by mouth every 6 (six) hours as needed. 11/02/21  Yes Tylene Fantasia, PA-C  loratadine (CLARITIN) 10 MG tablet Take 10 mg by mouth daily. 05/24/21  Yes [provider]  sildenafil (REVATIO) 20 MG tablet Take 20 mg by mouth. Take 2-5 tablets by mouth as needed   Yes [provider]  valsartan-hydrochlorothiazide (DIOVAN-HCT) 80-12.5 MG tablet Take 1 tablet by mouth daily. 02/08/21  Yes [provider]    Allergies: Allergies  Allergen Reactions   Lisinopril Swelling    Review of Systems: Review of Systems  Constitutional:  Negative for chills and fever.  HENT:  Negative for hearing loss.   Respiratory:  Negative for shortness of breath.   Cardiovascular:  Negative for chest pain.  Gastrointestinal:  Negative for abdominal pain, nausea and vomiting.  Genitourinary:  Negative for dysuria.  Musculoskeletal:  Negative for myalgias.  Skin:  Negative for rash.  Neurological:  Negative for dizziness.  Psychiatric/Behavioral:  Negative for depression.     Physical Exam BP 127/81   Pulse 68   Temp 98.1 F (36.7 C)   Ht '5\' 10"'$  (1.778  m)   Wt 198 lb (89.8 kg)   SpO2 97%   BMI 28.41 kg/m  CONSTITUTIONAL: No acute distress, well nourished. HEENT:  Normocephalic, atraumatic, extraocular motion intact. NECK:  trachea is midline, no jugular venous distention RESPIRATORY:  Lungs are clear, and breath sounds are equal bilaterally. Normal respiratory effort without pathologic use of accessory  muscles. CARDIOVASCULAR: Heart is regular without murmurs, gallops, or rubs. GI: The abdomen is soft, non-distended, non-tender to palpation.  RLQ loop ileostomy is pink/patent, with liquid stool in bag.  Midline incision is well healed, without hernia formation.  MUSCULOSKELETAL:  No peripheral edema, normal gait. NEUROLOGIC:  Motor and sensation is grossly normal.  Cranial nerves are grossly intact. PSYCH:  Alert and oriented to person, place and time. Affect is normal.  Labs/Imaging: Contrast enema on 01/08/22: IMPRESSION: 1. No evidence of contrast leak status post ileocolonic anastomosis and sigmoid colon resection with anastomosis. Contrast flowed freely into the ileostomy bag. 2. No mucosal abnormalities are identified in the colon. Mild colonic diverticulosis.  Assessment and Plan: This is a 62 y.o. male s/p right colectomy and sigmoidectomy with diverting loop ileostomy  --Patient has been doing well after his surgery and has not had any complications.  His contrast enema shows no leaks at the colorectal anastomosis and no strictures.   --Discussed with the patient that we can proceed with ileostomy reversal.  He's in agreement.  Reviewed the surgery at length with him including the planned incision, the risks of bleeding, infection, injury to surrounding structures, the possibility for laparotomy incision, hospital stay, activity restrictions, post-op pain control, and he's willing to proceed. --Will schedule him for surgery on 01/29/22.  All of his questions have been answered.  I spent 40 minutes dedicated to the care of this patient on the date of this encounter to include pre-visit review of records, face-to-face time with the patient discussing diagnosis and management, and any post-visit coordination of care.   [name], [facility]

## 2022-01-14 NOTE — Progress Notes (Signed)
01/14/2022  History of Present Illness: Patrick Pittman is a 62 y.o. 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  The patient presents for follow up and to schedule ileostomy reversal.  He has a contrast enema study on 01/08/22 which showed no leak or stricture of the colorectal anastomosis.  He reports that he's doing well, denies any pain, nausea, or vomiting.  He's ready for surgery.  Past Medical History: Past Medical History:  Diagnosis Date   Cancer [facility])    prostate   History of hiatal hernia    Hypertension    No blood products    Jehovah's witness     Past Surgical History: Past Surgical History:  Procedure Laterality Date   COLONOSCOPY     COLONOSCOPY WITH PROPOFOL N/A 05/23/2021   Procedure: COLONOSCOPY WITH PROPOFOL;  Surgeon: [name], MD;  Location: [facility]COPY;  Service: Gastroenterology;  Laterality: N/A;   COLOSTOMY REVISION N/A 10/30/2021   Procedure: COLON RESECTION RIGHT; DIVERTING ILEOSTOMY, Edison Simon PAC to assist;  Surgeon: Olean Ree, MD;  Location: [facility] ORS;  Service: General;  Laterality: N/A;   CYSTOSCOPY WITH STENT PLACEMENT Right 10/30/2021   Procedure: CYSTOSCOPY WITH URETRAL  STENT PLACEMENT;  Surgeon: Hollice Espy, MD;  Location: [facility] ORS;  Service: Urology;  Laterality: Right;   LAPAROSCOPIC APPENDECTOMY N/A 07/23/2021   Procedure: APPENDECTOMY LAPAROSCOPIC HAND ASSISTED;  Surgeon: Olean Ree, MD;  Location: [facility] ORS;  Service: General;  Laterality: N/A;   LAPAROSCOPIC NEPHRECTOMY, HAND ASSISTED Left 07/23/2021   Procedure: HAND ASSISTED LAPAROSCOPIC RADICAL NEPHRECTOMY;  Surgeon: Hollice Espy, MD;  Location: [facility] ORS;  Service: Urology;  Laterality: Left;   PARTIAL COLECTOMY N/A 10/30/2021   Procedure: PARTIAL COLECTOMY; SIGMOID COLECTOMY;  Surgeon: Olean Ree, MD;  Location: [facility] ORS;  Service: General;  Laterality: N/A;   RESECTION DISTAL CLAVICAL Right 07/26/2015   Procedure: Open excision  of right distal clavicle.;  Surgeon: Corky Mull, MD;  Location: [place];  Service: Orthopedics;  Laterality: Right;   TAKE DOWN OF INTESTINAL FISTULA  07/23/2021   Procedure: TAKE DOWN OF INTESTINAL FISTULA;  Surgeon: Olean Ree, MD;  Location: [facility] ORS;  Service: General;;    Home Medications: Prior to Admission medications   Medication Sig Start Date End Date Taking? Authorizing Provider  ibuprofen (ADVIL) 600 MG tablet Take 1 tablet (600 mg total) by mouth every 6 (six) hours as needed. 11/02/21  Yes Tylene Fantasia, PA-C  loratadine (CLARITIN) 10 MG tablet Take 10 mg by mouth daily. 05/24/21  Yes [provider]  sildenafil (REVATIO) 20 MG tablet Take 20 mg by mouth. Take 2-5 tablets by mouth as needed   Yes [provider]  valsartan-hydrochlorothiazide (DIOVAN-HCT) 80-12.5 MG tablet Take 1 tablet by mouth daily. 02/08/21  Yes [provider]    Allergies: Allergies  Allergen Reactions   Lisinopril Swelling    Review of Systems: Review of Systems  Constitutional:  Negative for chills and fever.  HENT:  Negative for hearing loss.   Respiratory:  Negative for shortness of breath.   Cardiovascular:  Negative for chest pain.  Gastrointestinal:  Negative for abdominal pain, nausea and vomiting.  Genitourinary:  Negative for dysuria.  Musculoskeletal:  Negative for myalgias.  Skin:  Negative for rash.  Neurological:  Negative for dizziness.  Psychiatric/Behavioral:  Negative for depression.     Physical Exam BP 127/81   Pulse 68   Temp 98.1 F (36.7 C)   Ht '5\' 10"'$  (1.778  m)   Wt 198 lb (89.8 kg)   SpO2 97%   BMI 28.41 kg/m  CONSTITUTIONAL: No acute distress, well nourished. HEENT:  Normocephalic, atraumatic, extraocular motion intact. NECK:  trachea is midline, no jugular venous distention RESPIRATORY:  Lungs are clear, and breath sounds are equal bilaterally. Normal respiratory effort without pathologic use of accessory  muscles. CARDIOVASCULAR: Heart is regular without murmurs, gallops, or rubs. GI: The abdomen is soft, non-distended, non-tender to palpation.  RLQ loop ileostomy is pink/patent, with liquid stool in bag.  Midline incision is well healed, without hernia formation.  MUSCULOSKELETAL:  No peripheral edema, normal gait. NEUROLOGIC:  Motor and sensation is grossly normal.  Cranial nerves are grossly intact. PSYCH:  Alert and oriented to person, place and time. Affect is normal.  Labs/Imaging: Contrast enema on 01/08/22: IMPRESSION: 1. No evidence of contrast leak status post ileocolonic anastomosis and sigmoid colon resection with anastomosis. Contrast flowed freely into the ileostomy bag. 2. No mucosal abnormalities are identified in the colon. Mild colonic diverticulosis.  Assessment and Plan: This is a 61 y.o. male s/p right colectomy and sigmoidectomy with diverting loop ileostomy  --Patient has been doing well after his surgery and has not had any complications.  His contrast enema shows no leaks at the colorectal anastomosis and no strictures.   --Discussed with the patient that we can proceed with ileostomy reversal.  He's in agreement.  Reviewed the surgery at length with him including the planned incision, the risks of bleeding, infection, injury to surrounding structures, the possibility for laparotomy incision, hospital stay, activity restrictions, post-op pain control, and he's willing to proceed. --Will schedule him for surgery on 01/29/22.  All of his questions have been answered.  I spent 40 minutes dedicated to the care of this patient on the date of this encounter to include pre-visit review of records, face-to-face time with the patient discussing diagnosis and management, and any post-visit coordination of care.   Patrick Pittman, [facility]

## 2022-01-14 NOTE — Patient Instructions (Addendum)
Our surgery scheduler will call you to go over surgery information.  We have scheduled your surgery for 01/29/22. You will need to be on a clear liquid diet the day before surgery.   Dr Gary Fleet office should call you to schedule an appointment.  Ileostomy Reversal An ileostomy reversal is a procedure that reverses a temporary ileostomy. The small intestine is disconnected from the opening (stoma) in the abdomen. It is then reconnected to the rest of the intestine inside the body. After this procedure, a stoma and ostomy bag are no longer needed, and bowel movements can pass through the intestines and the rectum (bowel). Ileostomy reversal surgery is usually done through the stoma. The surgeon may need to make an additional incision, but this is rare. Tell a health care provider about: Any allergies you have. All medicines you are taking, including vitamins, herbs, eye drops, creams, and over-the-counter medicines. Any problems you or family members have had with anesthetic medicines. Any bleeding problems you have. Any surgeries you have had. Any medical conditions you have or have had. Whether you are pregnant or may be pregnant. What are the risks? Generally, this is a safe procedure. However, problems may occur, including: Infection. Bleeding. Allergic reactions to medicines. Damage to nearby structures or organs. Narrowing of the intestine at the place where it was reconnected. Blockage of the intestine (ileus). If you do not follow your health care provider's instructions, your procedure may be delayed or canceled. Medicines Ask your health care provider about: Changing or stopping your regular medicines. These include any diabetes medicines or blood thinners you take. Taking medicines such as aspirin and ibuprofen. These medicines can thin your blood. Do not take them unless your health care provider tells you to. Taking over-the-counter medicines, vitamins, herbs, and  supplements. Exams and tests You will have a physical exam, which may include a rectal exam. You may have tests, such as: Blood tests. Stool tests. X-rays. Colonoscopy. General instructions Ask your health care provider what steps will be taken to help prevent infection. These may include: Removing hair at the surgery site. Washing skin with a soap that kills germs. Taking antibiotics. Do not use any products that contain nicotine or tobacco for at least 4-6 weeks before the procedure. These products include cigarettes, chewing tobacco, and vaping devices, such as e-cigarettes. If you need help quitting, ask your health care provider. What happens during the procedure?  An IV will be inserted into one of your veins. You may be given: A sedative. This helps you relax. Anesthesia. This will make you fall asleep for surgery. One or more incisions may be made around your stoma to separate your small intestine from your skin. If there is scar tissue in your abdomen, it will be removed. The small intestine will be reconnected to the rest of the intestine inside your abdomen. Your incision may be: Closed with stitches (sutures), skin glue, or adhesive strips. Left partially open to help prevent infection while it heals. Covered with a bandage (dressing). The procedure may vary among health care providers and hospitals. What happens after the procedure? You may continue to receive fluids and medicines through an IV. You will have some pain. Medicine will be available to help you. Your blood pressure, heart rate, breathing rate, and blood oxygen level will be monitored. You may not be able to drink fluids normally or eat solid food until at least 24 hours after your procedure. You may be given ice chips to suck on until  you are able to drink fluids normally. You may have to wear compression stockings. These stockings help to prevent blood clots and reduce swelling in your legs. You will be  encouraged to: Get out of bed and start walking as soon as you are able. Do deep breathing exercises several times a day to prevent pneumonia. Summary An ileostomy reversal is a procedure that reverses a temporary ileostomy. Before the procedure, follow instructions from your health care provider about taking medicines. Before the procedure, follow instructions from your health care provider about eating and drinking. Do not use any products that contain nicotine or tobacco for at least 4-6 weeks before the procedure. These products include cigarettes, chewing tobacco, and vaping devices, such as e-cigarettes. If you need help quitting, ask your health care provider. After the procedure, you will have some pain. Medicine will be available to help you. This information is not intended to replace advice given to you by your health care provider. Make sure you discuss any questions you have with your health care provider. Document Revised: 03/06/2021 Document Reviewed: 03/06/2021 Elsevier Patient Education  [employer].

## 2022-01-15 NOTE — Telephone Encounter (Signed)
Called again, patient is now aware of all dates regarding his surgery.

## 2022-01-21 ENCOUNTER — Other Ambulatory Visit: Payer: Self-pay

## 2022-01-21 ENCOUNTER — Encounter
Admission: RE | Admit: 2022-01-21 | Discharge: 2022-01-21 | Disposition: A | Discharge status: Home / Self Care | Payer: BC Managed Care – PPO | Source: Ambulatory Visit | Attending: Surgery | Admitting: Surgery

## 2022-01-21 HISTORY — DX: Acquired absence of kidney: Z90.5

## 2022-01-21 NOTE — Patient Instructions (Addendum)
Your procedure is scheduled on: 01/29/22 - Tuesday Report to the Registration Desk on the 1st floor of the [facility]. To find out your arrival time, please call [phone] between 1PM - 3PM on: 01/28/22 - Monday If your arrival time is 6:00 am, do not arrive prior to that time as the [place] entrance doors do not open until 6:00 am.  REMEMBER: Instructions that are not followed completely may result in serious medical risk, up to and including death; or upon the discretion of your surgeon and anesthesiologist your surgery may need to be rescheduled.  Do not eat food after midnight the night before surgery.  No gum chewing, lozengers or hard candies.  You may however, drink CLEAR liquids up to 2 hours before you are scheduled to arrive for your surgery. Do not drink anything within 2 hours of your scheduled arrival time.  Clear liquids include: - water  - apple juice without pulp - gatorade (not RED colors) - black coffee or tea (Do NOT add milk or creamers to the coffee or tea) Do NOT drink anything that is not on this list.  TAKE THESE MEDICATIONS THE MORNING OF SURGERY WITH A SIP OF WATER: NONE   One week prior to surgery: Stop Anti-inflammatories (NSAIDS) such as Advil, Aleve, Ibuprofen, Motrin, Naproxen, Naprosyn and Aspirin based products such as Excedrin, Goodys Powder, BC Powder.  Stop ANY OVER THE COUNTER supplements until after surgery.  You may however, continue to take Tylenol if needed for pain up until the day of surgery.  No Alcohol for 24 hours before or after surgery.  No Smoking including e-cigarettes for 24 hours prior to surgery.  No chewable tobacco products for at least 6 hours prior to surgery.  No nicotine patches on the day of surgery.  Do not use any "recreational" drugs for at least a week prior to your surgery.  Please be advised that the combination of cocaine and anesthesia may have negative outcomes, up to and including death. If you  test positive for cocaine, your surgery will be cancelled.  On the morning of surgery brush your teeth with toothpaste and water, you may rinse your mouth with mouthwash if you wish. Do not swallow any toothpaste or mouthwash.  Use CHG Soap or wipes as directed on instruction sheet.  Do not wear jewelry, make-up, hairpins, clips or nail polish.  Do not wear lotions, powders, or perfumes.   Do not shave body from the neck down 48 hours prior to surgery just in case you cut yourself which could leave a site for infection.  Also, freshly shaved skin may become irritated if using the CHG soap.  Contact lenses, hearing aids and dentures may not be worn into surgery.  Do not bring valuables to the hospital. [facility] is not responsible for any missing/lost belongings or valuables.   Notify your doctor if there is any change in your medical condition (cold, fever, infection).  Wear comfortable clothing (specific to your surgery type) to the hospital.  After surgery, you can help prevent lung complications by doing breathing exercises.  Take deep breaths and cough every 1-2 hours. Your doctor may order a device called an Incentive Spirometer to help you take deep breaths. When coughing or sneezing, hold a pillow firmly against your incision with both hands. This is called "splinting." Doing this helps protect your incision. It also decreases belly discomfort.  If you are being admitted to the hospital overnight, leave your suitcase in the  car. After surgery it may be brought to your room.  If you are being discharged the day of surgery, you will not be allowed to drive home. You will need a responsible adult (18 years or older) to drive you home and stay with you that night.   If you are taking public transportation, you will need to have a responsible adult (18 years or older) with you. Please confirm with your physician that it is acceptable to use public transportation.   Please call  the [place] Dept. at [phone] if you have any questions about these instructions.  Surgery Visitation Policy:  Patients undergoing a surgery or procedure may have two family members or support persons with them as long as the person is not COVID-19 positive or experiencing its symptoms.   Inpatient Visitation:    Visiting hours are 7 a.m. to 8 p.m. Up to four visitors are allowed at one time in a patient room. The visitors may rotate out with other people during the day. One designated support person (adult) may remain overnight.  Due to an increase in RSV and influenza rates and associated hospitalizations, children ages 29 and under will not be able to visit patients in [facility]. Masks continue to be strongly recommended.

## 2022-01-29 ENCOUNTER — Encounter
Admission: RE | Disposition: A | Discharge status: Home / Self Care | Payer: Self-pay | Source: Home / Self Care | Attending: Surgery

## 2022-01-29 ENCOUNTER — Other Ambulatory Visit: Payer: Self-pay

## 2022-01-29 ENCOUNTER — Inpatient Hospital Stay: Payer: BC Managed Care – PPO

## 2022-01-29 ENCOUNTER — Encounter: Payer: Self-pay | Admitting: Surgery

## 2022-01-29 ENCOUNTER — Inpatient Hospital Stay
Admission: RE | Admit: 2022-01-29 | Discharge: 2022-01-31 | DRG: 331 | Disposition: A | Discharge status: Home / Self Care | Payer: BC Managed Care – PPO | Attending: Surgery | Admitting: Surgery

## 2022-01-29 DIAGNOSIS — K219 Gastro-esophageal reflux disease without esophagitis: Secondary | ICD-10-CM | POA: Diagnosis present

## 2022-01-29 DIAGNOSIS — Z932 Ileostomy status: Principal | ICD-10-CM

## 2022-01-29 DIAGNOSIS — Z8509 Personal history of malignant neoplasm of other digestive organs: Secondary | ICD-10-CM

## 2022-01-29 DIAGNOSIS — D72829 Elevated white blood cell count, unspecified: Secondary | ICD-10-CM | POA: Diagnosis not present

## 2022-01-29 DIAGNOSIS — C7A8 Other malignant neuroendocrine tumors: Secondary | ICD-10-CM

## 2022-01-29 DIAGNOSIS — Z432 Encounter for attention to ileostomy: Principal | ICD-10-CM

## 2022-01-29 DIAGNOSIS — E876 Hypokalemia: Secondary | ICD-10-CM | POA: Diagnosis present

## 2022-01-29 DIAGNOSIS — I10 Essential (primary) hypertension: Secondary | ICD-10-CM | POA: Diagnosis present

## 2022-01-29 DIAGNOSIS — Z79899 Other long term (current) drug therapy: Secondary | ICD-10-CM

## 2022-01-29 DIAGNOSIS — Z888 Allergy status to other drugs, medicaments and biological substances status: Secondary | ICD-10-CM

## 2022-01-29 DIAGNOSIS — Z8546 Personal history of malignant neoplasm of prostate: Secondary | ICD-10-CM | POA: Diagnosis not present

## 2022-01-29 DIAGNOSIS — Z905 Acquired absence of kidney: Secondary | ICD-10-CM | POA: Diagnosis not present

## 2022-01-29 DIAGNOSIS — Z433 Encounter for attention to colostomy: Secondary | ICD-10-CM | POA: Diagnosis not present

## 2022-01-29 HISTORY — PX: ILEOSTOMY CLOSURE: SHX1784

## 2022-01-29 SURGERY — CLOSURE, ILEOSTOMY
Anesthesia: General

## 2022-01-29 MED ORDER — LACTATED RINGERS IV SOLN: INTRAVENOUS | Status: DC

## 2022-01-29 MED ORDER — ROCURONIUM BROMIDE 10 MG/ML (PF) SYRINGE
PREFILLED_SYRINGE | INTRAVENOUS | Status: AC
  Filled 2022-01-29: qty 10

## 2022-01-29 MED ORDER — SODIUM CHLORIDE 0.9 % IV SOLN
2.0000 g | Freq: Three times a day (TID) | INTRAVENOUS | Status: AC
  Administered 2022-01-29 – 2022-01-30 (×3): 2 g via INTRAVENOUS
  Filled 2022-01-29 (×3): qty 2

## 2022-01-29 MED ORDER — FENTANYL CITRATE (PF) 100 MCG/2ML IJ SOLN
INTRAMUSCULAR | Status: AC
  Administered 2022-01-29: 25 ug via INTRAVENOUS
  Filled 2022-01-29: qty 2

## 2022-01-29 MED ORDER — OXYCODONE HCL 5 MG PO TABS
ORAL_TABLET | ORAL | Status: AC
  Filled 2022-01-29: qty 1

## 2022-01-29 MED ORDER — PROPOFOL 10 MG/ML IV BOLUS
INTRAVENOUS | Status: DC | PRN
  Administered 2022-01-29: 150 mg via INTRAVENOUS

## 2022-01-29 MED ORDER — LIDOCAINE HCL (CARDIAC) PF 100 MG/5ML IV SOSY
PREFILLED_SYRINGE | INTRAVENOUS | Status: DC | PRN
  Administered 2022-01-29: 100 mg via INTRAVENOUS

## 2022-01-29 MED ORDER — IRBESARTAN 150 MG PO TABS
75.0000 mg | ORAL_TABLET | Freq: Every day | ORAL | Status: DC
  Administered 2022-01-29 – 2022-01-31 (×3): 75 mg via ORAL
  Filled 2022-01-29 (×3): qty 1

## 2022-01-29 MED ORDER — PHENYLEPHRINE 80 MCG/ML (10ML) SYRINGE FOR IV PUSH (FOR BLOOD PRESSURE SUPPORT)
PREFILLED_SYRINGE | INTRAVENOUS | Status: AC
  Filled 2022-01-29: qty 20

## 2022-01-29 MED ORDER — CHLORHEXIDINE GLUCONATE CLOTH 2 % EX PADS: 6.0000 | MEDICATED_PAD | Freq: Once | CUTANEOUS | Status: DC

## 2022-01-29 MED ORDER — HYDROCHLOROTHIAZIDE 12.5 MG PO TABS
12.5000 mg | ORAL_TABLET | Freq: Every day | ORAL | Status: DC
  Administered 2022-01-29 – 2022-01-31 (×3): 12.5 mg via ORAL
  Filled 2022-01-29 (×3): qty 1

## 2022-01-29 MED ORDER — BUPIVACAINE-EPINEPHRINE (PF) 0.5% -1:200000 IJ SOLN
INTRAMUSCULAR | Status: DC | PRN
  Administered 2022-01-29: 50 mL via INTRAMUSCULAR

## 2022-01-29 MED ORDER — BUPIVACAINE-EPINEPHRINE (PF) 0.5% -1:200000 IJ SOLN
INTRAMUSCULAR | Status: AC
  Filled 2022-01-29: qty 30

## 2022-01-29 MED ORDER — KETAMINE HCL 10 MG/ML IJ SOLN
INTRAMUSCULAR | Status: DC | PRN
  Administered 2022-01-29: 20 mg via INTRAVENOUS
  Administered 2022-01-29: 10 mg via INTRAVENOUS

## 2022-01-29 MED ORDER — MIDAZOLAM HCL 2 MG/2ML IJ SOLN
INTRAMUSCULAR | Status: DC | PRN
  Administered 2022-01-29: 2 mg via INTRAVENOUS

## 2022-01-29 MED ORDER — ROCURONIUM BROMIDE 100 MG/10ML IV SOLN
INTRAVENOUS | Status: DC | PRN
  Administered 2022-01-29: 50 mg via INTRAVENOUS
  Administered 2022-01-29: 20 mg via INTRAVENOUS

## 2022-01-29 MED ORDER — GLYCOPYRROLATE 0.2 MG/ML IJ SOLN
INTRAMUSCULAR | Status: AC
  Filled 2022-01-29: qty 1

## 2022-01-29 MED ORDER — CHLORHEXIDINE GLUCONATE 0.12 % MT SOLN
OROMUCOSAL | Status: AC
  Administered 2022-01-29: 15 mL via OROMUCOSAL
  Filled 2022-01-29: qty 15

## 2022-01-29 MED ORDER — OXYCODONE HCL 5 MG PO TABS
5.0000 mg | ORAL_TABLET | ORAL | Status: DC | PRN
  Administered 2022-01-29: 10 mg via ORAL
  Filled 2022-01-29 (×2): qty 2

## 2022-01-29 MED ORDER — HYDROMORPHONE HCL 1 MG/ML IJ SOLN: 0.5000 mg | INTRAMUSCULAR | Status: DC | PRN

## 2022-01-29 MED ORDER — SODIUM CHLORIDE 0.9 % IV SOLN: INTRAVENOUS | Status: DC

## 2022-01-29 MED ORDER — PROPOFOL 10 MG/ML IV BOLUS
INTRAVENOUS | Status: AC
  Filled 2022-01-29: qty 40

## 2022-01-29 MED ORDER — FAMOTIDINE 20 MG PO TABS: 20.0000 mg | ORAL_TABLET | Freq: Once | ORAL | Status: AC

## 2022-01-29 MED ORDER — ONDANSETRON HCL 4 MG/2ML IJ SOLN
4.0000 mg | Freq: Four times a day (QID) | INTRAMUSCULAR | Status: DC | PRN
  Administered 2022-01-29: 4 mg via INTRAVENOUS
  Filled 2022-01-29: qty 2

## 2022-01-29 MED ORDER — OXYCODONE HCL 5 MG/5ML PO SOLN: 5.0000 mg | Freq: Once | ORAL | Status: DC | PRN

## 2022-01-29 MED ORDER — ADULT MULTIVITAMIN W/MINERALS CH
1.0000 | ORAL_TABLET | Freq: Every day | ORAL | Status: DC
  Administered 2022-01-30 – 2022-01-31 (×2): 1 via ORAL
  Filled 2022-01-29 (×3): qty 1

## 2022-01-29 MED ORDER — MIDAZOLAM HCL 2 MG/2ML IJ SOLN
INTRAMUSCULAR | Status: AC
  Filled 2022-01-29: qty 2

## 2022-01-29 MED ORDER — 0.9 % SODIUM CHLORIDE (POUR BTL) OPTIME
TOPICAL | Status: DC | PRN
  Administered 2022-01-29: 1000 mL

## 2022-01-29 MED ORDER — ONDANSETRON HCL 4 MG/2ML IJ SOLN
INTRAMUSCULAR | Status: DC | PRN
  Administered 2022-01-29: 4 mg via INTRAVENOUS

## 2022-01-29 MED ORDER — FENTANYL CITRATE (PF) 100 MCG/2ML IJ SOLN: 25.0000 ug | INTRAMUSCULAR | Status: DC | PRN

## 2022-01-29 MED ORDER — SODIUM CHLORIDE 0.9 % IV SOLN
2.0000 g | INTRAVENOUS | Status: AC
  Administered 2022-01-29: 2 g via INTRAVENOUS

## 2022-01-29 MED ORDER — FENTANYL CITRATE (PF) 100 MCG/2ML IJ SOLN
INTRAMUSCULAR | Status: AC
  Filled 2022-01-29: qty 2

## 2022-01-29 MED ORDER — SODIUM CHLORIDE 0.9 % IV SOLN
INTRAVENOUS | Status: AC
  Filled 2022-01-29: qty 2

## 2022-01-29 MED ORDER — DEXAMETHASONE SODIUM PHOSPHATE 10 MG/ML IJ SOLN
INTRAMUSCULAR | Status: AC
  Filled 2022-01-29: qty 1

## 2022-01-29 MED ORDER — VALSARTAN-HYDROCHLOROTHIAZIDE 80-12.5 MG PO TABS: 1.0000 | ORAL_TABLET | Freq: Every day | ORAL | Status: DC

## 2022-01-29 MED ORDER — DROPERIDOL 2.5 MG/ML IJ SOLN
INTRAMUSCULAR | Status: AC
  Administered 2022-01-29: 0.625 mg via INTRAVENOUS
  Filled 2022-01-29: qty 2

## 2022-01-29 MED ORDER — KETAMINE HCL 50 MG/5ML IJ SOSY
PREFILLED_SYRINGE | INTRAMUSCULAR | Status: AC
  Filled 2022-01-29: qty 5

## 2022-01-29 MED ORDER — CYCLOBENZAPRINE HCL 10 MG PO TABS
5.0000 mg | ORAL_TABLET | Freq: Three times a day (TID) | ORAL | Status: DC | PRN
  Administered 2022-01-29: 5 mg via ORAL
  Filled 2022-01-29: qty 1

## 2022-01-29 MED ORDER — PHENYLEPHRINE 80 MCG/ML (10ML) SYRINGE FOR IV PUSH (FOR BLOOD PRESSURE SUPPORT)
PREFILLED_SYRINGE | INTRAVENOUS | Status: DC | PRN
  Administered 2022-01-29: 160 ug via INTRAVENOUS

## 2022-01-29 MED ORDER — DEXAMETHASONE SODIUM PHOSPHATE 10 MG/ML IJ SOLN
INTRAMUSCULAR | Status: DC | PRN
  Administered 2022-01-29: 5 mg via INTRAVENOUS

## 2022-01-29 MED ORDER — PROPOFOL 10 MG/ML IV BOLUS
INTRAVENOUS | Status: AC
  Filled 2022-01-29: qty 20

## 2022-01-29 MED ORDER — HEPARIN SODIUM (PORCINE) 5000 UNIT/ML IJ SOLN
5000.0000 [IU] | Freq: Three times a day (TID) | INTRAMUSCULAR | Status: DC
  Administered 2022-01-30 – 2022-01-31 (×4): 5000 [IU] via SUBCUTANEOUS
  Filled 2022-01-29 (×4): qty 1

## 2022-01-29 MED ORDER — ACETAMINOPHEN 500 MG PO TABS: 1000.0000 mg | ORAL_TABLET | ORAL | Status: AC

## 2022-01-29 MED ORDER — GABAPENTIN 300 MG PO CAPS: 300.0000 mg | ORAL_CAPSULE | ORAL | Status: AC

## 2022-01-29 MED ORDER — ONDANSETRON 4 MG PO TBDP: 4.0000 mg | ORAL_TABLET | ORAL | Status: DC | PRN

## 2022-01-29 MED ORDER — HYDROMORPHONE HCL 1 MG/ML IJ SOLN: 1.0000 mg | INTRAMUSCULAR | Status: DC | PRN

## 2022-01-29 MED ORDER — POLYETHYLENE GLYCOL 3350 17 G PO PACK: 17.0000 g | PACK | Freq: Every day | ORAL | Status: DC | PRN

## 2022-01-29 MED ORDER — FENTANYL CITRATE (PF) 100 MCG/2ML IJ SOLN
INTRAMUSCULAR | Status: DC | PRN
  Administered 2022-01-29: 50 ug via INTRAVENOUS
  Administered 2022-01-29: 25 ug via INTRAVENOUS
  Administered 2022-01-29: 50 ug via INTRAVENOUS
  Administered 2022-01-29: 25 ug via INTRAVENOUS

## 2022-01-29 MED ORDER — DEXMEDETOMIDINE HCL IN NACL 80 MCG/20ML IV SOLN
INTRAVENOUS | Status: AC
  Filled 2022-01-29: qty 20

## 2022-01-29 MED ORDER — PANTOPRAZOLE SODIUM 40 MG IV SOLR
40.0000 mg | Freq: Every day | INTRAVENOUS | Status: DC
  Administered 2022-01-29 – 2022-01-30 (×2): 40 mg via INTRAVENOUS
  Filled 2022-01-29 (×2): qty 10

## 2022-01-29 MED ORDER — ONDANSETRON HCL 4 MG/2ML IJ SOLN
INTRAMUSCULAR | Status: AC
  Filled 2022-01-29: qty 2

## 2022-01-29 MED ORDER — FAMOTIDINE 20 MG PO TABS
ORAL_TABLET | ORAL | Status: AC
  Administered 2022-01-29: 20 mg via ORAL
  Filled 2022-01-29: qty 1

## 2022-01-29 MED ORDER — BUPIVACAINE LIPOSOME 1.3 % IJ SUSP
INTRAMUSCULAR | Status: AC
  Filled 2022-01-29: qty 20

## 2022-01-29 MED ORDER — BUPIVACAINE LIPOSOME 1.3 % IJ SUSP: 20.0000 mL | Freq: Once | INTRAMUSCULAR | Status: DC

## 2022-01-29 MED ORDER — ACETAMINOPHEN 500 MG PO TABS
ORAL_TABLET | ORAL | Status: AC
  Administered 2022-01-29: 1000 mg via ORAL
  Filled 2022-01-29: qty 2

## 2022-01-29 MED ORDER — ONDANSETRON HCL 4 MG/2ML IJ SOLN: 4.0000 mg | Freq: Four times a day (QID) | INTRAMUSCULAR | Status: DC | PRN

## 2022-01-29 MED ORDER — ALVIMOPAN 12 MG PO CAPS
ORAL_CAPSULE | ORAL | Status: AC
  Administered 2022-01-29: 12 mg via ORAL
  Filled 2022-01-29: qty 1

## 2022-01-29 MED ORDER — ALVIMOPAN 12 MG PO CAPS
12.0000 mg | ORAL_CAPSULE | Freq: Two times a day (BID) | ORAL | Status: DC
  Filled 2022-01-29: qty 1

## 2022-01-29 MED ORDER — LORATADINE 10 MG PO TABS: 10.0000 mg | ORAL_TABLET | Freq: Every day | ORAL | Status: DC | PRN

## 2022-01-29 MED ORDER — ALVIMOPAN 12 MG PO CAPS: 12.0000 mg | ORAL_CAPSULE | ORAL | Status: AC

## 2022-01-29 MED ORDER — ACETAMINOPHEN 500 MG PO TABS
1000.0000 mg | ORAL_TABLET | Freq: Four times a day (QID) | ORAL | Status: DC
  Administered 2022-01-29 – 2022-01-31 (×5): 1000 mg via ORAL
  Filled 2022-01-29 (×6): qty 2

## 2022-01-29 MED ORDER — DROPERIDOL 2.5 MG/ML IJ SOLN: 0.6250 mg | Freq: Once | INTRAMUSCULAR | Status: AC

## 2022-01-29 MED ORDER — SODIUM CHLORIDE FLUSH 0.9 % IV SOLN
INTRAVENOUS | Status: AC
  Filled 2022-01-29: qty 10

## 2022-01-29 MED ORDER — DEXMEDETOMIDINE HCL IN NACL 80 MCG/20ML IV SOLN
INTRAVENOUS | Status: DC | PRN
  Administered 2022-01-29: 8 ug via BUCCAL
  Administered 2022-01-29: 4 ug via BUCCAL

## 2022-01-29 MED ORDER — SUGAMMADEX SODIUM 200 MG/2ML IV SOLN
INTRAVENOUS | Status: DC | PRN
  Administered 2022-01-29: 200 mg via INTRAVENOUS

## 2022-01-29 MED ORDER — GABAPENTIN 300 MG PO CAPS
ORAL_CAPSULE | ORAL | Status: AC
  Administered 2022-01-29: 300 mg via ORAL
  Filled 2022-01-29: qty 1

## 2022-01-29 MED ORDER — OXYCODONE HCL 5 MG PO TABS: 5.0000 mg | ORAL_TABLET | Freq: Once | ORAL | Status: DC | PRN

## 2022-01-29 MED ORDER — CHLORHEXIDINE GLUCONATE 0.12 % MT SOLN: 15.0000 mL | Freq: Once | OROMUCOSAL | Status: AC

## 2022-01-29 MED ORDER — SEVOFLURANE IN SOLN
RESPIRATORY_TRACT | Status: AC
  Filled 2022-01-29: qty 250

## 2022-01-29 MED ORDER — LIDOCAINE HCL (PF) 2 % IJ SOLN
INTRAMUSCULAR | Status: AC
  Filled 2022-01-29: qty 5

## 2022-01-29 MED ORDER — ONDANSETRON 4 MG PO TBDP: 4.0000 mg | ORAL_TABLET | Freq: Four times a day (QID) | ORAL | Status: DC | PRN

## 2022-01-29 MED ORDER — ORAL CARE MOUTH RINSE: 15.0000 mL | Freq: Once | OROMUCOSAL | Status: AC

## 2022-01-29 SURGICAL SUPPLY — 53 items
CHLORAPREP W/TINT 26 (MISCELLANEOUS) ×1 IMPLANT
DRAIN PENROSE 12X.25 LTX STRL (MISCELLANEOUS) IMPLANT
DRAPE LAPAROTOMY 100X77 ABD (DRAPES) ×1 IMPLANT
DRSG OPSITE POSTOP 4X10 (GAUZE/BANDAGES/DRESSINGS) IMPLANT
DRSG OPSITE POSTOP 4X8 (GAUZE/BANDAGES/DRESSINGS) IMPLANT
ELECT CAUTERY BLADE 6.4 (BLADE) ×1 IMPLANT
ELECT CAUTERY BLADE TIP 2.5 (TIP)
ELECT REM PT RETURN 9FT ADLT (ELECTROSURGICAL) ×1
ELECTRODE CAUTERY BLDE TIP 2.5 (TIP) ×1 IMPLANT
ELECTRODE REM PT RTRN 9FT ADLT (ELECTROSURGICAL) ×1 IMPLANT
GAUZE 4X4 16PLY ~~LOC~~+RFID DBL (SPONGE) ×1 IMPLANT
GAUZE SPONGE 4X4 12PLY STRL (GAUZE/BANDAGES/DRESSINGS) ×1 IMPLANT
GLOVE SURG SYN 7.0 (GLOVE) ×1 IMPLANT
GLOVE SURG SYN 7.0 PF PI (GLOVE) ×1 IMPLANT
GLOVE SURG SYN 7.5  E (GLOVE) ×1
GLOVE SURG SYN 7.5 E (GLOVE) ×1 IMPLANT
GLOVE SURG SYN 7.5 PF PI (GLOVE) ×1 IMPLANT
GOWN STRL REUS W/ TWL LRG LVL3 (GOWN DISPOSABLE) ×2 IMPLANT
GOWN STRL REUS W/ TWL XL LVL3 (GOWN DISPOSABLE) ×1 IMPLANT
GOWN STRL REUS W/TWL LRG LVL3 (GOWN DISPOSABLE) ×2
GOWN STRL REUS W/TWL XL LVL3 (GOWN DISPOSABLE) ×1
HOLDER FOLEY CATH W/STRAP (MISCELLANEOUS) ×1 IMPLANT
LABEL OR SOLS (LABEL) ×1 IMPLANT
MANIFOLD NEPTUNE II (INSTRUMENTS) ×1 IMPLANT
NEEDLE HYPO 22GX1.5 SAFETY (NEEDLE) ×1 IMPLANT
PACK BASIN MAJOR ARMC (MISCELLANEOUS) ×1 IMPLANT
PACK COLON CLEAN CLOSURE (MISCELLANEOUS) IMPLANT
PAD ABD DERMACEA PRESS 5X9 (GAUZE/BANDAGES/DRESSINGS) IMPLANT
RELOAD PROXIMATE 75MM BLUE (ENDOMECHANICALS) ×1 IMPLANT
RELOAD STAPLE 75 3.8 BLU REG (ENDOMECHANICALS) IMPLANT
SOL PREP PVP 2OZ (MISCELLANEOUS) ×1
SOLUTION PREP PVP 2OZ (MISCELLANEOUS) ×1 IMPLANT
SPONGE T-LAP 18X18 ~~LOC~~+RFID (SPONGE) ×3 IMPLANT
STAPLER PROXIMATE 55 BLUE (STAPLE) IMPLANT
STAPLER PROXIMATE 75MM BLUE (STAPLE) IMPLANT
STAPLER SKIN PROX 35W (STAPLE) ×1 IMPLANT
SURGILUBE 2OZ TUBE FLIPTOP (MISCELLANEOUS) ×1 IMPLANT
SUT PDS AB 1 CT1 36 (SUTURE) ×1 IMPLANT
SUT SILK 2 0 (SUTURE) ×1
SUT SILK 2 0 SH (SUTURE) ×1 IMPLANT
SUT SILK 2 0SH CR/8 30 (SUTURE) ×1 IMPLANT
SUT SILK 2-0 18XBRD TIE 12 (SUTURE) IMPLANT
SUT SILK 3-0 (SUTURE) IMPLANT
SUT VIC AB 2-0 SH 27 (SUTURE) ×1
SUT VIC AB 2-0 SH 27XBRD (SUTURE) IMPLANT
SUT VIC AB 3-0 SH 27 (SUTURE) ×1
SUT VIC AB 3-0 SH 27X BRD (SUTURE) IMPLANT
SYR 10ML LL (SYRINGE) ×1 IMPLANT
SYR 20ML LL LF (SYRINGE) ×2 IMPLANT
SYR BULB IRRIG 60ML STRL (SYRINGE) ×1 IMPLANT
TRAP FLUID SMOKE EVACUATOR (MISCELLANEOUS) ×1 IMPLANT
TRAY FOLEY MTR SLVR 16FR STAT (SET/KITS/TRAYS/PACK) ×1 IMPLANT
WATER STERILE IRR 500ML POUR (IV SOLUTION) ×1 IMPLANT

## 2022-01-29 NOTE — Anesthesia Procedure Notes (Cosign Needed)
Procedure Name: Intubation Date/Time: 01/29/2022 7:40 AM  Performed by: Garnette Gunner, RNPre-anesthesia Checklist: Patient identified, Emergency Drugs available, Suction available and Patient being monitored Patient Re-evaluated:Patient Re-evaluated prior to induction Oxygen Delivery Method: Circle system utilized Preoxygenation: Pre-oxygenation with 100% oxygen Induction Type: IV induction Ventilation: Mask ventilation without difficulty Laryngoscope Size: McGraph and 3 Grade View: Grade I Tube type: Oral Tube size: 7.5 mm Number of attempts: 1 Airway Equipment and Method: Stylet Placement Confirmation: ETT inserted through vocal cords under direct vision, positive ETCO2 and breath sounds checked- equal and bilateral Secured at: 23 cm Tube secured with: Tape Dental Injury: Teeth and Oropharynx as per pre-operative assessment  Comments: Intubated by [place] under CRNA and MDA supervision

## 2022-01-29 NOTE — Anesthesia Preprocedure Evaluation (Addendum)
Anesthesia Evaluation  Patient identified by MRN, date of birth, ID band Patient awake    Reviewed: Allergy & Precautions, NPO status , Patient's Chart, lab work & pertinent test results  History of Anesthesia Complications Negative for: history of anesthetic complications  Airway Mallampati: III  TM Distance: >3 FB Neck ROM: full    Dental  (+) Chipped   Pulmonary neg pulmonary ROS, neg shortness of breath   Pulmonary exam normal        Cardiovascular Exercise Tolerance: Good hypertension, (-) angina (-) Past MI Normal cardiovascular exam     Neuro/Psych negative neurological ROS  negative psych ROS   GI/Hepatic Neg liver ROS, hiatal hernia,GERD  Controlled,,  Endo/Other  negative endocrine ROS    Renal/GU Renal disease     Musculoskeletal   Abdominal   Peds  Hematology negative hematology ROS (+)   Anesthesia Other Findings Past Medical History: No date: Cancer ([place])     Comment:  prostate No date: H/O left radical nephrectomy No date: History of hiatal hernia No date: Hypertension No date: No blood products     Comment:  [employer] witness  Past Surgical History: No date: APPENDECTOMY No date: COLONOSCOPY 05/23/2021: COLONOSCOPY WITH PROPOFOL; N/A     Comment:  Procedure: COLONOSCOPY WITH PROPOFOL;  Surgeon: [name], MD;  Location: [facility] ENDOSCOPY;  Service:               Gastroenterology;  Laterality: N/A; 10/30/2021: COLOSTOMY REVISION; N/A     Comment:  Procedure: COLON RESECTION RIGHT; DIVERTING ILEOSTOMY,               Edison Simon PAC to assist;  Surgeon: Olean Ree,               MD;  Location: [facility] ORS;  Service: General;  Laterality:               N/A; 10/30/2021: CYSTOSCOPY WITH STENT PLACEMENT; Right     Comment:  Procedure: CYSTOSCOPY WITH URETRAL  STENT PLACEMENT;                Surgeon: Hollice Espy, MD;  Location: [facility] ORS;                Service: Urology;   Laterality: Right; 07/23/2021: LAPAROSCOPIC APPENDECTOMY; N/A     Comment:  Procedure: APPENDECTOMY LAPAROSCOPIC HAND ASSISTED;                Surgeon: Olean Ree, MD;  Location: [facility] ORS;                Service: General;  Laterality: N/A; 07/23/2021: LAPAROSCOPIC NEPHRECTOMY, HAND ASSISTED; Left     Comment:  Procedure: HAND ASSISTED LAPAROSCOPIC RADICAL               NEPHRECTOMY;  Surgeon: Hollice Espy, MD;  Location:               [facility] ORS;  Service: Urology;  Laterality: Left; 10/30/2021: PARTIAL COLECTOMY; N/A     Comment:  Procedure: PARTIAL COLECTOMY; SIGMOID COLECTOMY;                Surgeon: Olean Ree, MD;  Location: [facility] ORS;                Service: General;  Laterality: N/A; 07/26/2015: RESECTION DISTAL CLAVICAL; Right     Comment:  Procedure: Open excision of right distal clavicle.;  Surgeon: Corky Mull, MD;  Location: [facility];  Service: Orthopedics;  Laterality: Right; 07/23/2021: TAKE DOWN OF INTESTINAL FISTULA     Comment:  Procedure: TAKE DOWN OF INTESTINAL FISTULA;  Surgeon:               Olean Ree, MD;  Location: [facility] ORS;  Service:               General;;  BMI    Body Mass Index: 28.41 kg/m      Reproductive/Obstetrics negative OB ROS                             Anesthesia Physical Anesthesia Plan  ASA: 2  Anesthesia Plan: General ETT   Post-op Pain Management:    Induction: Intravenous  PONV Risk Score and Plan: Ondansetron, Dexamethasone, Midazolam and Treatment may vary due to age or medical condition  Airway Management Planned: Oral ETT  Additional Equipment:   Intra-op Plan:   Post-operative Plan: Extubation in OR  Informed Consent: I have reviewed the patients History and Physical, chart, labs and discussed the procedure including the risks, benefits and alternatives for the proposed anesthesia with the patient or authorized representative who has indicated  his/her understanding and acceptance.     Dental Advisory Given  Plan Discussed with: Anesthesiologist, CRNA and Surgeon  Anesthesia Plan Comments: (Patient consented for risks of anesthesia including but not limited to:  - adverse reactions to medications - damage to eyes, teeth, lips or other oral mucosa - nerve damage due to positioning  - sore throat or hoarseness - Damage to heart, brain, nerves, lungs, other parts of body or loss of life  Patient voiced understanding.)       Anesthesia Quick Evaluation

## 2022-01-29 NOTE — Anesthesia Postprocedure Evaluation (Signed)
Anesthesia Post Note  Patient: Patrick Pittman  Procedure(s) Performed: ILEOSTOMY TAKEDOWN, open  Patient location during evaluation: PACU Anesthesia Type: General Level of consciousness: awake and alert Pain management: pain level controlled Vital Signs Assessment: post-procedure vital signs reviewed and stable Respiratory status: spontaneous breathing, nonlabored ventilation, respiratory function stable and patient connected to nasal cannula oxygen Cardiovascular status: blood pressure returned to baseline and stable Postop Assessment: no apparent nausea or vomiting Anesthetic complications: no   No notable events documented.   Last Vitals:  Vitals:   01/29/22 1213 01/29/22 1245  BP: 136/86 (!) 143/84  Pulse: 85 88  Resp: 17 18  Temp: 37.1 C 37 C  SpO2: 100% 98%    Last Pain:  Vitals:   01/29/22 1245  TempSrc: Oral  PainSc:                  [name]

## 2022-01-29 NOTE — Transfer of Care (Cosign Needed)
Immediate Anesthesia Transfer of Care Note  Patient: Patrick Pittman  Procedure(s) Performed: ILEOSTOMY TAKEDOWN, open  Patient Location: PACU  Anesthesia Type:General  Level of Consciousness: drowsy  Airway & Oxygen Therapy: Patient Spontanous Breathing and Patient connected to face mask oxygen  Post-op Assessment: Report given to RN and Post -op Vital signs reviewed and stable  Post vital signs: Reviewed and stable  Last Vitals:  Vitals Value Taken Time  BP 134/86 01/29/22 1030  Temp 36.4 C 01/29/22 1029  Pulse 80 01/29/22 1033  Resp 17 01/29/22 1033  SpO2 100 % 01/29/22 1033  Vitals shown include unvalidated device data.  Last Pain:  Vitals:   01/29/22 1029  TempSrc:   PainSc: 0-No pain         Complications: No notable events documented.

## 2022-01-29 NOTE — Addendum Note (Signed)
Addendum  created 01/29/22 1801 by Adalberto Ill, CRNA   Intraprocedure Event edited

## 2022-01-29 NOTE — Interval H&P Note (Signed)
History and Physical Interval Note:  01/29/2022 7:04 AM  Patrick Pittman  has presented today for surgery, with the diagnosis of ileostomy in place.  The various methods of treatment have been discussed with the patient and family. After consideration of risks, benefits and other options for treatment, the patient has consented to  Procedure(s): ILEOSTOMY TAKEDOWN, open (N/A) as a surgical intervention.  The patient's history has been reviewed, patient examined, no change in status, stable for surgery.  I have reviewed the patient's chart and labs.  Questions were answered to the patient's satisfaction.     Sahiti Joswick

## 2022-01-29 NOTE — Op Note (Signed)
  Procedure Date:  01/29/2022  Pre-operative Diagnosis:  Loop Ileostomy in place  Post-operative Diagnosis: Loop ileostomy in place  Procedure:  Loop ileostomy closure  Surgeon:  [name], MD  Anesthesia:  General endotracheal  Estimated Blood Loss:  20 ml  Specimens:  None  Complications:  None  Indications for Procedure:  This is a 62 y.o. male with prior history of appendiceal neuroendocrine tumor, s/p prior appendectomy with subsequent right colectomy and sigmoidectomy with diverting loop ileostomy creation.  He now presents for ileostomy closure.  The risks of bleeding, abscess or infection, injury to surrounding structures, and need for further procedures were all discussed with the patient and was willing to proceed.  Description of Procedure: The patient was correctly identified in the preoperative area and brought into the operating room.  The patient was placed supine with VTE prophylaxis in place.  Appropriate time-outs were performed.  Anesthesia was induced and the patient was intubated.  Foley catheter was placed.  Appropriate antibiotics were infused.  The patient's ileostomy in the right lower quadrant was sutured closed using 2-0 Silk suture.  The abdomen was prepped and draped in a sterile fashion.  Using cautery, an incision was made at the mucocutaneous junction.  Cautery was used to then dissect the ostomy off the subcutaneous tissue and the fascia of the anterior rectus sheath, then the rectus muscle itself, and finally the posterior rectus sheath.  Blunt dissection was used to free up the bowel and mesentery off the anterior abdominal wall.  This gave [place] enough length of bowel to pull out and safely do an anastomosis.  The antimesenteric sided were lined up using 3-0 silk sutures.  Enterotomies were created at either limb of the loop ileostomy and a blue load GIA 75 mm stapler was inserted in each limb to create our common channel.  Once that was done, it was  verified that there was no bleeding from the staple line.  The common channel opening was then approximated using Alis clamps, and another load of 75 mm blue load was used to staple across, closing the enterotomy.  The staple line was then imbricated using 3-0 Silk sutures.  Once completed, the bowel was pushed back into the abdominal cavity.  50 ml of Exparel solution mixed with 0.5% bupivacaine with epi was infiltrated onto the posterior sheath, anterior sheath, and subcutaneous tissue.    We then proceeded with our clean closure.  We scrubbed again and placed new drape over the field.  The posterior sheath was closed using 2-0 Vicryl suture, the anterior sheath was closed using #1 PDS suture.  A 1/4 inch penrose drain was placed into the cavity and the wound was then closed in layers using 2-0 Vicryl, 3-0 Vicryl, and staples for the skin.  The skin was then cleaned and the incision was dressed with Honeycomb dressing.  Foley catheter was removed.  The patient was emerged from anesthesia and extubated and brought to the recovery room for further management.  The patient tolerated the procedure well and all counts were correct at the end of the case.   [name], MD

## 2022-01-30 ENCOUNTER — Encounter: Payer: Self-pay | Admitting: Surgery

## 2022-01-30 LAB — CBC
HCT: 35.3 % — ABNORMAL LOW (ref 39.0–52.0)
Hemoglobin: 11.9 g/dL — ABNORMAL LOW (ref 13.0–17.0)
MCH: 28.7 pg (ref 26.0–34.0)
MCHC: 33.7 g/dL (ref 30.0–36.0)
MCV: 85.1 fL (ref 80.0–100.0)
Platelets: 250 10*3/uL (ref 150–400)
RBC: 4.15 MIL/uL — ABNORMAL LOW (ref 4.22–5.81)
RDW: 16.4 % — ABNORMAL HIGH (ref 11.5–15.5)
WBC: 11.7 10*3/uL — ABNORMAL HIGH (ref 4.0–10.5)
nRBC: 0 % (ref 0.0–0.2)

## 2022-01-30 LAB — BASIC METABOLIC PANEL
Anion gap: 8 (ref 5–15)
BUN: 20 mg/dL (ref 6–20)
CO2: 20 mmol/L — ABNORMAL LOW (ref 22–32)
Calcium: 8.7 mg/dL — ABNORMAL LOW (ref 8.9–10.3)
Chloride: 111 mmol/L (ref 98–111)
Creatinine, Ser: 2.18 mg/dL — ABNORMAL HIGH (ref 0.61–1.24)
GFR, Estimated: 34 mL/min — ABNORMAL LOW (ref >60)
Glucose, Bld: 96 mg/dL (ref 70–99)
Potassium: 3.4 mmol/L — ABNORMAL LOW (ref 3.5–5.1)
Sodium: 139 mmol/L (ref 135–145)

## 2022-01-30 MED ORDER — POTASSIUM CHLORIDE CRYS ER 20 MEQ PO TBCR
20.0000 meq | EXTENDED_RELEASE_TABLET | Freq: Two times a day (BID) | ORAL | Status: DC
  Administered 2022-01-30 – 2022-01-31 (×3): 20 meq via ORAL
  Filled 2022-01-30 (×3): qty 1

## 2022-01-30 NOTE — Progress Notes (Signed)
[facility] Hospital Day(s): 1.   Post op day(s): 1 Day Post-Op.   Interval History:  Patient seen and examined No acute events or new complaints overnight.  Patient reports he is feeling good this morning; very thankful Did have nausea immediately post-op but this is resolved No fever, chills, emesis Mild leukocytosis this AM; 11.7K; likely reactive Hgb to 11.9; stable Renal function near baseline; sCr - 2.18; UO - 100 ccs + unmeasured x2 Mild hypokalemia to 3.4 He is on CLD; tolerating   Vital signs in last 24 hours: [min-max] current  Temp:  [97.5 F (36.4 C)-98.8 F (37.1 C)] 98 F (36.7 C) (01/24 0559) Pulse Rate:  [66-103] 66 (01/24 0559) Resp:  [16-23] 18 (01/24 0023) BP: (110-152)/(76-89) 123/79 (01/24 0559) SpO2:  [98 %-100 %] 99 % (01/24 0559)     Height: '5\' 10"'$  (177.8 cm) Weight: 89.8 kg BMI (Calculated): 28.41   Intake/Output last 2 shifts:  01/23 0701 - 01/24 0700 In: 918 [P.O.:118; I.V.:700; IV Piggyback:100] Out: 120 [Urine:100; Blood:20]   Physical Exam:  Constitutional: alert, cooperative and no distress  Respiratory: breathing non-labored at rest  Cardiovascular: regular rate and sinus rhythm  Gastrointestinal: soft, non-tender, and non-distended, no rebound/guarding Integumentary: Ileostomy takedown incision is CDI; penrose in place; minimal serosanguinous drainage from dependent portion; dressing changed.   Labs:     Latest Ref Rng & Units 01/30/2022    6:08 AM 11/02/2021    4:26 AM 11/01/2021    5:25 AM  CBC  WBC 4.0 - 10.5 K/uL 11.7  8.5  10.0   Hemoglobin 13.0 - 17.0 g/dL 11.9  12.0  13.0   Hematocrit 39.0 - 52.0 % 35.3  36.3  37.7   Platelets 150 - 400 K/uL 250  226  241       Latest Ref Rng & Units 01/30/2022    6:08 AM 11/02/2021    4:26 AM 11/01/2021    5:25 AM  CMP  Glucose 70 - 99 mg/dL 96  95  88   BUN 6 - 20 mg/dL '20  21  25   '$ Creatinine 0.61 - 1.24 mg/dL 2.18  1.89  2.12   Sodium 135 -  145 mmol/L 139  143  146   Potassium 3.5 - 5.1 mmol/L 3.4  3.4  3.9   Chloride 98 - 111 mmol/L 111  112  116   CO2 22 - 32 mmol/L '20  25  23   '$ Calcium 8.9 - 10.3 mg/dL 8.7  8.3  8.5      Imaging studies: No new pertinent imaging studies   Assessment/Plan:  62 y.o. male 1 Day Post-Op s/p ileostomy takedown   - Advance to full liquids; Okay for soft diet this afternoon if doing well   - Discontinue Entereg - Discontinue IVF   - Monitor abdominal examination; on-going bowel function - Replete K+; monitor - Pain control prn; antiemetics prn   - Morning labs - Mobilize  - Discharge Planning; Doing well, diet advancing. Plan for DC tomorrow AM   All of the above findings and recommendations were discussed with the patient, patient's family, and the medical team, and all of patient's and family's questions were answered to their expressed satisfaction.  -- Edison Simon, PA-C [facility] 01/30/2022, 7:25 AM M-F: 7am - 4pm

## 2022-01-31 LAB — CBC
HCT: 37.7 % — ABNORMAL LOW (ref 39.0–52.0)
Hemoglobin: 12.6 g/dL — ABNORMAL LOW (ref 13.0–17.0)
MCH: 28.4 pg (ref 26.0–34.0)
MCHC: 33.4 g/dL (ref 30.0–36.0)
MCV: 84.9 fL (ref 80.0–100.0)
Platelets: 248 10*3/uL (ref 150–400)
RBC: 4.44 MIL/uL (ref 4.22–5.81)
RDW: 16.3 % — ABNORMAL HIGH (ref 11.5–15.5)
WBC: 7.9 10*3/uL (ref 4.0–10.5)
nRBC: 0 % (ref 0.0–0.2)

## 2022-01-31 LAB — BASIC METABOLIC PANEL
Anion gap: 6 (ref 5–15)
BUN: 16 mg/dL (ref 6–20)
CO2: 23 mmol/L (ref 22–32)
Calcium: 9.1 mg/dL (ref 8.9–10.3)
Chloride: 110 mmol/L (ref 98–111)
Creatinine, Ser: 1.98 mg/dL — ABNORMAL HIGH (ref 0.61–1.24)
GFR, Estimated: 38 mL/min — ABNORMAL LOW (ref >60)
Glucose, Bld: 84 mg/dL (ref 70–99)
Potassium: 3.5 mmol/L (ref 3.5–5.1)
Sodium: 139 mmol/L (ref 135–145)

## 2022-01-31 MED ORDER — CYCLOBENZAPRINE HCL 5 MG PO TABS: 5.0000 mg | ORAL_TABLET | Freq: Three times a day (TID) | ORAL | 0 refills | Status: DC | PRN

## 2022-01-31 MED ORDER — OXYCODONE HCL 5 MG PO TABS: 5.0000 mg | ORAL_TABLET | Freq: Four times a day (QID) | ORAL | 0 refills | Status: DC | PRN

## 2022-01-31 NOTE — Discharge Instructions (Signed)
In addition to included general post-operative instructions,  Diet: Continue soft / mushy diet through the weekend. Okay to transition to regular diet on Monday 01/ 29  Activity: No heavy lifting >20 pounds (children, pets, laundry, garbage) or strenuous activity for 6 weeks from date of surgery, but light activity and walking are encouraged. Do not drive or drink alcohol if taking narcotic pain medications or having pain that might distract from driving.  Wound care: You may shower/get incision wet with soapy water and pat dry (do not rub incisions), but no baths or submerging incision underwater until follow-up. Recommend keeping incision covered with gauze and tape, okay to remove to shower. This may rain from time to time expectedly. We will remove drain and staples in follow up.   Medications: Resume all home medications. For mild to moderate pain: acetaminophen (Tylenol) or ibuprofen/naproxen (if no kidney disease). Combining Tylenol with alcohol can substantially increase your risk of causing liver disease. Narcotic pain medications, if prescribed, can be used for severe pain, though may cause nausea, constipation, and drowsiness. Do not combine Tylenol and Percocet (or similar) within a 6 hour period as Percocet (and similar) contain(s) Tylenol. If you do not need the narcotic pain medication, you do not need to fill the prescription.  Call office [phone] / [phone]) at any time if any questions, worsening pain, fevers/chills, bleeding, drainage from incision site, or other concerns.

## 2022-01-31 NOTE — Discharge Summary (Signed)
[facility] SURGICAL DISCHARGE SUMMARY  Patient ID: TORIEN RAMROOP MRN: [id number] DOB/AGE: 1960-03-28 62 y.o.  Admit date: 01/29/2022 Discharge date: 01/31/2022  Discharge Diagnoses Patient Active Problem List   Diagnosis Date Noted   Ileostomy in place [facility]) 01/29/2022    Consultants None  Procedures 01/29/2022: Ileostomy Takedown   HPI: Patrick Pittman is a 62 y.o. male with prior history of appendiceal neuroendocrine tumor, s/p prior appendectomy with subsequent right colectomy and sigmoidectomy with diverting loop ileostomy creation.  He now presents for ileostomy closure.   Hospital Course: Informed consent was obtained and documented, and patient underwent uneventful ileostomy takedown (Dr Hampton Abbot, 01/29/2022).  Post-operatively, patient did very well. Advancement of patient's diet and ambulation were well-tolerated. The remainder of patient's hospital course was essentially unremarkable, and discharge planning was initiated accordingly with patient safely able to be discharged home with appropriate discharge instructions, pain control, and outpatient follow-up after all of his questions were answered to his expressed satisfaction.   Discharge Condition: Good   Physical Examination:  Constitutional: alert, cooperative and no distress  Respiratory: breathing non-labored at rest  Cardiovascular: regular rate and sinus rhythm  Gastrointestinal: soft, non-tender, and non-distended, no rebound/guarding Integumentary: Ileostomy takedown incision is CDI; penrose in place; minimal serosanguinous drainage from dependent portion.    Allergies as of 01/31/2022       Reactions   Lisinopril Swelling        Medication List     TAKE these medications    cyclobenzaprine 5 MG tablet Commonly known as: FLEXERIL Take 1 tablet (5 mg total) by mouth 3 (three) times daily as needed for muscle spasms.   loratadine 10 MG tablet Commonly known as: CLARITIN Take  10 mg by mouth daily as needed.   multivitamin capsule Take 1 capsule by mouth daily.   oxyCODONE 5 MG immediate release tablet Commonly known as: Oxy IR/ROXICODONE Take 1 tablet (5 mg total) by mouth every 6 (six) hours as needed for severe pain or breakthrough pain.   sildenafil 20 MG tablet Commonly known as: REVATIO Take 20 mg by mouth. Take 2-5 tablets by mouth as needed   valsartan-hydrochlorothiazide 80-12.5 MG tablet Commonly known as: DIOVAN-HCT Take 1 tablet by mouth daily.          Follow-up Information     Olean Ree, MD. Schedule an appointment as soon as possible for a visit on 02/08/2022.   Specialty: General Surgery Why: s/p ileostomy takedown; has staples Contact information: [address] [place] [address] [phone]                  Time spent on discharge management including discussion of hospital course, clinical condition, outpatient instructions, prescriptions, and follow up with the patient and members of the medical team: >30 minutes  -- Edison Simon , PA-C [facility]  01/31/2022, 8:52 AM [phone] M-F: 7am - 4pm

## 2022-01-31 NOTE — TOC CM/SW Note (Signed)
Patient has orders to discharge home today. Chart reviewed. No TOC needs identified. CSW signing off.  Dayton Scrape, Ashley

## 2022-02-01 ENCOUNTER — Encounter: Payer: Self-pay | Admitting: *Deleted

## 2022-02-05 ENCOUNTER — Inpatient Hospital Stay: Payer: BC Managed Care – PPO | Attending: Oncology | Admitting: Oncology

## 2022-02-05 ENCOUNTER — Encounter: Payer: Self-pay | Admitting: Oncology

## 2022-02-05 VITALS — BP 136/82 | HR 75 | Temp 97.2°F | Resp 16 | Ht 70.0 in | Wt 195.0 lb

## 2022-02-05 DIAGNOSIS — C7A020 Malignant carcinoid tumor of the appendix: Secondary | ICD-10-CM | POA: Insufficient documentation

## 2022-02-05 DIAGNOSIS — I10 Essential (primary) hypertension: Secondary | ICD-10-CM | POA: Diagnosis not present

## 2022-02-05 DIAGNOSIS — C7A8 Other malignant neuroendocrine tumors: Secondary | ICD-10-CM

## 2022-02-05 DIAGNOSIS — Z905 Acquired absence of kidney: Secondary | ICD-10-CM | POA: Diagnosis not present

## 2022-02-05 DIAGNOSIS — Z803 Family history of malignant neoplasm of breast: Secondary | ICD-10-CM | POA: Diagnosis not present

## 2022-02-05 DIAGNOSIS — Z8042 Family history of malignant neoplasm of prostate: Secondary | ICD-10-CM | POA: Diagnosis not present

## 2022-02-05 DIAGNOSIS — C61 Malignant neoplasm of prostate: Secondary | ICD-10-CM

## 2022-02-05 NOTE — Progress Notes (Signed)
[place]  [phone] [phone]  ID: MAUDE GLOOR OB: [birth year]  MR#: [id number]  KDT#:[id number]  Patient Care Team: Theotis Burrow, MD as PCP - General (Family Medicine) Clent Jacks, RN as Oncology Nurse Navigator  CHIEF COMPLAINT: Well-differentiated neuroendocrine tumor of appendix-grade 1, prostate cancer, Gleason's 3+4, Renal oncocytic neoplasm, left.  INTERVAL HISTORY: Patient returns to clinic today for routine evaluation.  He recently was discharged from the hospital after ileostomy reversal.  He otherwise feels well.  He is going to initiate XRT prostate cancer in the near future.  He has no neurologic complaints.  He denies any recent fevers or illnesses.  He has a good appetite and denies weight loss he has no chest pain, shortness of breath, cough, or hemoptysis.  He denies any nausea, vomiting, constipation, or diarrhea.  He has no abdominal pain.  He has no urinary complaints.  Patient offers no further specific complaints today.  REVIEW OF SYSTEMS:   Review of Systems  Constitutional: Negative.  Negative for fever, malaise/fatigue and weight loss.  Respiratory: Negative.  Negative for cough, hemoptysis and shortness of breath.   Cardiovascular: Negative.  Negative for chest pain and leg swelling.  Gastrointestinal: Negative.  Negative for abdominal pain, blood in stool, constipation, diarrhea, melena, nausea and vomiting.  Genitourinary: Negative.  Negative for dysuria, frequency and hematuria.  Musculoskeletal: Negative.  Negative for back pain.  Skin: Negative.  Negative for rash.  Neurological: Negative.  Negative for dizziness, focal weakness, weakness and headaches.  Psychiatric/Behavioral: Negative.  The patient is not nervous/anxious.     As per HPI. Otherwise, a complete review of systems is negative.  PAST MEDICAL HISTORY: Past Medical History:  Diagnosis Date   Cancer [facility])    prostate   H/O  left radical nephrectomy    History of hiatal hernia    Hypertension    No blood products    Jehovah's witness    PAST SURGICAL HISTORY: Past Surgical History:  Procedure Laterality Date   APPENDECTOMY     COLONOSCOPY     COLONOSCOPY WITH PROPOFOL N/A 05/23/2021   Procedure: COLONOSCOPY WITH PROPOFOL;  Surgeon: [name], MD;  Location: [facility] ENDOSCOPY;  Service: Gastroenterology;  Laterality: N/A;   COLOSTOMY REVISION N/A 10/30/2021   Procedure: COLON RESECTION RIGHT; DIVERTING ILEOSTOMY, Edison Simon PAC to assist;  Surgeon: Olean Ree, MD;  Location: [facility] ORS;  Service: General;  Laterality: N/A;   CYSTOSCOPY WITH STENT PLACEMENT Right 10/30/2021   Procedure: CYSTOSCOPY WITH URETRAL  STENT PLACEMENT;  Surgeon: Hollice Espy, MD;  Location: [facility] ORS;  Service: Urology;  Laterality: Right;   ILEOSTOMY CLOSURE N/A 01/29/2022   Procedure: ILEOSTOMY TAKEDOWN, open;  Surgeon: Olean Ree, MD;  Location: [facility] ORS;  Service: General;  Laterality: N/A;   LAPAROSCOPIC APPENDECTOMY N/A 07/23/2021   Procedure: APPENDECTOMY LAPAROSCOPIC HAND ASSISTED;  Surgeon: Olean Ree, MD;  Location: [facility] ORS;  Service: General;  Laterality: N/A;   LAPAROSCOPIC NEPHRECTOMY, HAND ASSISTED Left 07/23/2021   Procedure: HAND ASSISTED LAPAROSCOPIC RADICAL NEPHRECTOMY;  Surgeon: Hollice Espy, MD;  Location: [facility] ORS;  Service: Urology;  Laterality: Left;   PARTIAL COLECTOMY N/A 10/30/2021   Procedure: PARTIAL COLECTOMY; SIGMOID COLECTOMY;  Surgeon: Olean Ree, MD;  Location: [facility] ORS;  Service: General;  Laterality: N/A;   RESECTION DISTAL CLAVICAL Right 07/26/2015   Procedure: Open excision of right distal clavicle.;  Surgeon: Corky Mull, MD;  Location: [place];  Service: Orthopedics;  Laterality: Right;   TAKE  DOWN OF INTESTINAL FISTULA  07/23/2021   Procedure: TAKE DOWN OF INTESTINAL FISTULA;  Surgeon: Olean Ree, MD;  Location: [facility] ORS;  Service: General;;    FAMILY  HISTORY: Family History  Problem Relation Age of Onset   Breast cancer Mother    Prostate cancer Father    Prostate cancer Brother    Prostate cancer Brother 56       metastatic   Prostate cancer Brother    Prostate cancer Brother    Prostate cancer Nephew 56    ADVANCED DIRECTIVES (Y/N):  N  HEALTH MAINTENANCE: Social History   Tobacco Use   Smoking status: Never    Passive exposure: Never   Smokeless tobacco: Never  Vaping Use   Vaping Use: Never used  Substance Use Topics   Alcohol use: Not Currently   Drug use: Never     Colonoscopy:  PAP:  Bone density:  Lipid panel:  Allergies  Allergen Reactions   Lisinopril Swelling    Current Outpatient Medications  Medication Sig Dispense Refill   cyclobenzaprine (FLEXERIL) 5 MG tablet Take 1 tablet (5 mg total) by mouth 3 (three) times daily as needed for muscle spasms. 15 tablet 0   loratadine (CLARITIN) 10 MG tablet Take 10 mg by mouth daily as needed.     Multiple Vitamin (MULTIVITAMIN) capsule Take 1 capsule by mouth daily.     oxyCODONE (OXY IR/ROXICODONE) 5 MG immediate release tablet Take 1 tablet (5 mg total) by mouth every 6 (six) hours as needed for severe pain or breakthrough pain. 25 tablet 0   sildenafil (REVATIO) 20 MG tablet Take 20 mg by mouth. Take 2-5 tablets by mouth as needed     valsartan-hydrochlorothiazide (DIOVAN-HCT) 80-12.5 MG tablet Take 1 tablet by mouth daily.     No current facility-administered medications for this visit.    OBJECTIVE: Vitals:   02/05/22 1437  BP: 136/82  Pulse: 75  Resp: 16  Temp: (!) 97.2 F (36.2 C)  SpO2: 100%     Body mass index is 27.98 kg/m.    ECOG FS:0 - Asymptomatic  General: Well-developed, well-nourished, no acute distress. Eyes: Pink conjunctiva, anicteric sclera. HEENT: Normocephalic, moist mucous membranes. Lungs: No audible wheezing or coughing. Heart: Regular rate and rhythm. Abdomen: Well-healing surgical scar. Musculoskeletal: No edema,  cyanosis, or clubbing. Neuro: Alert, answering all questions appropriately. Cranial nerves grossly intact. Skin: No rashes or petechiae noted. Psych: Normal affect   LAB RESULTS:  Lab Results  Component Value Date   NA 139 01/31/2022   K 3.5 01/31/2022   CL 110 01/31/2022   CO2 23 01/31/2022   GLUCOSE 84 01/31/2022   BUN 16 01/31/2022   CREATININE 1.98 (H) 01/31/2022   CALCIUM 9.1 01/31/2022   PROT 8.9 (H) 10/30/2021   ALBUMIN 4.7 10/30/2021   AST 28 10/30/2021   ALT 26 10/30/2021   ALKPHOS 58 10/30/2021   BILITOT 1.1 10/30/2021   GFRNONAA 38 (L) 01/31/2022    Lab Results  Component Value Date   WBC 7.9 01/31/2022   NEUTROABS 3.1 10/30/2021   HGB 12.6 (L) 01/31/2022   HCT 37.7 (L) 01/31/2022   MCV 84.9 01/31/2022   PLT 248 01/31/2022     STUDIES: DG BE (COLON)W SINGLE CM (SOL OR THIN BA)  Result Date: 01/08/2022 CLINICAL DATA:  Postop right colectomy and sigmoidectomy for neuroendocrine tumor of the appendix which had created a fistula from the appendix to the sigmoid colon 10/30/2021. Evaluate for colonic leak prior to takedown  of loop ileostomy. EXAM: WATER SOLUBLE CONTRAST ENEMA TECHNIQUE: After obtaining a scout abdominal radiograph, the rectal tube was inserted and water-soluble contrast (Omnipaque 300 diluted by 25% with water) was instilled under fluoroscopic observation. Patient tolerated the procedure without complications. FLUOROSCOPY: Radiation Exposure Index (as provided by the fluoroscopic device): 32.7 mGy Kerma COMPARISON:  Preoperative abdominopelvic CT 04/17/2021. PET-CT 07/12/2021. FINDINGS: The scout abdominal radiograph demonstrates a nonobstructive bowel gas pattern. There are postsurgical changes related to the previous right colonic surgery and ileostomy. There was free flow of contrast through the colon and the ileocolonic anastomosis. Contrast freely entered the ileostomy bag. No evidence of contrast leak or focal mucosal abnormality. Minimal distal  colonic diverticulosis. Post evacuation views demonstrate no contrast leak or significant bowel distension. IMPRESSION: 1. No evidence of contrast leak status post ileocolonic anastomosis and sigmoid colon resection with anastomosis. Contrast flowed freely into the ileostomy bag. 2. No mucosal abnormalities are identified in the colon. Mild colonic diverticulosis. Electronically Signed   By: Richardean Sale M.D.   On: 01/08/2022 12:07    ASSESSMENT: Well-differentiated neuroendocrine tumor of appendix-grade 1, prostate cancer, Gleason's 3+4, Renal oncocytic neoplasm, left.  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  Patient underwent surgical resection on July 23, 2021 and noted to have a fistula connecting to the sigmoid colon.  Patient also had positive margins at the staple line.  He had reresection on October 09, 2021 which noted 1 lymph node positive for disease increasing his stage to 3.  He recently underwent ileostomy reversal.  Given the low-grade nature of his malignancy, he does not require adjuvant chemotherapy.  No intervention is needed at this time.  Return to clinic in 6 months with repeat imaging and further evaluation.   Renal oncocytic neoplasm: Resected with clear margins.  No further intervention is needed. Prostate cancer: Gleason's 3+4.  Patient reports he is initiating XRT next week.  Follow-up with urology and radiation oncology as scheduled.    Patient expressed understanding and was in agreement with this plan. He also understands that He can call clinic at any time with any questions, concerns, or complaints.    Cancer Staging  Neuroendocrine carcinoma of appendix [facility]) Staging form: Appendix - Neuroendocrine Tumors, AJCC 8th Edition - Clinical stage from 08/21/2021: Stage III (cT3, cN1, cM0) - Signed by Lloyd Huger, MD on 02/05/2022 Histologic grade (G):  G1 Histologic grading system: 3 grade system   Lloyd Huger, MD   02/06/2022 6:36 AM

## 2022-02-11 ENCOUNTER — Encounter: Payer: BC Managed Care – PPO | Admitting: Surgery

## 2022-02-11 ENCOUNTER — Other Ambulatory Visit: Payer: BC Managed Care – PPO

## 2022-02-12 ENCOUNTER — Other Ambulatory Visit: Payer: BC Managed Care – PPO

## 2022-02-13 ENCOUNTER — Ambulatory Visit: Payer: BC Managed Care – PPO | Admitting: Urology

## 2022-02-13 ENCOUNTER — Ambulatory Visit
Admission: RE | Admit: 2022-02-13 | Discharge: 2022-02-13 | Disposition: A | Discharge status: Home / Self Care | Payer: BC Managed Care – PPO | Source: Ambulatory Visit | Attending: Radiation Oncology | Admitting: Radiation Oncology

## 2022-02-13 ENCOUNTER — Other Ambulatory Visit: Payer: BC Managed Care – PPO

## 2022-02-13 ENCOUNTER — Encounter: Payer: Self-pay | Admitting: Radiation Oncology

## 2022-02-13 VITALS — BP 132/82 | HR 66 | Temp 96.3°F | Resp 16 | Ht 70.0 in | Wt 207.7 lb

## 2022-02-13 DIAGNOSIS — C61 Malignant neoplasm of prostate: Secondary | ICD-10-CM | POA: Diagnosis present

## 2022-02-13 NOTE — Progress Notes (Signed)
Radiation Oncology Follow up Note  Name: Patrick Pittman   Date:   02/13/2022 MRN:  [id number] DOB: 04-09-1960    This 62 y.o. male presents to the clinic today for reevaluation and planning for prostate cancer treatment and patient with stage IIIb (T3a N0 M0) mostly Gleason 6 adenocarcinoma the prostate presenting with a PSA in the 12 range.  REFERRING PROVIDER: Theotis Burrow*  HPI: Patient is a 62 year old male who originally consulted back in August 2023 for stage IIIb Gleason 6 adenocarcinoma the prostate presenting with a PSA in the 12 range.  He had a laparoscopic assisted radical nephrectomy back in July.  He also has a neuroendocrine tumor of the appendix which has recently been removed with 1 of 3 lymph nodes positive for metastatic neuroendocrine tumor.  He had a right hemicolectomy and recently had ileostomy reversal.  He is seen today for consideration of proceeding with his radiation treatments..  PET/CT showed no evidence of pelvic adenopathy no evidence of distant disease with diffuse uptake throughout the prostate.  He is doing well since his 6 reversal of his ileostomy.  He is asymptomatic at this time.  COMPLICATIONS OF TREATMENT: none  FOLLOW UP COMPLIANCE: keeps appointments   PHYSICAL EXAM:  BP 132/82   Pulse 66   Temp (!) 96.3 F (35.7 C)   Resp 16   Ht '5\' 10"'$  (1.778 m)   Wt 207 lb 11.2 oz (94.2 kg)   BMI 29.80 kg/m  Patient has a bandage on his right lower abdomen consistent with recent reversal of his ileostomy.  Well-developed well-nourished patient in NAD. HEENT reveals PERLA, EOMI, discs not visualized.  Oral cavity is clear. No oral mucosal lesions are identified. Neck is clear without evidence of cervical or supraclavicular adenopathy. Lungs are clear to A&P. Cardiac examination is essentially unremarkable with regular rate and rhythm without murmur rub or thrill. Abdomen is benign with no organomegaly or masses noted. Motor sensory and DTR levels are  equal and symmetric in the upper and lower extremities. Cranial nerves II through XII are grossly intact. Proprioception is intact. No peripheral adenopathy or edema is identified. No motor or sensory levels are noted. Crude visual fields are within normal range.  RADIOLOGY RESULTS: PET CT scan reviewed  PLAN: This time elected ahead with IMRT radiation therapy to his prostate.  Would plan on delivering 80 Gray over 8 weeks.  I have opted not to treat his pelvic lymph nodes based on low risk threshold for lymph node involvement based on his tumor parameters.  I have asked Dr. Erlene Quan to place fiducial markers in his prostate for daily image guided treatment.  Have also asked him to start on 1 dose of Eligard.  Patient comprehends her recommendations well.  I would like to take this opportunity to thank you for allowing me to participate in the care of your patient.Noreene Filbert, MD

## 2022-02-14 LAB — PSA: Prostate Specific Ag, Serum: 14.6 ng/mL — ABNORMAL HIGH (ref 0.0–4.0)

## 2022-02-15 ENCOUNTER — Ambulatory Visit (INDEPENDENT_AMBULATORY_CARE_PROVIDER_SITE_OTHER): Payer: BC Managed Care – PPO | Admitting: Surgery

## 2022-02-15 ENCOUNTER — Encounter: Payer: Self-pay | Admitting: Surgery

## 2022-02-15 ENCOUNTER — Other Ambulatory Visit: Payer: Self-pay

## 2022-02-15 VITALS — BP 153/92 | HR 66 | Temp 98.3°F | Ht 70.0 in | Wt 203.0 lb

## 2022-02-15 DIAGNOSIS — Z08 Encounter for follow-up examination after completed treatment for malignant neoplasm: Secondary | ICD-10-CM

## 2022-02-15 DIAGNOSIS — Z09 Encounter for follow-up examination after completed treatment for conditions other than malignant neoplasm: Secondary | ICD-10-CM

## 2022-02-15 DIAGNOSIS — Z932 Ileostomy status: Secondary | ICD-10-CM

## 2022-02-15 DIAGNOSIS — C7A8 Other malignant neuroendocrine tumors: Secondary | ICD-10-CM

## 2022-02-15 NOTE — Progress Notes (Signed)
02/15/2022  HPI: Patrick Pittman is a 62 y.o. male s/p loop ileostomy reversal on 01/29/2022.  Patient presents today for follow-up.  He has recently seen Dr. Grayland Ormond with oncology who discussed with patient that no chemotherapy was needed for his neuroendocrine tumor and the next step would be follow-up imaging in about 6 months.  He also recently saw Dr. Baruch Gouty for radiation therapy for his prostate cancer.  He reports that he has been doing well and denies any worsening pain.  The ileostomy wound is having some mild drainage but no purulence.  Tolerating a diet and having normal bowel function.  Denies any incontinence.  Vital signs: BP (!) 153/92   Pulse 66   Temp 98.3 F (36.8 C) (Oral)   Ht 5' 10"$  (1.778 m)   Wt 203 lb (92.1 kg)   SpO2 99%   BMI 29.13 kg/m    Physical Exam: Constitutional: No acute distress Abdomen: Soft, nondistended, nontender to palpation.  Ileostomy wound is healing well with staples and Penrose drain in place.  All of these were removed today without complications.  Steri-Strips were placed.  Dry gauze dressing applied.  Assessment/Plan: This is a 62 y.o. male s/p loop ileostomy reversal.  - Patient is now fully in continuity and has not been having any GI issues.  Discussed with the patient that he may experience diarrhea or very loose stools given that he is now without a right colon and sigmoid colon.  He could take Imodium if needed. - Patient to follow-up in about 6 months as well so we can review with him his repeat CT scan results.   [name], [facility]

## 2022-02-15 NOTE — Patient Instructions (Signed)
Please call with any questions or concerns.

## 2022-02-19 ENCOUNTER — Ambulatory Visit: Payer: BC Managed Care – PPO | Admitting: Urology

## 2022-02-20 ENCOUNTER — Telehealth: Payer: Self-pay

## 2022-02-20 NOTE — Telephone Encounter (Signed)
-----   Message from [place], [place] sent at 02/20/2022 11:56 AM EST ----- Regarding: FW: Markers and Eligard Called [employer]  Pending case # [id number] S/w- Marigene Ehlers is required.Clinicals faxed to [phone] on 2/14 at 958am via ROI.   CM   ----- Message ----- From: Christean Grief, RN Sent: 02/15/2022   9:45 AM EST To: Joyice Faster, CMA; Gordy Clement, CMA Subject: FW: Markers and Eligard                         ----- Message ----- From: Christean Grief, RN Sent: 02/13/2022   9:31 AM EST To: Kyra Manges, CMA; Manus Rudd, RN; # Subject: Markers and Eligard                            Good morning,  This patient will need markers placed and an Eligard injection. He has the markers and has been instructed to bring with him.   Thanks, Ranelle Oyster

## 2022-03-04 NOTE — Telephone Encounter (Signed)
Wanted to let you know I followed up with [employer] and they have received the clinicals. Case is still pending. They have until 2/29 to make a decision!!   CM

## 2022-03-11 NOTE — Telephone Encounter (Signed)
Called [employer] auth for [employer].  Auth # [id number].   Dates: 02/20/22 - 02/19/23.  Ref [id number].  Patient scheduled.

## 2022-04-09 ENCOUNTER — Encounter: Payer: BC Managed Care – PPO | Admitting: Urology

## 2022-04-10 ENCOUNTER — Encounter: Payer: BC Managed Care – PPO | Admitting: Urology

## 2022-04-10 ENCOUNTER — Ambulatory Visit (INDEPENDENT_AMBULATORY_CARE_PROVIDER_SITE_OTHER): Payer: BC Managed Care – PPO | Admitting: Urology

## 2022-04-10 DIAGNOSIS — C61 Malignant neoplasm of prostate: Secondary | ICD-10-CM | POA: Diagnosis not present

## 2022-04-10 MED ORDER — LEUPROLIDE ACETATE (6 MONTH) 45 MG ~~LOC~~ KIT
45.0000 mg | PACK | Freq: Once | SUBCUTANEOUS | Status: AC
  Administered 2022-04-10: 45 mg via SUBCUTANEOUS

## 2022-04-10 MED ORDER — LEVOFLOXACIN 500 MG PO TABS
500.0000 mg | ORAL_TABLET | Freq: Once | ORAL | Status: AC
  Administered 2022-04-10: 500 mg via ORAL

## 2022-04-10 MED ORDER — GENTAMICIN SULFATE 40 MG/ML IJ SOLN
80.0000 mg | Freq: Once | INTRAMUSCULAR | Status: AC
  Administered 2022-04-10: 80 mg via INTRAMUSCULAR

## 2022-04-10 NOTE — Progress Notes (Unsigned)
Eligard SubQ Injection   Due to Prostate Cancer patient is present today for a Eligard Injection.  Medication: Eligard once Dose: 45 mg  Location: left arm  Lot: [id number] Exp: 08/2023  Patient tolerated well, no complications were noted  Performed by: Quame Spratlin H RMA  Per Dr. Erlene Quan patient is to continue therapy for one time dose only.  PA approval dates:  02/20/22-02/22/23

## 2022-04-11 ENCOUNTER — Ambulatory Visit: Payer: BC Managed Care – PPO

## 2022-04-11 NOTE — Progress Notes (Signed)
04/11/22  CC: gold seed markers  HPI: 62 y.o. male with prostate cancer who presents today for placement of fiducial seed markers in anticipation of his upcoming IMRT with Dr. Baruch Gouty.  Prostate Gold Seed Marker Placement Procedure   Informed consent was obtained after discussing risks/benefits of the procedure.  A time out was performed to ensure correct patient identity.  Pre-Procedure: - Gentamicin given prophylactically - PO Levaquin 500 mg also given today  Procedure: -Lidocaine jelly was administered per rectum -Rectal ultrasound probe was placed without difficulty and the prostate visualized - 3 fiducial gold seed markers placed, one at right base, one at left base, one at apex of prostate gland under transrectal ultrasound guidance  Post-Procedure: - Patient tolerated the procedure well - He was counseled to seek immediate medical attention if experiences any severe pain, significant bleeding, or fevers   He is also scheduled to receive his Eligard 61-month Depo injection today.  Risk and benefits were reviewed again.  Plan for follow-up in 6 months with PSA prior  Hollice Espy, MD

## 2022-04-18 ENCOUNTER — Ambulatory Visit
Admission: RE | Admit: 2022-04-18 | Discharge: 2022-04-18 | Disposition: A | Discharge status: Home / Self Care | Payer: BC Managed Care – PPO | Source: Ambulatory Visit | Attending: Radiation Oncology | Admitting: Radiation Oncology

## 2022-04-18 DIAGNOSIS — C61 Malignant neoplasm of prostate: Secondary | ICD-10-CM | POA: Insufficient documentation

## 2022-04-25 DIAGNOSIS — C61 Malignant neoplasm of prostate: Secondary | ICD-10-CM | POA: Diagnosis not present

## 2022-04-26 ENCOUNTER — Other Ambulatory Visit: Payer: Self-pay | Admitting: *Deleted

## 2022-04-26 DIAGNOSIS — C61 Malignant neoplasm of prostate: Secondary | ICD-10-CM

## 2022-04-29 ENCOUNTER — Ambulatory Visit: Admission: RE | Admit: 2022-04-29 | Payer: BC Managed Care – PPO | Source: Ambulatory Visit

## 2022-04-30 ENCOUNTER — Ambulatory Visit
Admission: RE | Admit: 2022-04-30 | Discharge: 2022-04-30 | Disposition: A | Discharge status: Home / Self Care | Payer: BC Managed Care – PPO | Source: Ambulatory Visit | Attending: Radiation Oncology | Admitting: Radiation Oncology

## 2022-04-30 ENCOUNTER — Other Ambulatory Visit: Payer: Self-pay

## 2022-04-30 DIAGNOSIS — C61 Malignant neoplasm of prostate: Secondary | ICD-10-CM | POA: Diagnosis not present

## 2022-04-30 LAB — RAD ONC ARIA SESSION SUMMARY
Course Elapsed Days: 0
Plan Fractions Treated to Date: 1
Plan Prescribed Dose Per Fraction: 2 Gy
Plan Total Fractions Prescribed: 40
Plan Total Prescribed Dose: 80 Gy
Reference Point Dosage Given to Date: 2 Gy
Reference Point Session Dosage Given: 2 Gy
Session Number: 1

## 2022-05-01 ENCOUNTER — Ambulatory Visit
Admission: RE | Admit: 2022-05-01 | Discharge: 2022-05-01 | Disposition: A | Discharge status: Home / Self Care | Payer: BC Managed Care – PPO | Source: Ambulatory Visit | Attending: Radiation Oncology | Admitting: Radiation Oncology

## 2022-05-01 ENCOUNTER — Other Ambulatory Visit: Payer: Self-pay

## 2022-05-01 DIAGNOSIS — C61 Malignant neoplasm of prostate: Secondary | ICD-10-CM | POA: Diagnosis not present

## 2022-05-01 LAB — RAD ONC ARIA SESSION SUMMARY
Course Elapsed Days: 1
Plan Fractions Treated to Date: 2
Plan Prescribed Dose Per Fraction: 2 Gy
Plan Total Fractions Prescribed: 40
Plan Total Prescribed Dose: 80 Gy
Reference Point Dosage Given to Date: 4 Gy
Reference Point Session Dosage Given: 2 Gy
Session Number: 2

## 2022-05-02 ENCOUNTER — Ambulatory Visit
Admission: RE | Admit: 2022-05-02 | Discharge: 2022-05-02 | Disposition: A | Discharge status: Home / Self Care | Payer: BC Managed Care – PPO | Source: Ambulatory Visit | Attending: Radiation Oncology | Admitting: Radiation Oncology

## 2022-05-02 ENCOUNTER — Other Ambulatory Visit: Payer: Self-pay

## 2022-05-02 DIAGNOSIS — C61 Malignant neoplasm of prostate: Secondary | ICD-10-CM | POA: Diagnosis not present

## 2022-05-02 LAB — RAD ONC ARIA SESSION SUMMARY
Course Elapsed Days: 2
Plan Fractions Treated to Date: 3
Plan Prescribed Dose Per Fraction: 2 Gy
Plan Total Fractions Prescribed: 40
Plan Total Prescribed Dose: 80 Gy
Reference Point Dosage Given to Date: 6 Gy
Reference Point Session Dosage Given: 2 Gy
Session Number: 3

## 2022-05-03 ENCOUNTER — Other Ambulatory Visit: Payer: Self-pay

## 2022-05-03 ENCOUNTER — Ambulatory Visit
Admission: RE | Admit: 2022-05-03 | Discharge: 2022-05-03 | Disposition: A | Discharge status: Home / Self Care | Payer: BC Managed Care – PPO | Source: Ambulatory Visit | Attending: Radiation Oncology | Admitting: Radiation Oncology

## 2022-05-03 DIAGNOSIS — C61 Malignant neoplasm of prostate: Secondary | ICD-10-CM | POA: Diagnosis not present

## 2022-05-03 LAB — RAD ONC ARIA SESSION SUMMARY
Course Elapsed Days: 3
Plan Fractions Treated to Date: 4
Plan Prescribed Dose Per Fraction: 2 Gy
Plan Total Fractions Prescribed: 40
Plan Total Prescribed Dose: 80 Gy
Reference Point Dosage Given to Date: 8 Gy
Reference Point Session Dosage Given: 2 Gy
Session Number: 4

## 2022-05-06 ENCOUNTER — Other Ambulatory Visit: Payer: Self-pay

## 2022-05-06 ENCOUNTER — Inpatient Hospital Stay: Payer: BC Managed Care – PPO

## 2022-05-06 ENCOUNTER — Ambulatory Visit
Admission: RE | Admit: 2022-05-06 | Discharge: 2022-05-06 | Disposition: A | Discharge status: Home / Self Care | Payer: BC Managed Care – PPO | Source: Ambulatory Visit | Attending: Radiation Oncology | Admitting: Radiation Oncology

## 2022-05-06 DIAGNOSIS — C61 Malignant neoplasm of prostate: Secondary | ICD-10-CM | POA: Insufficient documentation

## 2022-05-06 DIAGNOSIS — Z8042 Family history of malignant neoplasm of prostate: Secondary | ICD-10-CM | POA: Insufficient documentation

## 2022-05-06 DIAGNOSIS — Z803 Family history of malignant neoplasm of breast: Secondary | ICD-10-CM | POA: Insufficient documentation

## 2022-05-06 DIAGNOSIS — C7A8 Other malignant neuroendocrine tumors: Secondary | ICD-10-CM | POA: Insufficient documentation

## 2022-05-06 LAB — RAD ONC ARIA SESSION SUMMARY
Course Elapsed Days: 6
Plan Fractions Treated to Date: 5
Plan Prescribed Dose Per Fraction: 2 Gy
Plan Total Fractions Prescribed: 40
Plan Total Prescribed Dose: 80 Gy
Reference Point Dosage Given to Date: 10 Gy
Reference Point Session Dosage Given: 2 Gy
Session Number: 5

## 2022-05-06 LAB — CBC (CANCER CENTER ONLY)
HCT: 41.6 % (ref 39.0–52.0)
Hemoglobin: 13.5 g/dL (ref 13.0–17.0)
MCH: 27.8 pg (ref 26.0–34.0)
MCHC: 32.5 g/dL (ref 30.0–36.0)
MCV: 85.6 fL (ref 80.0–100.0)
Platelet Count: 241 10*3/uL (ref 150–400)
RBC: 4.86 MIL/uL (ref 4.22–5.81)
RDW: 13.3 % (ref 11.5–15.5)
WBC Count: 6 10*3/uL (ref 4.0–10.5)
nRBC: 0 % (ref 0.0–0.2)

## 2022-05-07 ENCOUNTER — Ambulatory Visit
Admission: RE | Admit: 2022-05-07 | Discharge: 2022-05-07 | Disposition: A | Discharge status: Home / Self Care | Payer: BC Managed Care – PPO | Source: Ambulatory Visit | Attending: Radiation Oncology | Admitting: Radiation Oncology

## 2022-05-07 ENCOUNTER — Other Ambulatory Visit: Payer: Self-pay

## 2022-05-07 DIAGNOSIS — C61 Malignant neoplasm of prostate: Secondary | ICD-10-CM | POA: Diagnosis not present

## 2022-05-07 LAB — RAD ONC ARIA SESSION SUMMARY
Course Elapsed Days: 7
Plan Fractions Treated to Date: 6
Plan Prescribed Dose Per Fraction: 2 Gy
Plan Total Fractions Prescribed: 40
Plan Total Prescribed Dose: 80 Gy
Reference Point Dosage Given to Date: 12 Gy
Reference Point Session Dosage Given: 2 Gy
Session Number: 6

## 2022-05-08 ENCOUNTER — Other Ambulatory Visit: Payer: Self-pay

## 2022-05-08 ENCOUNTER — Ambulatory Visit
Admission: RE | Admit: 2022-05-08 | Discharge: 2022-05-08 | Disposition: A | Discharge status: Home / Self Care | Payer: BC Managed Care – PPO | Source: Ambulatory Visit | Attending: Radiation Oncology | Admitting: Radiation Oncology

## 2022-05-08 DIAGNOSIS — C61 Malignant neoplasm of prostate: Secondary | ICD-10-CM | POA: Insufficient documentation

## 2022-05-08 LAB — RAD ONC ARIA SESSION SUMMARY
Course Elapsed Days: 8
Plan Fractions Treated to Date: 7
Plan Prescribed Dose Per Fraction: 2 Gy
Plan Total Fractions Prescribed: 40
Plan Total Prescribed Dose: 80 Gy
Reference Point Dosage Given to Date: 14 Gy
Reference Point Session Dosage Given: 2 Gy
Session Number: 7

## 2022-05-09 ENCOUNTER — Ambulatory Visit
Admission: RE | Admit: 2022-05-09 | Discharge: 2022-05-09 | Disposition: A | Discharge status: Home / Self Care | Payer: BC Managed Care – PPO | Source: Ambulatory Visit | Attending: Radiation Oncology | Admitting: Radiation Oncology

## 2022-05-09 ENCOUNTER — Other Ambulatory Visit: Payer: Self-pay

## 2022-05-09 DIAGNOSIS — C61 Malignant neoplasm of prostate: Secondary | ICD-10-CM | POA: Diagnosis not present

## 2022-05-09 LAB — RAD ONC ARIA SESSION SUMMARY
Course Elapsed Days: 9
Plan Fractions Treated to Date: 8
Plan Prescribed Dose Per Fraction: 2 Gy
Plan Total Fractions Prescribed: 40
Plan Total Prescribed Dose: 80 Gy
Reference Point Dosage Given to Date: 16 Gy
Reference Point Session Dosage Given: 2 Gy
Session Number: 8

## 2022-05-10 ENCOUNTER — Ambulatory Visit
Admission: RE | Admit: 2022-05-10 | Discharge: 2022-05-10 | Disposition: A | Discharge status: Home / Self Care | Payer: BC Managed Care – PPO | Source: Ambulatory Visit | Attending: Radiation Oncology | Admitting: Radiation Oncology

## 2022-05-10 ENCOUNTER — Other Ambulatory Visit: Payer: Self-pay

## 2022-05-10 DIAGNOSIS — C61 Malignant neoplasm of prostate: Secondary | ICD-10-CM | POA: Diagnosis not present

## 2022-05-10 LAB — RAD ONC ARIA SESSION SUMMARY
Course Elapsed Days: 10
Plan Fractions Treated to Date: 9
Plan Prescribed Dose Per Fraction: 2 Gy
Plan Total Fractions Prescribed: 40
Plan Total Prescribed Dose: 80 Gy
Reference Point Dosage Given to Date: 18 Gy
Reference Point Session Dosage Given: 2 Gy
Session Number: 9

## 2022-05-13 ENCOUNTER — Other Ambulatory Visit: Payer: Self-pay

## 2022-05-13 ENCOUNTER — Ambulatory Visit
Admission: RE | Admit: 2022-05-13 | Discharge: 2022-05-13 | Disposition: A | Discharge status: Home / Self Care | Payer: BC Managed Care – PPO | Source: Ambulatory Visit | Attending: Radiation Oncology | Admitting: Radiation Oncology

## 2022-05-13 DIAGNOSIS — C61 Malignant neoplasm of prostate: Secondary | ICD-10-CM | POA: Diagnosis not present

## 2022-05-13 LAB — RAD ONC ARIA SESSION SUMMARY
Course Elapsed Days: 13
Plan Fractions Treated to Date: 10
Plan Prescribed Dose Per Fraction: 2 Gy
Plan Total Fractions Prescribed: 40
Plan Total Prescribed Dose: 80 Gy
Reference Point Dosage Given to Date: 20 Gy
Reference Point Session Dosage Given: 2 Gy
Session Number: 10

## 2022-05-14 ENCOUNTER — Other Ambulatory Visit: Payer: Self-pay

## 2022-05-14 ENCOUNTER — Ambulatory Visit
Admission: RE | Admit: 2022-05-14 | Discharge: 2022-05-14 | Disposition: A | Discharge status: Home / Self Care | Payer: BC Managed Care – PPO | Source: Ambulatory Visit | Attending: Radiation Oncology | Admitting: Radiation Oncology

## 2022-05-14 DIAGNOSIS — C61 Malignant neoplasm of prostate: Secondary | ICD-10-CM | POA: Diagnosis not present

## 2022-05-14 LAB — RAD ONC ARIA SESSION SUMMARY
Course Elapsed Days: 14
Plan Fractions Treated to Date: 11
Plan Prescribed Dose Per Fraction: 2 Gy
Plan Total Fractions Prescribed: 40
Plan Total Prescribed Dose: 80 Gy
Reference Point Dosage Given to Date: 22 Gy
Reference Point Session Dosage Given: 2 Gy
Session Number: 11

## 2022-05-15 ENCOUNTER — Other Ambulatory Visit: Payer: Self-pay

## 2022-05-15 ENCOUNTER — Ambulatory Visit
Admission: RE | Admit: 2022-05-15 | Discharge: 2022-05-15 | Disposition: A | Discharge status: Home / Self Care | Payer: BC Managed Care – PPO | Source: Ambulatory Visit | Attending: Radiation Oncology | Admitting: Radiation Oncology

## 2022-05-15 DIAGNOSIS — C61 Malignant neoplasm of prostate: Secondary | ICD-10-CM | POA: Diagnosis not present

## 2022-05-15 LAB — RAD ONC ARIA SESSION SUMMARY
Course Elapsed Days: 15
Plan Fractions Treated to Date: 12
Plan Prescribed Dose Per Fraction: 2 Gy
Plan Total Fractions Prescribed: 40
Plan Total Prescribed Dose: 80 Gy
Reference Point Dosage Given to Date: 24 Gy
Reference Point Session Dosage Given: 2 Gy
Session Number: 12

## 2022-05-16 ENCOUNTER — Other Ambulatory Visit: Payer: Self-pay

## 2022-05-16 ENCOUNTER — Ambulatory Visit
Admission: RE | Admit: 2022-05-16 | Discharge: 2022-05-16 | Disposition: A | Discharge status: Home / Self Care | Payer: BC Managed Care – PPO | Source: Ambulatory Visit | Attending: Radiation Oncology | Admitting: Radiation Oncology

## 2022-05-16 DIAGNOSIS — C61 Malignant neoplasm of prostate: Secondary | ICD-10-CM | POA: Diagnosis not present

## 2022-05-16 LAB — RAD ONC ARIA SESSION SUMMARY
Course Elapsed Days: 16
Plan Fractions Treated to Date: 13
Plan Prescribed Dose Per Fraction: 2 Gy
Plan Total Fractions Prescribed: 40
Plan Total Prescribed Dose: 80 Gy
Reference Point Dosage Given to Date: 26 Gy
Reference Point Session Dosage Given: 2 Gy
Session Number: 13

## 2022-05-17 ENCOUNTER — Other Ambulatory Visit: Payer: Self-pay

## 2022-05-17 ENCOUNTER — Ambulatory Visit
Admission: RE | Admit: 2022-05-17 | Discharge: 2022-05-17 | Disposition: A | Discharge status: Home / Self Care | Payer: BC Managed Care – PPO | Source: Ambulatory Visit | Attending: Radiation Oncology | Admitting: Radiation Oncology

## 2022-05-17 DIAGNOSIS — C61 Malignant neoplasm of prostate: Secondary | ICD-10-CM | POA: Diagnosis not present

## 2022-05-17 LAB — RAD ONC ARIA SESSION SUMMARY
Course Elapsed Days: 17
Plan Fractions Treated to Date: 14
Plan Prescribed Dose Per Fraction: 2 Gy
Plan Total Fractions Prescribed: 40
Plan Total Prescribed Dose: 80 Gy
Reference Point Dosage Given to Date: 28 Gy
Reference Point Session Dosage Given: 2 Gy
Session Number: 14

## 2022-05-20 ENCOUNTER — Other Ambulatory Visit: Payer: Self-pay

## 2022-05-20 ENCOUNTER — Inpatient Hospital Stay: Payer: BC Managed Care – PPO | Attending: Oncology

## 2022-05-20 ENCOUNTER — Ambulatory Visit
Admission: RE | Admit: 2022-05-20 | Discharge: 2022-05-20 | Disposition: A | Discharge status: Home / Self Care | Payer: BC Managed Care – PPO | Source: Ambulatory Visit | Attending: Radiation Oncology | Admitting: Radiation Oncology

## 2022-05-20 DIAGNOSIS — C61 Malignant neoplasm of prostate: Secondary | ICD-10-CM | POA: Diagnosis not present

## 2022-05-20 LAB — RAD ONC ARIA SESSION SUMMARY
Course Elapsed Days: 20
Plan Fractions Treated to Date: 15
Plan Prescribed Dose Per Fraction: 2 Gy
Plan Total Fractions Prescribed: 40
Plan Total Prescribed Dose: 80 Gy
Reference Point Dosage Given to Date: 30 Gy
Reference Point Session Dosage Given: 2 Gy
Session Number: 15

## 2022-05-21 ENCOUNTER — Ambulatory Visit
Admission: RE | Admit: 2022-05-21 | Discharge: 2022-05-21 | Disposition: A | Discharge status: Home / Self Care | Payer: BC Managed Care – PPO | Source: Ambulatory Visit | Attending: Radiation Oncology | Admitting: Radiation Oncology

## 2022-05-21 ENCOUNTER — Other Ambulatory Visit: Payer: Self-pay

## 2022-05-21 DIAGNOSIS — C61 Malignant neoplasm of prostate: Secondary | ICD-10-CM | POA: Diagnosis not present

## 2022-05-21 LAB — RAD ONC ARIA SESSION SUMMARY
Course Elapsed Days: 21
Plan Fractions Treated to Date: 16
Plan Prescribed Dose Per Fraction: 2 Gy
Plan Total Fractions Prescribed: 40
Plan Total Prescribed Dose: 80 Gy
Reference Point Dosage Given to Date: 32 Gy
Reference Point Session Dosage Given: 2 Gy
Session Number: 16

## 2022-05-22 ENCOUNTER — Ambulatory Visit
Admission: RE | Admit: 2022-05-22 | Discharge: 2022-05-22 | Disposition: A | Discharge status: Home / Self Care | Payer: BC Managed Care – PPO | Source: Ambulatory Visit | Attending: Radiation Oncology | Admitting: Radiation Oncology

## 2022-05-22 ENCOUNTER — Other Ambulatory Visit: Payer: Self-pay

## 2022-05-22 DIAGNOSIS — C61 Malignant neoplasm of prostate: Secondary | ICD-10-CM | POA: Diagnosis not present

## 2022-05-22 LAB — RAD ONC ARIA SESSION SUMMARY
Course Elapsed Days: 22
Plan Fractions Treated to Date: 17
Plan Prescribed Dose Per Fraction: 2 Gy
Plan Total Fractions Prescribed: 40
Plan Total Prescribed Dose: 80 Gy
Reference Point Dosage Given to Date: 34 Gy
Reference Point Session Dosage Given: 2 Gy
Session Number: 17

## 2022-05-23 ENCOUNTER — Other Ambulatory Visit: Payer: Self-pay

## 2022-05-23 ENCOUNTER — Ambulatory Visit
Admission: RE | Admit: 2022-05-23 | Discharge: 2022-05-23 | Disposition: A | Discharge status: Home / Self Care | Payer: BC Managed Care – PPO | Source: Ambulatory Visit | Attending: Radiation Oncology | Admitting: Radiation Oncology

## 2022-05-23 DIAGNOSIS — C61 Malignant neoplasm of prostate: Secondary | ICD-10-CM | POA: Diagnosis not present

## 2022-05-23 LAB — RAD ONC ARIA SESSION SUMMARY
Course Elapsed Days: 23
Plan Fractions Treated to Date: 18
Plan Prescribed Dose Per Fraction: 2 Gy
Plan Total Fractions Prescribed: 40
Plan Total Prescribed Dose: 80 Gy
Reference Point Dosage Given to Date: 36 Gy
Reference Point Session Dosage Given: 2 Gy
Session Number: 18

## 2022-05-24 ENCOUNTER — Other Ambulatory Visit: Payer: Self-pay

## 2022-05-24 ENCOUNTER — Ambulatory Visit
Admission: RE | Admit: 2022-05-24 | Discharge: 2022-05-24 | Disposition: A | Discharge status: Home / Self Care | Payer: BC Managed Care – PPO | Source: Ambulatory Visit | Attending: Radiation Oncology | Admitting: Radiation Oncology

## 2022-05-24 DIAGNOSIS — C61 Malignant neoplasm of prostate: Secondary | ICD-10-CM | POA: Diagnosis not present

## 2022-05-24 LAB — RAD ONC ARIA SESSION SUMMARY
Course Elapsed Days: 24
Plan Fractions Treated to Date: 19
Plan Prescribed Dose Per Fraction: 2 Gy
Plan Total Fractions Prescribed: 40
Plan Total Prescribed Dose: 80 Gy
Reference Point Dosage Given to Date: 38 Gy
Reference Point Session Dosage Given: 2 Gy
Session Number: 19

## 2022-05-27 ENCOUNTER — Ambulatory Visit
Admission: RE | Admit: 2022-05-27 | Discharge: 2022-05-27 | Disposition: A | Discharge status: Home / Self Care | Payer: BC Managed Care – PPO | Source: Ambulatory Visit | Attending: Radiation Oncology | Admitting: Radiation Oncology

## 2022-05-27 ENCOUNTER — Other Ambulatory Visit: Payer: Self-pay

## 2022-05-27 DIAGNOSIS — C61 Malignant neoplasm of prostate: Secondary | ICD-10-CM | POA: Diagnosis not present

## 2022-05-27 LAB — RAD ONC ARIA SESSION SUMMARY
Course Elapsed Days: 27
Plan Fractions Treated to Date: 20
Plan Prescribed Dose Per Fraction: 2 Gy
Plan Total Fractions Prescribed: 40
Plan Total Prescribed Dose: 80 Gy
Reference Point Dosage Given to Date: 40 Gy
Reference Point Session Dosage Given: 2 Gy
Session Number: 20

## 2022-05-28 ENCOUNTER — Other Ambulatory Visit: Payer: Self-pay

## 2022-05-28 ENCOUNTER — Ambulatory Visit
Admission: RE | Admit: 2022-05-28 | Discharge: 2022-05-28 | Disposition: A | Discharge status: Home / Self Care | Payer: BC Managed Care – PPO | Source: Ambulatory Visit | Attending: Radiation Oncology | Admitting: Radiation Oncology

## 2022-05-28 DIAGNOSIS — C61 Malignant neoplasm of prostate: Secondary | ICD-10-CM | POA: Diagnosis not present

## 2022-05-28 LAB — RAD ONC ARIA SESSION SUMMARY
Course Elapsed Days: 28
Plan Fractions Treated to Date: 21
Plan Prescribed Dose Per Fraction: 2 Gy
Plan Total Fractions Prescribed: 40
Plan Total Prescribed Dose: 80 Gy
Reference Point Dosage Given to Date: 42 Gy
Reference Point Session Dosage Given: 2 Gy
Session Number: 21

## 2022-05-29 ENCOUNTER — Other Ambulatory Visit: Payer: Self-pay

## 2022-05-29 ENCOUNTER — Ambulatory Visit
Admission: RE | Admit: 2022-05-29 | Discharge: 2022-05-29 | Disposition: A | Discharge status: Home / Self Care | Payer: BC Managed Care – PPO | Source: Ambulatory Visit | Attending: Radiation Oncology | Admitting: Radiation Oncology

## 2022-05-29 DIAGNOSIS — C61 Malignant neoplasm of prostate: Secondary | ICD-10-CM | POA: Diagnosis not present

## 2022-05-29 LAB — RAD ONC ARIA SESSION SUMMARY
Course Elapsed Days: 29
Plan Fractions Treated to Date: 22
Plan Prescribed Dose Per Fraction: 2 Gy
Plan Total Fractions Prescribed: 40
Plan Total Prescribed Dose: 80 Gy
Reference Point Dosage Given to Date: 44 Gy
Reference Point Session Dosage Given: 2 Gy
Session Number: 22

## 2022-05-30 ENCOUNTER — Ambulatory Visit
Admission: RE | Admit: 2022-05-30 | Discharge: 2022-05-30 | Disposition: A | Discharge status: Home / Self Care | Payer: BC Managed Care – PPO | Source: Ambulatory Visit | Attending: Radiation Oncology | Admitting: Radiation Oncology

## 2022-05-30 ENCOUNTER — Other Ambulatory Visit: Payer: Self-pay

## 2022-05-30 DIAGNOSIS — C61 Malignant neoplasm of prostate: Secondary | ICD-10-CM | POA: Diagnosis not present

## 2022-05-30 LAB — RAD ONC ARIA SESSION SUMMARY
Course Elapsed Days: 30
Plan Fractions Treated to Date: 23
Plan Prescribed Dose Per Fraction: 2 Gy
Plan Total Fractions Prescribed: 40
Plan Total Prescribed Dose: 80 Gy
Reference Point Dosage Given to Date: 46 Gy
Reference Point Session Dosage Given: 2 Gy
Session Number: 23

## 2022-05-31 ENCOUNTER — Ambulatory Visit
Admission: RE | Admit: 2022-05-31 | Discharge: 2022-05-31 | Disposition: A | Discharge status: Home / Self Care | Payer: BC Managed Care – PPO | Source: Ambulatory Visit | Attending: Radiation Oncology | Admitting: Radiation Oncology

## 2022-05-31 ENCOUNTER — Other Ambulatory Visit: Payer: Self-pay

## 2022-05-31 DIAGNOSIS — C61 Malignant neoplasm of prostate: Secondary | ICD-10-CM | POA: Diagnosis not present

## 2022-05-31 LAB — RAD ONC ARIA SESSION SUMMARY
Course Elapsed Days: 31
Plan Fractions Treated to Date: 24
Plan Prescribed Dose Per Fraction: 2 Gy
Plan Total Fractions Prescribed: 40
Plan Total Prescribed Dose: 80 Gy
Reference Point Dosage Given to Date: 48 Gy
Reference Point Session Dosage Given: 2 Gy
Session Number: 24

## 2022-06-04 ENCOUNTER — Other Ambulatory Visit: Payer: Self-pay

## 2022-06-04 ENCOUNTER — Ambulatory Visit
Admission: RE | Admit: 2022-06-04 | Discharge: 2022-06-04 | Disposition: A | Discharge status: Home / Self Care | Payer: BC Managed Care – PPO | Source: Ambulatory Visit | Attending: Radiation Oncology | Admitting: Radiation Oncology

## 2022-06-04 DIAGNOSIS — C61 Malignant neoplasm of prostate: Secondary | ICD-10-CM | POA: Diagnosis not present

## 2022-06-04 LAB — RAD ONC ARIA SESSION SUMMARY
Course Elapsed Days: 35
Plan Fractions Treated to Date: 25
Plan Prescribed Dose Per Fraction: 2 Gy
Plan Total Fractions Prescribed: 40
Plan Total Prescribed Dose: 80 Gy
Reference Point Dosage Given to Date: 50 Gy
Reference Point Session Dosage Given: 2 Gy
Session Number: 25

## 2022-06-05 ENCOUNTER — Inpatient Hospital Stay: Payer: BC Managed Care – PPO

## 2022-06-05 ENCOUNTER — Other Ambulatory Visit: Payer: Self-pay

## 2022-06-05 ENCOUNTER — Ambulatory Visit
Admission: RE | Admit: 2022-06-05 | Discharge: 2022-06-05 | Disposition: A | Discharge status: Home / Self Care | Payer: BC Managed Care – PPO | Source: Ambulatory Visit | Attending: Radiation Oncology | Admitting: Radiation Oncology

## 2022-06-05 DIAGNOSIS — C61 Malignant neoplasm of prostate: Secondary | ICD-10-CM | POA: Diagnosis not present

## 2022-06-05 LAB — RAD ONC ARIA SESSION SUMMARY
Course Elapsed Days: 36
Plan Fractions Treated to Date: 26
Plan Prescribed Dose Per Fraction: 2 Gy
Plan Total Fractions Prescribed: 40
Plan Total Prescribed Dose: 80 Gy
Reference Point Dosage Given to Date: 52 Gy
Reference Point Session Dosage Given: 2 Gy
Session Number: 26

## 2022-06-06 ENCOUNTER — Other Ambulatory Visit: Payer: Self-pay

## 2022-06-06 ENCOUNTER — Ambulatory Visit
Admission: RE | Admit: 2022-06-06 | Discharge: 2022-06-06 | Disposition: A | Discharge status: Home / Self Care | Payer: BC Managed Care – PPO | Source: Ambulatory Visit | Attending: Radiation Oncology | Admitting: Radiation Oncology

## 2022-06-06 DIAGNOSIS — C61 Malignant neoplasm of prostate: Secondary | ICD-10-CM | POA: Diagnosis not present

## 2022-06-06 LAB — RAD ONC ARIA SESSION SUMMARY
Course Elapsed Days: 37
Plan Fractions Treated to Date: 27
Plan Prescribed Dose Per Fraction: 2 Gy
Plan Total Fractions Prescribed: 40
Plan Total Prescribed Dose: 80 Gy
Reference Point Dosage Given to Date: 54 Gy
Reference Point Session Dosage Given: 2 Gy
Session Number: 27

## 2022-06-07 ENCOUNTER — Other Ambulatory Visit: Payer: Self-pay

## 2022-06-07 ENCOUNTER — Ambulatory Visit
Admission: RE | Admit: 2022-06-07 | Discharge: 2022-06-07 | Disposition: A | Discharge status: Home / Self Care | Payer: BC Managed Care – PPO | Source: Ambulatory Visit | Attending: Radiation Oncology | Admitting: Radiation Oncology

## 2022-06-07 DIAGNOSIS — C61 Malignant neoplasm of prostate: Secondary | ICD-10-CM | POA: Diagnosis not present

## 2022-06-07 LAB — RAD ONC ARIA SESSION SUMMARY
Course Elapsed Days: 38
Plan Fractions Treated to Date: 28
Plan Prescribed Dose Per Fraction: 2 Gy
Plan Total Fractions Prescribed: 40
Plan Total Prescribed Dose: 80 Gy
Reference Point Dosage Given to Date: 56 Gy
Reference Point Session Dosage Given: 2 Gy
Session Number: 28

## 2022-06-10 ENCOUNTER — Other Ambulatory Visit: Payer: Self-pay

## 2022-06-10 ENCOUNTER — Ambulatory Visit
Admission: RE | Admit: 2022-06-10 | Discharge: 2022-06-10 | Disposition: A | Discharge status: Home / Self Care | Payer: BC Managed Care – PPO | Source: Ambulatory Visit | Attending: Radiation Oncology | Admitting: Radiation Oncology

## 2022-06-10 DIAGNOSIS — C61 Malignant neoplasm of prostate: Secondary | ICD-10-CM | POA: Diagnosis present

## 2022-06-10 LAB — RAD ONC ARIA SESSION SUMMARY
Course Elapsed Days: 41
Plan Fractions Treated to Date: 29
Plan Prescribed Dose Per Fraction: 2 Gy
Plan Total Fractions Prescribed: 40
Plan Total Prescribed Dose: 80 Gy
Reference Point Dosage Given to Date: 58 Gy
Reference Point Session Dosage Given: 2 Gy
Session Number: 29

## 2022-06-11 ENCOUNTER — Ambulatory Visit
Admission: RE | Admit: 2022-06-11 | Discharge: 2022-06-11 | Disposition: A | Discharge status: Home / Self Care | Payer: BC Managed Care – PPO | Source: Ambulatory Visit | Attending: Radiation Oncology | Admitting: Radiation Oncology

## 2022-06-11 ENCOUNTER — Other Ambulatory Visit: Payer: Self-pay

## 2022-06-11 DIAGNOSIS — C61 Malignant neoplasm of prostate: Secondary | ICD-10-CM | POA: Diagnosis not present

## 2022-06-11 LAB — RAD ONC ARIA SESSION SUMMARY
Course Elapsed Days: 42
Plan Fractions Treated to Date: 30
Plan Prescribed Dose Per Fraction: 2 Gy
Plan Total Fractions Prescribed: 40
Plan Total Prescribed Dose: 80 Gy
Reference Point Dosage Given to Date: 60 Gy
Reference Point Session Dosage Given: 2 Gy
Session Number: 30

## 2022-06-12 ENCOUNTER — Other Ambulatory Visit: Payer: Self-pay

## 2022-06-12 ENCOUNTER — Ambulatory Visit
Admission: RE | Admit: 2022-06-12 | Discharge: 2022-06-12 | Disposition: A | Discharge status: Home / Self Care | Payer: BC Managed Care – PPO | Source: Ambulatory Visit | Attending: Radiation Oncology | Admitting: Radiation Oncology

## 2022-06-12 DIAGNOSIS — C61 Malignant neoplasm of prostate: Secondary | ICD-10-CM | POA: Diagnosis not present

## 2022-06-12 LAB — RAD ONC ARIA SESSION SUMMARY
Course Elapsed Days: 43
Plan Fractions Treated to Date: 31
Plan Prescribed Dose Per Fraction: 2 Gy
Plan Total Fractions Prescribed: 40
Plan Total Prescribed Dose: 80 Gy
Reference Point Dosage Given to Date: 62 Gy
Reference Point Session Dosage Given: 2 Gy
Session Number: 31

## 2022-06-13 ENCOUNTER — Ambulatory Visit
Admission: RE | Admit: 2022-06-13 | Discharge: 2022-06-13 | Disposition: A | Discharge status: Home / Self Care | Payer: BC Managed Care – PPO | Source: Ambulatory Visit | Attending: Radiation Oncology | Admitting: Radiation Oncology

## 2022-06-13 ENCOUNTER — Other Ambulatory Visit: Payer: Self-pay

## 2022-06-13 DIAGNOSIS — C61 Malignant neoplasm of prostate: Secondary | ICD-10-CM | POA: Diagnosis not present

## 2022-06-13 LAB — RAD ONC ARIA SESSION SUMMARY
Course Elapsed Days: 44
Plan Fractions Treated to Date: 32
Plan Prescribed Dose Per Fraction: 2 Gy
Plan Total Fractions Prescribed: 40
Plan Total Prescribed Dose: 80 Gy
Reference Point Dosage Given to Date: 64 Gy
Reference Point Session Dosage Given: 2 Gy
Session Number: 32

## 2022-06-14 ENCOUNTER — Other Ambulatory Visit: Payer: Self-pay

## 2022-06-14 ENCOUNTER — Ambulatory Visit
Admission: RE | Admit: 2022-06-14 | Discharge: 2022-06-14 | Disposition: A | Discharge status: Home / Self Care | Payer: BC Managed Care – PPO | Source: Ambulatory Visit | Attending: Radiation Oncology | Admitting: Radiation Oncology

## 2022-06-14 DIAGNOSIS — C61 Malignant neoplasm of prostate: Secondary | ICD-10-CM | POA: Diagnosis not present

## 2022-06-14 LAB — RAD ONC ARIA SESSION SUMMARY
Course Elapsed Days: 45
Plan Fractions Treated to Date: 33
Plan Prescribed Dose Per Fraction: 2 Gy
Plan Total Fractions Prescribed: 40
Plan Total Prescribed Dose: 80 Gy
Reference Point Dosage Given to Date: 66 Gy
Reference Point Session Dosage Given: 2 Gy
Session Number: 33

## 2022-06-17 ENCOUNTER — Ambulatory Visit: Payer: BC Managed Care – PPO

## 2022-06-18 ENCOUNTER — Ambulatory Visit: Payer: BC Managed Care – PPO

## 2022-06-19 ENCOUNTER — Ambulatory Visit: Payer: BC Managed Care – PPO

## 2022-06-20 ENCOUNTER — Ambulatory Visit: Payer: BC Managed Care – PPO

## 2022-06-21 ENCOUNTER — Ambulatory Visit: Payer: BC Managed Care – PPO

## 2022-06-24 ENCOUNTER — Ambulatory Visit
Admission: RE | Admit: 2022-06-24 | Discharge: 2022-06-24 | Disposition: A | Discharge status: Home / Self Care | Payer: BC Managed Care – PPO | Source: Ambulatory Visit | Attending: Radiation Oncology | Admitting: Radiation Oncology

## 2022-06-24 ENCOUNTER — Other Ambulatory Visit: Payer: Self-pay

## 2022-06-24 DIAGNOSIS — C61 Malignant neoplasm of prostate: Secondary | ICD-10-CM | POA: Diagnosis not present

## 2022-06-24 LAB — RAD ONC ARIA SESSION SUMMARY
Course Elapsed Days: 55
Plan Fractions Treated to Date: 34
Plan Prescribed Dose Per Fraction: 2 Gy
Plan Total Fractions Prescribed: 40
Plan Total Prescribed Dose: 80 Gy
Reference Point Dosage Given to Date: 68 Gy
Reference Point Session Dosage Given: 2 Gy
Session Number: 34

## 2022-06-25 ENCOUNTER — Other Ambulatory Visit: Payer: Self-pay

## 2022-06-25 ENCOUNTER — Ambulatory Visit
Admission: RE | Admit: 2022-06-25 | Discharge: 2022-06-25 | Disposition: A | Discharge status: Home / Self Care | Payer: BC Managed Care – PPO | Source: Ambulatory Visit | Attending: Radiation Oncology | Admitting: Radiation Oncology

## 2022-06-25 DIAGNOSIS — C61 Malignant neoplasm of prostate: Secondary | ICD-10-CM | POA: Diagnosis not present

## 2022-06-25 LAB — RAD ONC ARIA SESSION SUMMARY
Course Elapsed Days: 56
Plan Fractions Treated to Date: 35
Plan Prescribed Dose Per Fraction: 2 Gy
Plan Total Fractions Prescribed: 40
Plan Total Prescribed Dose: 80 Gy
Reference Point Dosage Given to Date: 70 Gy
Reference Point Session Dosage Given: 2 Gy
Session Number: 35

## 2022-06-26 ENCOUNTER — Ambulatory Visit
Admission: RE | Admit: 2022-06-26 | Discharge: 2022-06-26 | Disposition: A | Discharge status: Home / Self Care | Payer: BC Managed Care – PPO | Source: Ambulatory Visit | Attending: Radiation Oncology | Admitting: Radiation Oncology

## 2022-06-26 ENCOUNTER — Other Ambulatory Visit: Payer: Self-pay

## 2022-06-26 DIAGNOSIS — C61 Malignant neoplasm of prostate: Secondary | ICD-10-CM | POA: Diagnosis not present

## 2022-06-26 LAB — RAD ONC ARIA SESSION SUMMARY
Course Elapsed Days: 57
Plan Fractions Treated to Date: 36
Plan Prescribed Dose Per Fraction: 2 Gy
Plan Total Fractions Prescribed: 40
Plan Total Prescribed Dose: 80 Gy
Reference Point Dosage Given to Date: 72 Gy
Reference Point Session Dosage Given: 2 Gy
Session Number: 36

## 2022-06-27 ENCOUNTER — Ambulatory Visit
Admission: RE | Admit: 2022-06-27 | Discharge: 2022-06-27 | Disposition: A | Discharge status: Home / Self Care | Payer: BC Managed Care – PPO | Source: Ambulatory Visit | Attending: Radiation Oncology | Admitting: Radiation Oncology

## 2022-06-27 ENCOUNTER — Other Ambulatory Visit: Payer: Self-pay

## 2022-06-27 DIAGNOSIS — C61 Malignant neoplasm of prostate: Secondary | ICD-10-CM | POA: Diagnosis not present

## 2022-06-27 LAB — RAD ONC ARIA SESSION SUMMARY
Course Elapsed Days: 58
Plan Fractions Treated to Date: 37
Plan Prescribed Dose Per Fraction: 2 Gy
Plan Total Fractions Prescribed: 40
Plan Total Prescribed Dose: 80 Gy
Reference Point Dosage Given to Date: 74 Gy
Reference Point Session Dosage Given: 2 Gy
Session Number: 37

## 2022-06-28 ENCOUNTER — Other Ambulatory Visit: Payer: Self-pay

## 2022-06-28 ENCOUNTER — Ambulatory Visit
Admission: RE | Admit: 2022-06-28 | Discharge: 2022-06-28 | Disposition: A | Discharge status: Home / Self Care | Payer: BC Managed Care – PPO | Source: Ambulatory Visit | Attending: Radiation Oncology | Admitting: Radiation Oncology

## 2022-06-28 DIAGNOSIS — C61 Malignant neoplasm of prostate: Secondary | ICD-10-CM | POA: Diagnosis not present

## 2022-06-28 LAB — RAD ONC ARIA SESSION SUMMARY
Course Elapsed Days: 59
Plan Fractions Treated to Date: 38
Plan Prescribed Dose Per Fraction: 2 Gy
Plan Total Fractions Prescribed: 40
Plan Total Prescribed Dose: 80 Gy
Reference Point Dosage Given to Date: 76 Gy
Reference Point Session Dosage Given: 2 Gy
Session Number: 38

## 2022-07-01 ENCOUNTER — Ambulatory Visit
Admission: RE | Admit: 2022-07-01 | Discharge: 2022-07-01 | Disposition: A | Discharge status: Home / Self Care | Payer: BC Managed Care – PPO | Source: Ambulatory Visit | Attending: Radiation Oncology | Admitting: Radiation Oncology

## 2022-07-01 ENCOUNTER — Other Ambulatory Visit: Payer: Self-pay

## 2022-07-01 DIAGNOSIS — C61 Malignant neoplasm of prostate: Secondary | ICD-10-CM | POA: Diagnosis not present

## 2022-07-01 LAB — RAD ONC ARIA SESSION SUMMARY
Course Elapsed Days: 62
Plan Fractions Treated to Date: 39
Plan Prescribed Dose Per Fraction: 2 Gy
Plan Total Fractions Prescribed: 40
Plan Total Prescribed Dose: 80 Gy
Reference Point Dosage Given to Date: 78 Gy
Reference Point Session Dosage Given: 2 Gy
Session Number: 39

## 2022-07-02 ENCOUNTER — Ambulatory Visit
Admission: RE | Admit: 2022-07-02 | Discharge: 2022-07-02 | Disposition: A | Discharge status: Home / Self Care | Payer: BC Managed Care – PPO | Source: Ambulatory Visit | Attending: Radiation Oncology | Admitting: Radiation Oncology

## 2022-07-02 ENCOUNTER — Other Ambulatory Visit: Payer: Self-pay

## 2022-07-02 DIAGNOSIS — C61 Malignant neoplasm of prostate: Secondary | ICD-10-CM | POA: Diagnosis not present

## 2022-07-02 LAB — RAD ONC ARIA SESSION SUMMARY
Course Elapsed Days: 63
Plan Fractions Treated to Date: 40
Plan Prescribed Dose Per Fraction: 2 Gy
Plan Total Fractions Prescribed: 40
Plan Total Prescribed Dose: 80 Gy
Reference Point Dosage Given to Date: 80 Gy
Reference Point Session Dosage Given: 2 Gy
Session Number: 40

## 2022-08-05 ENCOUNTER — Ambulatory Visit: Payer: BC Managed Care – PPO | Admitting: Radiation Oncology

## 2022-08-06 ENCOUNTER — Inpatient Hospital Stay: Payer: BC Managed Care – PPO | Attending: Oncology

## 2022-08-06 ENCOUNTER — Ambulatory Visit: Admission: RE | Admit: 2022-08-06 | Payer: BC Managed Care – PPO | Source: Ambulatory Visit

## 2022-08-08 ENCOUNTER — Other Ambulatory Visit: Payer: BC Managed Care – PPO

## 2022-08-09 ENCOUNTER — Ambulatory Visit: Payer: BC Managed Care – PPO | Admitting: Oncology

## 2022-08-13 ENCOUNTER — Ambulatory Visit: Payer: BC Managed Care – PPO | Admitting: Oncology

## 2022-08-15 ENCOUNTER — Encounter: Payer: Self-pay | Admitting: Radiation Oncology

## 2022-08-15 ENCOUNTER — Other Ambulatory Visit: Payer: Self-pay | Admitting: *Deleted

## 2022-08-15 ENCOUNTER — Ambulatory Visit
Admission: RE | Admit: 2022-08-15 | Discharge: 2022-08-15 | Disposition: A | Discharge status: Home / Self Care | Payer: BC Managed Care – PPO | Source: Ambulatory Visit | Attending: Radiation Oncology | Admitting: Radiation Oncology

## 2022-08-15 VITALS — BP 137/82 | HR 76 | Temp 97.9°F | Resp 16 | Wt 230.0 lb

## 2022-08-15 DIAGNOSIS — C61 Malignant neoplasm of prostate: Secondary | ICD-10-CM | POA: Diagnosis present

## 2022-08-15 NOTE — Progress Notes (Signed)
Radiation Oncology Follow up Note  Name: Patrick Pittman   Date:   08/15/2022 MRN:  [id number] DOB: November 16, 1960    This 62 y.o. male presents to the clinic today for 1 month follow-up status post IMRT radiation therapy to his prostate for Gleason 6 adenocarcinoma presented with a PSA in the 12 range.  REFERRING PROVIDER: Preston Fleeting*  HPI: Patient is a 62 year old male now out 1 month having completed IMRT image guided radiation therapy for Gleason 6 adenocarcinoma presented with a PSA in the 12 range.  He is seen today in routine follow-up is doing well specifically denies any increased lower urinary tract symptoms diarrhea or fatigue..  COMPLICATIONS OF TREATMENT: none  FOLLOW UP COMPLIANCE: keeps appointments   PHYSICAL EXAM:  BP 137/82   Pulse 76   Temp 97.9 F (36.6 C) (Tympanic)   Resp 16   Wt 230 lb (104.3 kg)   BMI 33.00 kg/m  Well-developed well-nourished patient in NAD. HEENT reveals PERLA, EOMI, discs not visualized.  Oral cavity is clear. No oral mucosal lesions are identified. Neck is clear without evidence of cervical or supraclavicular adenopathy. Lungs are clear to A&P. Cardiac examination is essentially unremarkable with regular rate and rhythm without murmur rub or thrill. Abdomen is benign with no organomegaly or masses noted. Motor sensory and DTR levels are equal and symmetric in the upper and lower extremities. Cranial nerves II through XII are grossly intact. Proprioception is intact. No peripheral adenopathy or edema is identified. No motor or sensory levels are noted. Crude visual fields are within normal range.  RADIOLOGY RESULTS: No current films to review  PLAN: Present time patient is doing well very low side effect profile 1 month out from image guided IMRT radiation.  On pleased with his overall progress MS to see him back in about 3 months for follow-up with a PSA at that time.  Patient knows to call with any concerns.  I would like to take  this opportunity to thank you for allowing me to participate in the care of your patient.Carmina Miller, MD

## 2022-08-16 ENCOUNTER — Ambulatory Visit: Admission: RE | Admit: 2022-08-16 | Payer: BC Managed Care – PPO | Source: Ambulatory Visit

## 2022-08-16 DIAGNOSIS — C7A8 Other malignant neuroendocrine tumors: Secondary | ICD-10-CM | POA: Insufficient documentation

## 2022-08-16 LAB — POCT I-STAT CREATININE: Creatinine, Ser: 1.9 mg/dL — ABNORMAL HIGH (ref 0.61–1.24)

## 2022-08-16 MED ORDER — IOHEXOL 300 MG/ML  SOLN
75.0000 mL | Freq: Once | INTRAMUSCULAR | Status: AC | PRN
  Administered 2022-08-16: 75 mL via INTRAVENOUS

## 2022-08-19 ENCOUNTER — Encounter: Payer: Self-pay | Admitting: Surgery

## 2022-08-19 ENCOUNTER — Ambulatory Visit (INDEPENDENT_AMBULATORY_CARE_PROVIDER_SITE_OTHER): Payer: BC Managed Care – PPO | Admitting: Surgery

## 2022-08-19 ENCOUNTER — Other Ambulatory Visit: Payer: Self-pay

## 2022-08-19 VITALS — BP 129/81 | HR 77 | Temp 98.0°F | Ht 70.0 in | Wt 226.0 lb

## 2022-08-19 DIAGNOSIS — C7A8 Other malignant neuroendocrine tumors: Secondary | ICD-10-CM

## 2022-08-19 DIAGNOSIS — K383 Fistula of appendix: Secondary | ICD-10-CM | POA: Diagnosis not present

## 2022-08-19 NOTE — Patient Instructions (Addendum)
We will have you follow up here in 6 months.    Please call and ask to speak with a nurse if you develop questions or concerns.

## 2022-08-19 NOTE — Progress Notes (Signed)
08/19/2022  History of Present Illness: Patrick Pittman is a 62 y.o. male status post laparoscopic hand-assisted left nephrectomy and appendectomy with takedown of Colo appendiceal fistula on 07/23/2021.  This revealed diagnosis of neuroendocrine tumor.  The patient had a subsequent open right colectomy and sigmoidectomy with diverting loop ileostomy on 10/26/2021.  The right colon did not have any further disease but 1 lymph node was positive.  The sigmoid colon did not have any disease.  His loop ileostomy was eventually reversed on 01/29/2022.  He has seen Dr. Rushie Chestnut with radiation oncology and has completed radiation for his prostate cancer.  He has a follow-up with Dr. Orlie Dakin tomorrow and with Dr. Apolinar Junes in October.  He had a CT scan of the abdomen and pelvis last week.  Read is currently pending.  Patient denies any abdominal pain, nausea, vomiting.  His energy level is doing good.  He is not having any troubles with constipation or diarrhea and denies any blood in the stool.  Past Medical History: Past Medical History:  Diagnosis Date   Cancer [facility])    prostate   H/O left radical nephrectomy    History of hiatal hernia    Hypertension    No blood products    Jehovah's witness     Past Surgical History: Past Surgical History:  Procedure Laterality Date   APPENDECTOMY     COLONOSCOPY     COLONOSCOPY WITH PROPOFOL N/A 05/23/2021   Procedure: COLONOSCOPY WITH PROPOFOL;  Surgeon: Wyline Mood, MD;  Location: [facility] ENDOSCOPY;  Service: Gastroenterology;  Laterality: N/A;   COLOSTOMY REVISION N/A 10/30/2021   Procedure: COLON RESECTION RIGHT; DIVERTING ILEOSTOMY, Lynden Oxford PAC to assist;  Surgeon: Henrene Dodge, MD;  Location: [facility] ORS;  Service: General;  Laterality: N/A;   CYSTOSCOPY WITH STENT PLACEMENT Right 10/30/2021   Procedure: CYSTOSCOPY WITH URETRAL  STENT PLACEMENT;  Surgeon: Vanna Scotland, MD;  Location: [facility] ORS;  Service: Urology;  Laterality: Right;   ILEOSTOMY  CLOSURE N/A 01/29/2022   Procedure: ILEOSTOMY TAKEDOWN, open;  Surgeon: Henrene Dodge, MD;  Location: [facility] ORS;  Service: General;  Laterality: N/A;   LAPAROSCOPIC APPENDECTOMY N/A 07/23/2021   Procedure: APPENDECTOMY LAPAROSCOPIC HAND ASSISTED;  Surgeon: Henrene Dodge, MD;  Location: [facility] ORS;  Service: General;  Laterality: N/A;   LAPAROSCOPIC NEPHRECTOMY, HAND ASSISTED Left 07/23/2021   Procedure: HAND ASSISTED LAPAROSCOPIC RADICAL NEPHRECTOMY;  Surgeon: Vanna Scotland, MD;  Location: [facility] ORS;  Service: Urology;  Laterality: Left;   PARTIAL COLECTOMY N/A 10/30/2021   Procedure: PARTIAL COLECTOMY; SIGMOID COLECTOMY;  Surgeon: Henrene Dodge, MD;  Location: [facility] ORS;  Service: General;  Laterality: N/A;   RESECTION DISTAL CLAVICAL Right 07/26/2015   Procedure: Open excision of right distal clavicle.;  Surgeon: Christena Flake, MD;  Location: [facility];  Service: Orthopedics;  Laterality: Right;   TAKE DOWN OF INTESTINAL FISTULA  07/23/2021   Procedure: TAKE DOWN OF INTESTINAL FISTULA;  Surgeon: Henrene Dodge, MD;  Location: [facility] ORS;  Service: General;;    Home Medications: Prior to Admission medications   Medication Sig Start Date End Date Taking? Authorizing Provider  cyclobenzaprine (FLEXERIL) 5 MG tablet Take 1 tablet (5 mg total) by mouth 3 (three) times daily as needed for muscle spasms. 01/31/22  Yes Donovan Kail, PA-C  loratadine (CLARITIN) 10 MG tablet Take 10 mg by mouth daily as needed. 05/24/21  Yes [provider]  Multiple Vitamin (MULTIVITAMIN) capsule Take 1 capsule by mouth daily.   Yes [provider]  sildenafil (REVATIO) 20 MG tablet Take 20 mg by mouth. Take 2-5 tablets by mouth as needed   Yes [provider]  valsartan-hydrochlorothiazide (DIOVAN-HCT) 80-12.5 MG tablet Take 1 tablet by mouth daily. 02/08/21  Yes [provider]    Allergies: Allergies  Allergen Reactions   Lisinopril Swelling    Review of  Systems: Review of Systems  Constitutional:  Negative for chills and fever.  Respiratory:  Negative for shortness of breath.   Cardiovascular:  Negative for chest pain.  Gastrointestinal:  Negative for abdominal pain, blood in stool, constipation, diarrhea, nausea and vomiting.    Physical Exam BP 129/81   Pulse 77   Temp 98 F (36.7 C)   Ht 5\' 10"  (1.778 m)   Wt 226 lb (102.5 kg)   SpO2 98%   BMI 32.43 kg/m  CONSTITUTIONAL: No acute distress, well-nourished HEENT:  Normocephalic, atraumatic, extraocular motion intact. RESPIRATORY:  Lungs are clear, and breath sounds are equal bilaterally. Normal respiratory effort without pathologic use of accessory muscles. CARDIOVASCULAR: Heart is regular without murmurs, gallops, or rubs. GI: The abdomen is soft, nondistended, nontender to palpation.  All incisions are well-healed without any evidence of hernia at this point.  There may be a component of some diastases in the upper abdomen but no true hernias noticed.  NEUROLOGIC:  Motor and sensation is grossly normal.  Cranial nerves are grossly intact. PSYCH:  Alert and oriented to person, place and time. Affect is normal.   Assessment and Plan: This is a 62 y.o. male with neuroendocrine tumor of the appendix status post right colectomy as well as sigmoidectomy due to a Colo appendiceal fistula.  - Patient is doing very well without any evidence of complications at this point.  Denies any abdominal pain, constipation, blood in his stool.  CT scan done last week has not been read yet but on my personal review of the images, the anastomosis are all intact without any evidence of obstruction.  I do not see any gross masses or lymphadenopathy. - Discussed with patient the need for a referral to gastroenterology to repeat his colonoscopy.  His last colonoscopy was with Dr. Tobi Bastos last year prior to his surgeries.  Will send a referral to Dr. Tobi Bastos again to do a repeat colonoscopy towards the end of  October to mark the year time from this main surgery. - Follow-up with me in 6 months.  I spent 20 minutes dedicated to the care of this patient on the date of this encounter to include pre-visit review of records, face-to-face time with the patient discussing diagnosis and management, and any post-visit coordination of care.   Howie Ill, MD [facility]

## 2022-08-20 ENCOUNTER — Encounter: Payer: Self-pay | Admitting: Oncology

## 2022-08-20 ENCOUNTER — Inpatient Hospital Stay: Payer: BC Managed Care – PPO | Attending: Oncology | Admitting: Oncology

## 2022-08-22 ENCOUNTER — Other Ambulatory Visit: Payer: Self-pay

## 2022-08-22 ENCOUNTER — Telehealth: Payer: Self-pay

## 2022-08-22 DIAGNOSIS — C7A8 Other malignant neuroendocrine tumors: Secondary | ICD-10-CM

## 2022-08-22 MED ORDER — NA SULFATE-K SULFATE-MG SULF 17.5-3.13-1.6 GM/177ML PO SOLN: 1.0000 | Freq: Once | ORAL | 0 refills | Status: AC

## 2022-08-22 NOTE — Telephone Encounter (Signed)
Gastroenterology Pre-Procedure Review  Request Date: 11/04/22 Requesting Physician: Dr. Tobi Bastos  PATIENT REVIEW QUESTIONS: The patient responded to the following health history questions as indicated:    1. Are you having any GI issues? no 2. Do you have a personal history of Polyps? yes (05/23/21 last colonoscopy with Dr. Tobi Bastos polyps were noted) 3. Do you have a family history of Colon Cancer or Polyps? no 4. Diabetes Mellitus? no 5. Joint replacements in the past 12 months?no 6. 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 Any artificial heart valves, MVP, or defibrillator?no    MEDICATIONS & ALLERGIES:    Patient reports the following regarding taking any anticoagulation/antiplatelet therapy:   Plavix, Coumadin, Eliquis, Xarelto, Lovenox, Pradaxa, Brilinta, or Effient? no Aspirin? no  Patient confirms/reports the following medications:  Current Outpatient Medications  Medication Sig Dispense Refill   cyclobenzaprine (FLEXERIL) 5 MG tablet Take 1 tablet (5 mg total) by mouth 3 (three) times daily as needed for muscle spasms. 15 tablet 0   loratadine (CLARITIN) 10 MG tablet Take 10 mg by mouth daily as needed.     Multiple Vitamin (MULTIVITAMIN) capsule Take 1 capsule by mouth daily.     sildenafil (REVATIO) 20 MG tablet Take 20 mg by mouth. Take 2-5 tablets by mouth as needed     valsartan-hydrochlorothiazide (DIOVAN-HCT) 80-12.5 MG tablet Take 1 tablet by mouth daily.     No current facility-administered medications for this visit.    Patient confirms/reports the following allergies:  Allergies  Allergen Reactions   Lisinopril Swelling    No orders of the defined types were placed in this encounter.   AUTHORIZATION INFORMATION Primary Insurance: 1D#: Group #:  Secondary Insurance: 1D#: Group #:  SCHEDULE INFORMATION: Date: 11/04/22 Time: Location: [facility]

## 2022-10-07 ENCOUNTER — Other Ambulatory Visit
Admission: RE | Admit: 2022-10-07 | Discharge: 2022-10-07 | Disposition: A | Discharge status: Home / Self Care | Payer: BC Managed Care – PPO | Source: Ambulatory Visit | Attending: Urology | Admitting: Urology

## 2022-10-07 DIAGNOSIS — C61 Malignant neoplasm of prostate: Secondary | ICD-10-CM | POA: Diagnosis present

## 2022-10-07 LAB — PSA: Prostatic Specific Antigen: 0.01 ng/mL (ref 0.00–4.00)

## 2022-10-07 NOTE — Addendum Note (Signed)
Addended by: Yehuda Savannah on: 10/07/2022 09:27 AM   Modules accepted: Orders

## 2022-10-10 ENCOUNTER — Other Ambulatory Visit: Payer: Self-pay

## 2022-10-10 ENCOUNTER — Other Ambulatory Visit: Payer: BC Managed Care – PPO

## 2022-10-10 ENCOUNTER — Telehealth: Payer: Self-pay

## 2022-10-10 DIAGNOSIS — Z1211 Encounter for screening for malignant neoplasm of colon: Secondary | ICD-10-CM

## 2022-10-10 DIAGNOSIS — C7A8 Other malignant neuroendocrine tumors: Secondary | ICD-10-CM

## 2022-10-10 DIAGNOSIS — C61 Malignant neoplasm of prostate: Secondary | ICD-10-CM

## 2022-10-10 NOTE — Telephone Encounter (Signed)
Colonoscopy rescheduled from 11/04/22 to 11/18/22 due to Dr. Tobi Bastos will be out of the office.  Thanks,  [place], [place]

## 2022-10-14 ENCOUNTER — Encounter: Payer: Self-pay | Admitting: Oncology

## 2022-10-14 ENCOUNTER — Inpatient Hospital Stay: Payer: BC Managed Care – PPO | Attending: Oncology | Admitting: Oncology

## 2022-10-14 VITALS — BP 152/93 | HR 73 | Temp 97.2°F | Resp 16 | Ht 70.0 in | Wt 235.0 lb

## 2022-10-14 DIAGNOSIS — Z85060 Personal history of malignant carcinoid tumor of small intestine: Secondary | ICD-10-CM | POA: Insufficient documentation

## 2022-10-14 DIAGNOSIS — Z8042 Family history of malignant neoplasm of prostate: Secondary | ICD-10-CM | POA: Insufficient documentation

## 2022-10-14 DIAGNOSIS — C7A8 Other malignant neuroendocrine tumors: Secondary | ICD-10-CM | POA: Diagnosis not present

## 2022-10-14 DIAGNOSIS — Z803 Family history of malignant neoplasm of breast: Secondary | ICD-10-CM | POA: Insufficient documentation

## 2022-10-14 DIAGNOSIS — Z905 Acquired absence of kidney: Secondary | ICD-10-CM | POA: Diagnosis not present

## 2022-10-14 DIAGNOSIS — C61 Malignant neoplasm of prostate: Secondary | ICD-10-CM | POA: Insufficient documentation

## 2022-10-14 NOTE — Progress Notes (Signed)
Patrick Pittman  [phone] [phone]) [phone]  ID: DAVD GLAZEBROOK OB: 01/04/1961  MR#: [id number]  AOZ#:[id number]  Patient Care Team: Preston Fleeting, MD as PCP - General (Family Medicine) Benita Gutter, RN as Oncology Nurse Navigator  CHIEF COMPLAINT: Well-differentiated neuroendocrine tumor of appendix-grade 1, prostate cancer, Gleason's 3+4, Renal oncocytic neoplasm, left.  INTERVAL HISTORY: Patient returns to clinic today for further evaluation and discussion of his imaging results.  His most recent CT scan was back in August.  He currently feels well and is asymptomatic. He has no neurologic complaints.  He denies any recent fevers or illnesses.  He has a good appetite and denies weight loss.  He has no chest pain, shortness of breath, cough, or hemoptysis.  He denies any nausea, vomiting, constipation, or diarrhea.  He has no abdominal pain.  He denies any melena or hematochezia.  He has no urinary complaints.  Patient offers no specific complaints today.  REVIEW OF SYSTEMS:   Review of Systems  Constitutional: Negative.  Negative for fever, malaise/fatigue and weight loss.  Respiratory: Negative.  Negative for cough, hemoptysis and shortness of breath.   Cardiovascular: Negative.  Negative for chest pain and leg swelling.  Gastrointestinal: Negative.  Negative for abdominal pain, blood in stool, constipation, diarrhea, melena, nausea and vomiting.  Genitourinary: Negative.  Negative for dysuria, frequency and hematuria.  Musculoskeletal: Negative.  Negative for back pain.  Skin: Negative.  Negative for rash.  Neurological: Negative.  Negative for dizziness, focal weakness, weakness and headaches.  Psychiatric/Behavioral: Negative.  The patient is not nervous/anxious.     As per HPI. Otherwise, a complete review of systems is negative.  PAST MEDICAL HISTORY: Past Medical History:  Diagnosis Date   Cancer [facility])    prostate   H/O left  radical nephrectomy    History of hiatal hernia    Hypertension    No blood products    Jehovah's witness    PAST SURGICAL HISTORY: Past Surgical History:  Procedure Laterality Date   APPENDECTOMY     COLONOSCOPY     COLONOSCOPY WITH PROPOFOL N/A 05/23/2021   Procedure: COLONOSCOPY WITH PROPOFOL;  Surgeon: Wyline Mood, MD;  Location: [facility] ENDOSCOPY;  Service: Gastroenterology;  Laterality: N/A;   COLOSTOMY REVISION N/A 10/30/2021   Procedure: COLON RESECTION RIGHT; DIVERTING ILEOSTOMY, Lynden Oxford PAC to assist;  Surgeon: Henrene Dodge, MD;  Location: [facility] ORS;  Service: General;  Laterality: N/A;   CYSTOSCOPY WITH STENT PLACEMENT Right 10/30/2021   Procedure: CYSTOSCOPY WITH URETRAL  STENT PLACEMENT;  Surgeon: Vanna Scotland, MD;  Location: [facility] ORS;  Service: Urology;  Laterality: Right;   ILEOSTOMY CLOSURE N/A 01/29/2022   Procedure: ILEOSTOMY TAKEDOWN, open;  Surgeon: Henrene Dodge, MD;  Location: [facility] ORS;  Service: General;  Laterality: N/A;   LAPAROSCOPIC APPENDECTOMY N/A 07/23/2021   Procedure: APPENDECTOMY LAPAROSCOPIC HAND ASSISTED;  Surgeon: Henrene Dodge, MD;  Location: [facility] ORS;  Service: General;  Laterality: N/A;   LAPAROSCOPIC NEPHRECTOMY, HAND ASSISTED Left 07/23/2021   Procedure: HAND ASSISTED LAPAROSCOPIC RADICAL NEPHRECTOMY;  Surgeon: Vanna Scotland, MD;  Location: [facility] ORS;  Service: Urology;  Laterality: Left;   PARTIAL COLECTOMY N/A 10/30/2021   Procedure: PARTIAL COLECTOMY; SIGMOID COLECTOMY;  Surgeon: Henrene Dodge, MD;  Location: [facility] ORS;  Service: General;  Laterality: N/A;   RESECTION DISTAL CLAVICAL Right 07/26/2015   Procedure: Open excision of right distal clavicle.;  Surgeon: Christena Flake, MD;  Location: [facility];  Service: Orthopedics;  Laterality: Right;  TAKE DOWN OF INTESTINAL FISTULA  07/23/2021   Procedure: TAKE DOWN OF INTESTINAL FISTULA;  Surgeon: Henrene Dodge, MD;  Location: [facility] ORS;  Service: General;;    FAMILY  HISTORY: Family History  Problem Relation Age of Onset   Breast cancer Mother    Prostate cancer Father    Prostate cancer Brother    Prostate cancer Brother 25       metastatic   Prostate cancer Brother    Prostate cancer Brother    Prostate cancer Nephew 39    ADVANCED DIRECTIVES (Y/N):  N  HEALTH MAINTENANCE: Social History   Tobacco Use   Smoking status: Never    Passive exposure: Never   Smokeless tobacco: Never  Vaping Use   Vaping status: Never Used  Substance Use Topics   Alcohol use: Not Currently   Drug use: Never     Colonoscopy:  PAP:  Bone density:  Lipid panel:  Allergies  Allergen Reactions   Lisinopril Swelling    Current Outpatient Medications  Medication Sig Dispense Refill   cyclobenzaprine (FLEXERIL) 5 MG tablet Take 1 tablet (5 mg total) by mouth 3 (three) times daily as needed for muscle spasms. 15 tablet 0   loratadine (CLARITIN) 10 MG tablet Take 10 mg by mouth daily as needed.     Multiple Vitamin (MULTIVITAMIN) capsule Take 1 capsule by mouth daily.     sildenafil (REVATIO) 20 MG tablet Take 20 mg by mouth. Take 2-5 tablets by mouth as needed     valsartan-hydrochlorothiazide (DIOVAN-HCT) 80-12.5 MG tablet Take 1 tablet by mouth daily.     No current facility-administered medications for this visit.    OBJECTIVE: Vitals:   10/14/22 0928  BP: (!) 152/93  Pulse: 73  Resp: 16  Temp: (!) 97.2 F (36.2 C)  SpO2: 100%     Body mass index is 33.72 kg/m.    ECOG FS:0 - Asymptomatic  General: Well-developed, well-nourished, no acute distress. Eyes: Pink conjunctiva, anicteric sclera. HEENT: Normocephalic, moist mucous membranes. Lungs: No audible wheezing or coughing. Heart: Regular rate and rhythm. Abdomen: Soft, nontender, no obvious distention. Musculoskeletal: No edema, cyanosis, or clubbing. Neuro: Alert, answering all questions appropriately. Cranial nerves grossly intact. Skin: No rashes or petechiae noted. Psych: Normal  affect.  LAB RESULTS:  Lab Results  Component Value Date   NA 139 01/31/2022   K 3.5 01/31/2022   CL 110 01/31/2022   CO2 23 01/31/2022   GLUCOSE 84 01/31/2022   BUN 16 01/31/2022   CREATININE 1.90 (H) 08/16/2022   CALCIUM 9.1 01/31/2022   PROT 8.9 (H) 10/30/2021   ALBUMIN 4.7 10/30/2021   AST 28 10/30/2021   ALT 26 10/30/2021   ALKPHOS 58 10/30/2021   BILITOT 1.1 10/30/2021   GFRNONAA 38 (L) 01/31/2022    Lab Results  Component Value Date   WBC 6.0 05/06/2022   NEUTROABS 3.1 10/30/2021   HGB 13.5 05/06/2022   HCT 41.6 05/06/2022   MCV 85.6 05/06/2022   PLT 241 05/06/2022     STUDIES: No results found.  ASSESSMENT: Well-differentiated neuroendocrine tumor of appendix-grade 1, prostate cancer, Gleason's 3+4, Renal oncocytic neoplasm, left.  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  Patient underwent surgical resection on July 23, 2021 and noted to have a fistula connecting to the sigmoid colon.  Patient also had positive margins at the staple line.  He had reresection on October 09, 2021  which noted 1 lymph node positive for disease increasing his stage to 3.  He recently underwent ileostomy reversal.  Given the low-grade nature of his malignancy, he does not require adjuvant chemotherapy.  His most recent imaging on August 27, 2022 did not reveal any evidence of recurrent or progressive disease.  Return to clinic in February 2025 for repeat imaging and further evaluation.   Renal oncocytic neoplasm: Resected with clear margins.  No further intervention is needed.  CT scan results as above. Prostate cancer: Gleason's 3+4.  Patient did XRT in approximately June 2024.  His most recent PSA was undetectable.  Follow-up with urology and radiation oncology as scheduled.    Patient expressed understanding and was in agreement with this plan. He also understands that  He can call clinic at any time with any questions, concerns, or complaints.    Cancer Staging  Neuroendocrine carcinoma of appendix [facility]) Staging form: Appendix - Neuroendocrine Tumors, AJCC 8th Edition - Clinical stage from 08/21/2021: Stage III (cT3, cN1, cM0) - Signed by Jeralyn Ruths, MD on 02/05/2022 Histologic grade (G): G1 Histologic grading system: 3 grade system   Jeralyn Ruths, MD   10/14/2022 9:48 AM

## 2022-10-15 ENCOUNTER — Ambulatory Visit: Payer: BC Managed Care – PPO | Admitting: Urology

## 2022-10-15 VITALS — BP 142/93 | HR 72 | Ht 70.0 in | Wt 236.1 lb

## 2022-10-15 DIAGNOSIS — N528 Other male erectile dysfunction: Secondary | ICD-10-CM | POA: Diagnosis not present

## 2022-10-15 DIAGNOSIS — C61 Malignant neoplasm of prostate: Secondary | ICD-10-CM | POA: Diagnosis not present

## 2022-10-15 MED ORDER — TAMSULOSIN HCL 0.4 MG PO CAPS
0.4000 mg | ORAL_CAPSULE | Freq: Every day | ORAL | 11 refills | Status: DC
Start: 2022-10-15 — End: 2023-11-24

## 2022-10-15 MED ORDER — SILDENAFIL CITRATE 20 MG PO TABS
ORAL_TABLET | ORAL | 11 refills | Status: DC
Start: 2022-10-15 — End: 2023-11-24

## 2022-10-15 NOTE — Progress Notes (Signed)
Marcelle Overlie Plume,acting as a scribe for Vanna Scotland, MD.,have documented all relevant documentation on the behalf of Vanna Scotland, MD,as directed by  Vanna Scotland, MD while in the presence of Vanna Scotland, MD.  10/15/2022 1:44 PM   Patrick Pittman April 23, 1960 [id number]  Referring provider: Preston Fleeting, MD [address] [place],  [place] [address]  Chief Complaint  Patient presents with   Prostate Cancer    HPI: 62 year-old male with a personal history of prostate cancer, left renal mass, and neuroendocrine malignancy of the appendix who returns today for follow up.   He has a complicated urologic history. He had a large left renal mass, solid enhancing and extending into the renal hilum. He underwent left laparoscopic hand assisted nephrectomy on 07/23/2021 along with resection of his neuroendocrine tumor. Renal mass revealed oncocytic neoplasm, benign. He is under the care of the cancer center for his history of neuroendocrine tumor.   He was also diagnosed with prostate cancer around the same time. He was under active surveillance for low-risk prostate cancer, diagnosed in 2021, was known to have progression on MRI to a PI-RADS 3 lesion in 2023, and ended up undergoing a fusion biopsy in 03/2021, indicating Gleason 3+4 disease and a markedly enlarged prostate. He was not deemed a surgical candidate. He was treated with radiation for which he has completed treatment. His final treatment was on 07/01/2022. He also received 6 months of androgen deprivation therapy. His pre-op PSA was 14.6.   His most recent PSA on 10/07/2022 was 0.01.   He returns today for routine follow up. He reports no current issues related to the kidney. He reports nocturia x3-4. Since undergoing radiation, he reports occasional difficulty urinating. He also has hot flashes but understands that this will soon resolve since completing treatment.    PMH: Past Medical History:  Diagnosis  Date   Cancer [facility])    prostate   H/O left radical nephrectomy    History of hiatal hernia    Hypertension    No blood products    Jehovah's witness    Surgical History: Past Surgical History:  Procedure Laterality Date   APPENDECTOMY     COLONOSCOPY     COLONOSCOPY WITH PROPOFOL N/A 05/23/2021   Procedure: COLONOSCOPY WITH PROPOFOL;  Surgeon: Wyline Mood, MD;  Location: [facility] ENDOSCOPY;  Service: Gastroenterology;  Laterality: N/A;   COLOSTOMY REVISION N/A 10/30/2021   Procedure: COLON RESECTION RIGHT; DIVERTING ILEOSTOMY, Lynden Oxford PAC to assist;  Surgeon: Henrene Dodge, MD;  Location: [facility] ORS;  Service: General;  Laterality: N/A;   CYSTOSCOPY WITH STENT PLACEMENT Right 10/30/2021   Procedure: CYSTOSCOPY WITH URETRAL  STENT PLACEMENT;  Surgeon: Vanna Scotland, MD;  Location: [facility] ORS;  Service: Urology;  Laterality: Right;   ILEOSTOMY CLOSURE N/A 01/29/2022   Procedure: ILEOSTOMY TAKEDOWN, open;  Surgeon: Henrene Dodge, MD;  Location: [facility] ORS;  Service: General;  Laterality: N/A;   LAPAROSCOPIC APPENDECTOMY N/A 07/23/2021   Procedure: APPENDECTOMY LAPAROSCOPIC HAND ASSISTED;  Surgeon: Henrene Dodge, MD;  Location: [facility] ORS;  Service: General;  Laterality: N/A;   LAPAROSCOPIC NEPHRECTOMY, HAND ASSISTED Left 07/23/2021   Procedure: HAND ASSISTED LAPAROSCOPIC RADICAL NEPHRECTOMY;  Surgeon: Vanna Scotland, MD;  Location: [facility] ORS;  Service: Urology;  Laterality: Left;   PARTIAL COLECTOMY N/A 10/30/2021   Procedure: PARTIAL COLECTOMY; SIGMOID COLECTOMY;  Surgeon: Henrene Dodge, MD;  Location: [facility] ORS;  Service: General;  Laterality: N/A;   RESECTION DISTAL CLAVICAL Right 07/26/2015  Procedure: Open excision of right distal clavicle.;  Surgeon: Christena Flake, MD;  Location: [facility];  Service: Orthopedics;  Laterality: Right;   TAKE DOWN OF INTESTINAL FISTULA  07/23/2021   Procedure: TAKE DOWN OF INTESTINAL FISTULA;  Surgeon: Henrene Dodge, MD;  Location: [facility] ORS;   Service: General;;    Home Medications:  Allergies as of 10/15/2022       Reactions   Lisinopril Swelling        Medication List        Accurate as of October 15, 2022  1:44 PM. If you have any questions, ask your nurse or doctor.          cyclobenzaprine 5 MG tablet Commonly known as: FLEXERIL Take 1 tablet (5 mg total) by mouth 3 (three) times daily as needed for muscle spasms.   loratadine 10 MG tablet Commonly known as: CLARITIN Take 10 mg by mouth daily as needed.   multivitamin capsule Take 1 capsule by mouth daily.   sildenafil 20 MG tablet Commonly known as: REVATIO Take 1-5 tablets by mouth as needed 1 hour prior to intercourse What changed:  how much to take how to take this additional instructions   tamsulosin 0.4 MG Caps capsule Commonly known as: FLOMAX Take 1 capsule (0.4 mg total) by mouth daily.   valsartan-hydrochlorothiazide 80-12.5 MG tablet Commonly known as: DIOVAN-HCT Take 1 tablet by mouth daily.        Allergies:  Allergies  Allergen Reactions   Lisinopril Swelling    Family History: Family History  Problem Relation Age of Onset   Breast cancer Mother    Prostate cancer Father    Prostate cancer Brother    Prostate cancer Brother 60       metastatic   Prostate cancer Brother    Prostate cancer Brother    Prostate cancer Nephew 18    Social History:  reports that he has never smoked. He has never been exposed to tobacco smoke. He has never used smokeless tobacco. He reports that he does not currently use alcohol. He reports that he does not use drugs.   Physical Exam: BP (!) 142/93   Pulse 72   Ht 5\' 10"  (1.778 m)   Wt 236 lb 2 oz (107.1 kg)   BMI 33.88 kg/m   Constitutional:  Alert and oriented, No acute distress. HEENT: [place] AT, moist mucus membranes.  Trachea midline, no masses. Neurologic: Grossly intact, no focal deficits, moving all 4 extremities. Psychiatric: Normal mood and affect.   Assessment & Plan:     1. Prostate cancer - Completed radiation therapy and adjuvant androgen deprivation therapy x 6 mo.  - The most recent PSA is 0.01, indicating effective treatment - Reports urinary symptoms, likely secondary to radiation therapy - Plan to start Flomax 0.4 mg to alleviate urinary symptoms exacerbated by XRT  2. Erectile dysfunction - Refill of sildenafil sent to [facility] on [address]  Return in about 1 year (around 10/15/2023) for PSA, IPSS, and PVR.  (PSA to be rechecked in 6 mo by Dr. Rushie Chestnut)  I have reviewed the above documentation for accuracy and completeness, and I agree with the above.   Vanna Scotland, MD    [facility] [address], [address] [place], [place] [address] [phone]

## 2022-11-02 ENCOUNTER — Other Ambulatory Visit: Payer: Self-pay | Admitting: Medical Genetics

## 2022-11-02 DIAGNOSIS — Z006 Encounter for examination for normal comparison and control in clinical research program: Secondary | ICD-10-CM

## 2022-11-04 ENCOUNTER — Ambulatory Visit: Admit: 2022-11-04 | Payer: BC Managed Care – PPO | Admitting: Gastroenterology

## 2022-11-04 SURGERY — COLONOSCOPY WITH PROPOFOL
Anesthesia: General

## 2022-11-11 ENCOUNTER — Encounter: Payer: Self-pay | Admitting: Gastroenterology

## 2022-11-18 ENCOUNTER — Ambulatory Visit: Payer: BC Managed Care – PPO | Admitting: General Practice

## 2022-11-18 ENCOUNTER — Ambulatory Visit
Admission: RE | Admit: 2022-11-18 | Discharge: 2022-11-18 | Disposition: A | Discharge status: Home / Self Care | Payer: BC Managed Care – PPO | Attending: Gastroenterology | Admitting: Gastroenterology

## 2022-11-18 ENCOUNTER — Encounter: Payer: Self-pay | Admitting: Gastroenterology

## 2022-11-18 ENCOUNTER — Encounter
Admission: RE | Disposition: A | Discharge status: Home / Self Care | Payer: Self-pay | Source: Home / Self Care | Attending: Gastroenterology

## 2022-11-18 DIAGNOSIS — Z1211 Encounter for screening for malignant neoplasm of colon: Secondary | ICD-10-CM

## 2022-11-18 DIAGNOSIS — Z539 Procedure and treatment not carried out, unspecified reason: Secondary | ICD-10-CM | POA: Insufficient documentation

## 2022-11-18 DIAGNOSIS — C7A8 Other malignant neuroendocrine tumors: Secondary | ICD-10-CM | POA: Insufficient documentation

## 2022-11-18 HISTORY — PX: COLONOSCOPY WITH PROPOFOL: SHX5780

## 2022-11-18 SURGERY — COLONOSCOPY WITH PROPOFOL
Anesthesia: General

## 2022-11-18 MED ORDER — PROPOFOL 1000 MG/100ML IV EMUL
INTRAVENOUS | Status: AC
  Filled 2022-11-18: qty 100

## 2022-11-18 MED ORDER — LIDOCAINE HCL (PF) 2 % IJ SOLN
INTRAMUSCULAR | Status: AC
  Filled 2022-11-18: qty 5

## 2022-11-18 MED ORDER — PROPOFOL 10 MG/ML IV BOLUS
INTRAVENOUS | Status: AC
  Filled 2022-11-18: qty 20

## 2022-11-18 MED ORDER — SODIUM CHLORIDE 0.9 % IV SOLN: INTRAVENOUS | Status: DC

## 2022-11-21 ENCOUNTER — Inpatient Hospital Stay: Payer: BC Managed Care – PPO | Attending: Oncology

## 2022-11-28 ENCOUNTER — Ambulatory Visit: Payer: BC Managed Care – PPO | Attending: Radiation Oncology | Admitting: Radiation Oncology

## 2022-11-28 DIAGNOSIS — C61 Malignant neoplasm of prostate: Secondary | ICD-10-CM | POA: Insufficient documentation

## 2023-02-14 ENCOUNTER — Telehealth: Payer: Self-pay

## 2023-02-14 ENCOUNTER — Encounter: Payer: Self-pay | Admitting: Surgery

## 2023-02-14 ENCOUNTER — Ambulatory Visit (INDEPENDENT_AMBULATORY_CARE_PROVIDER_SITE_OTHER): Payer: BC Managed Care – PPO | Admitting: Surgery

## 2023-02-14 VITALS — BP 123/79 | HR 78 | Temp 98.1°F | Ht 70.0 in | Wt 238.4 lb

## 2023-02-14 DIAGNOSIS — Z08 Encounter for follow-up examination after completed treatment for malignant neoplasm: Secondary | ICD-10-CM

## 2023-02-14 DIAGNOSIS — C7A8 Other malignant neuroendocrine tumors: Secondary | ICD-10-CM

## 2023-02-14 DIAGNOSIS — Z1211 Encounter for screening for malignant neoplasm of colon: Secondary | ICD-10-CM

## 2023-02-14 DIAGNOSIS — Z09 Encounter for follow-up examination after completed treatment for conditions other than malignant neoplasm: Secondary | ICD-10-CM

## 2023-02-14 NOTE — Patient Instructions (Addendum)
 [facility]:  [phone]                          Dr. Therisa Link Reversal An ileostomy reversal is a procedure that reverses a temporary ileostomy. The small intestine is disconnected from the opening (stoma) in the abdomen. It is then reconnected to the rest of the intestine inside the body. After this procedure, a stoma and ostomy bag are no longer needed, and bowel movements can pass through the intestines and the rectum (bowel). Ileostomy reversal surgery is usually done through the stoma. The surgeon may need to make an additional incision, but this is rare. Tell a health care provider about: Any allergies you have. All medicines you are taking, including vitamins, herbs, eye drops, creams, and over-the-counter medicines. Any problems you or family members have had with anesthetic medicines. Any bleeding problems you have. Any surgeries you have had. Any medical conditions you have or have had. Whether you are pregnant or may be pregnant. What are the risks? Generally, this is a safe procedure. However, problems may occur, including: Infection. Bleeding. Allergic reactions to medicines. Damage to nearby structures or organs. Narrowing of the intestine at the place where it was reconnected. Blockage of the intestine (ileus). What happens before the procedure? When to stop eating and drinking Follow instructions from your health care provider about what you may eat and drink. These may include: 8 hours before your procedure Stop eating most foods. Do not eat meat, fried foods, or fatty foods. Eat only light foods, such as toast or crackers. All liquids are okay except energy drinks and alcohol. 6 hours before your procedure Stop eating. Drink only clear liquids, such as water , clear fruit juice, black coffee, plain tea, and sports drinks. Do not drink energy drinks or alcohol. 2 hours before your procedure Stop drinking all liquids. You may be allowed to take medicines  with small sips of water . If you do not follow your health care provider's instructions, your procedure may be delayed or canceled. Medicines Ask your health care provider about: Changing or stopping your regular medicines. These include any diabetes medicines or blood thinners you take. Taking medicines such as aspirin and ibuprofen . These medicines can thin your blood. Do not take them unless your health care provider tells you to. Taking over-the-counter medicines, vitamins, herbs, and supplements. Exams and tests You will have a physical exam, which may include a rectal exam. You may have tests, such as: Blood tests. Stool tests. X-rays. Colonoscopy. General instructions Ask your health care provider what steps will be taken to help prevent infection. These may include: Removing hair at the surgery site. Washing skin with a soap that kills germs. Taking antibiotics. Do not use any products that contain nicotine or tobacco for at least 4-6 weeks before the procedure. These products include cigarettes, chewing tobacco, and vaping devices, such as e-cigarettes. If you need help quitting, ask your health care provider. What happens during the procedure?  An IV will be inserted into one of your veins. You may be given: A sedative. This helps you relax. Anesthesia. This will make you fall asleep for surgery. One or more incisions may be made around your stoma to separate your small intestine from your skin. If there is scar tissue in your abdomen, it will be removed. The small intestine will be reconnected to the rest of the intestine inside your abdomen. Your incision may be: Closed with stitches (sutures),  skin glue, or adhesive strips. Left partially open to help prevent infection while it heals. Covered with a bandage (dressing). The procedure may vary among health care providers and hospitals. What happens after the procedure? You may continue to receive fluids and medicines  through an IV. You will have some pain. Medicine will be available to help you. Your blood pressure, heart rate, breathing rate, and blood oxygen level will be monitored. You may not be able to drink fluids normally or eat solid food until at least 24 hours after your procedure. You may be given ice chips to suck on until you are able to drink fluids normally. You may have to wear compression stockings. These stockings help to prevent blood clots and reduce swelling in your legs. You will be encouraged to: Get out of bed and start walking as soon as you are able. Do deep breathing exercises several times a day to prevent pneumonia. Summary An ileostomy reversal is a procedure that reverses a temporary ileostomy. Before the procedure, follow instructions from your health care provider about taking medicines. Before the procedure, follow instructions from your health care provider about eating and drinking. Do not use any products that contain nicotine or tobacco for at least 4-6 weeks before the procedure. These products include cigarettes, chewing tobacco, and vaping devices, such as e-cigarettes. If you need help quitting, ask your health care provider. After the procedure, you will have some pain. Medicine will be available to help you. This information is not intended to replace advice given to you by your health care provider. Make sure you discuss any questions you have with your health care provider. Document Revised: 07/30/2022 Document Reviewed: 03/06/2021 Elsevier Patient Education  2024 [employer].

## 2023-02-14 NOTE — Progress Notes (Signed)
 02/14/2023  History of Present Illness: Patrick Pittman is a 63 y.o. 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  He presents today for follow up.  He reports that he has been doing well and denies any abdominal pain, nausea, or vomiting.  He has completed radiation for his prostate cancer and his last PSA was low at 0.01.  He was scheduled for colonoscopy on 11/18/22, but this was not done as his bowel prep was apparently poor.  However, he has not called Dr. Williemae office to reschedule his colonoscopy.  Past Medical History: Past Medical History:  Diagnosis Date   Cancer [facility])    prostate   H/O left radical nephrectomy    History of hiatal hernia    Hypertension    No blood products    [employer]ehovah's witness     Past Surgical History: Past Surgical History:  Procedure Laterality Date   APPENDECTOMY     COLONOSCOPY     COLONOSCOPY WITH PROPOFOL  N/A 05/23/2021   Procedure: COLONOSCOPY WITH PROPOFOL ;  Surgeon: Therisa Bi, MD;  Location: [facility] ENDOSCOPY;  Service: Gastroenterology;  Laterality: N/A;   COLONOSCOPY WITH PROPOFOL  N/A 11/18/2022   Procedure: COLONOSCOPY WITH PROPOFOL ;  Surgeon: Therisa Bi, MD;  Location: [facility] ENDOSCOPY;  Service: Gastroenterology;  Laterality: N/A;   COLOSTOMY REVISION N/A 10/30/2021   Procedure: COLON RESECTION RIGHT; DIVERTING ILEOSTOMY, Arthea Platt PAC to assist;  Surgeon: Desiderio Schanz, MD;  Location: [facility] ORS;  Service: General;  Laterality: N/A;   CYSTOSCOPY WITH STENT PLACEMENT Right 10/30/2021   Procedure: CYSTOSCOPY WITH URETRAL  STENT PLACEMENT;  Surgeon: Penne Knee, MD;  Location: [facility] ORS;  Service: Urology;  Laterality: Right;   ILEOSTOMY CLOSURE N/A 01/29/2022   Procedure: ILEOSTOMY TAKEDOWN, open;  Surgeon: Desiderio Schanz, MD;  Location: [facility] ORS;  Service: General;  Laterality: N/A;   LAPAROSCOPIC  APPENDECTOMY N/A 07/23/2021   Procedure: APPENDECTOMY LAPAROSCOPIC HAND ASSISTED;  Surgeon: Desiderio Schanz, MD;  Location: [facility] ORS;  Service: General;  Laterality: N/A;   LAPAROSCOPIC NEPHRECTOMY, HAND ASSISTED Left 07/23/2021   Procedure: HAND ASSISTED LAPAROSCOPIC RADICAL NEPHRECTOMY;  Surgeon: Penne Knee, MD;  Location: [facility] ORS;  Service: Urology;  Laterality: Left;   PARTIAL COLECTOMY N/A 10/30/2021   Procedure: PARTIAL COLECTOMY; SIGMOID COLECTOMY;  Surgeon: Desiderio Schanz, MD;  Location: [facility] ORS;  Service: General;  Laterality: N/A;   RESECTION DISTAL CLAVICAL Right 07/26/2015   Procedure: Open excision of right distal clavicle.;  Surgeon: Norleen JINNY Maltos, MD;  Location: [facility];  Service: Orthopedics;  Laterality: Right;   TAKE DOWN OF INTESTINAL FISTULA  07/23/2021   Procedure: TAKE DOWN OF INTESTINAL FISTULA;  Surgeon: Desiderio Schanz, MD;  Location: [facility] ORS;  Service: General;;    Home Medications: Prior to Admission medications   Medication Sig Start Date End Date Taking? Authorizing Provider  cyclobenzaprine  (FLEXERIL ) 5 MG tablet Take 1 tablet (5 mg total) by mouth 3 (three) times daily as needed for muscle spasms. 01/31/22  Yes Schulz, Zachary R, PA-C  loratadine  (CLARITIN ) 10 MG tablet Take 10 mg by mouth daily as needed. 05/24/21  Yes [provider]  Multiple Vitamin (MULTIVITAMIN) capsule Take 1 capsule by mouth daily.   Yes [provider]  sildenafil  (REVATIO ) 20 MG tablet Take 1-5 tablets by mouth as needed 1 hour prior to intercourse 10/15/22  Yes Penne Knee, MD  tamsulosin  (FLOMAX ) 0.4 MG CAPS  capsule Take 1 capsule (0.4 mg total) by mouth daily. 10/15/22  Yes Penne Knee, MD  valsartan -hydrochlorothiazide  (DIOVAN -HCT) 80-12.5 MG tablet Take 1 tablet by mouth daily. 02/08/21  Yes [provider]    Allergies: Allergies  Allergen Reactions   Lisinopril Swelling    Review of Systems: Review of Systems  Constitutional:   Negative for chills and fever.  Respiratory:  Negative for shortness of breath.   Cardiovascular:  Negative for chest pain.  Gastrointestinal:  Negative for abdominal pain, nausea and vomiting.    Physical Exam BP 123/79   Pulse 78   Temp 98.1 F (36.7 C) (Oral)   Ht 5' 10 (1.778 m)   Wt 238 lb 6.4 oz (108.1 kg)   SpO2 98%   BMI 34.21 kg/m  CONSTITUTIONAL: No acute distress, well nourished. HEENT:  Normocephalic, atraumatic, extraocular motion intact. RESPIRATORY:  Normal respiratory effort without pathologic use of accessory muscles. CARDIOVASCULAR:  Regular rhythm and rate. GI: The abdomen is soft, non-distended, non-tender to palpation.  Incisions well healed.  Has component of diastasis recti in the upper abdomen, but no hernia palpable.  NEUROLOGIC:  Motor and sensation is grossly normal.  Cranial nerves are grossly intact. PSYCH:  Alert and oriented to person, place and time. Affect is normal.  Labs/Imaging: CT abdomen/pelvis on 08/16/22: IMPRESSION: 1. Enlarged, ill-defined and nodular prostate exerts mass effect on the bladder. Difficult to exclude bladder wall invasion. Bladder wall thickening is indicative of an element of outlet obstruction. No definitive evidence of metastatic disease. 2. Scattered sclerotic lesions, the majority of which are stable. 8 mm left sacral lesion appears slightly more sclerotic and minimally larger. Recommend attention on follow-up. 3.  Aortic atherosclerosis (ICD10-I70.0).  Assessment and Plan: This is a 63 y.o. male with neuroendocrine tumor of the appendix, s/p right hemicolectomy and sigmoidectomy.  --The patient has been doing well and denies any specific complaints.  His last CT in August 2024 did not show any new/recurrent disease from his neuroendocrine tumor.  He has a repeat CT scan scheduled for 02/24/23. --He has not completed a colonoscopy yet.  I advised the patient that he should contact Dr. Williemae office in order to  reschedule his colonoscopy right away.  Will give contact info today. --Follow up with me in 6 months.  I spent 20 minutes dedicated to the care of this patient on the date of this encounter to include pre-visit review of records, face-to-face time with the patient discussing diagnosis and management, and any post-visit coordination of care.   Aloysius Sheree Plant, MD [facility]

## 2023-02-14 NOTE — Telephone Encounter (Signed)
 PT requesting call back to schedule return colonoscopy

## 2023-02-17 ENCOUNTER — Other Ambulatory Visit: Payer: Self-pay

## 2023-02-17 DIAGNOSIS — C7A8 Other malignant neuroendocrine tumors: Secondary | ICD-10-CM

## 2023-02-17 DIAGNOSIS — Z1211 Encounter for screening for malignant neoplasm of colon: Secondary | ICD-10-CM

## 2023-02-17 MED ORDER — GOLYTELY 236 G PO SOLR
4000.0000 mL | Freq: Every day | ORAL | 0 refills | Status: AC
Start: 2023-02-17 — End: 2023-02-19

## 2023-02-17 NOTE — Telephone Encounter (Signed)
 okay

## 2023-02-17 NOTE — Telephone Encounter (Signed)
 Gastroenterology Pre-Procedure Review  Request Date: 03/05/23 Requesting Physician: Dr. Antony Baumgartner  PATIENT REVIEW QUESTIONS: The patient responded to the following health history questions as indicated:    1. Are you having any GI issues? no 2. Do you have a personal history of Polyps? yes (last colonoscopy performed by Dr. Antony Baumgartner 05/23/2021 recommended repeat in 3 years however due to history of neuroendocrine cancer of the appendix in 2024 repeat was requested 08/19/22 from Dr. Mauri Sous and scheduled but prep was not good had to repeat ) 3. Do you have a family history of Colon Cancer or Polyps? no 4. Diabetes Mellitus? no 5. Joint replacements in the past 12 months?no 6. Major health problems in the past 3 months?no 7. Any artificial heart valves, MVP, or defibrillator?no    MEDICATIONS & ALLERGIES:    Patient reports the following regarding taking any anticoagulation/antiplatelet therapy:   Plavix, Coumadin, Eliquis, Xarelto, Lovenox, Pradaxa, Brilinta, or Effient? no Aspirin? no  Patient confirms/reports the following medications:  Current Outpatient Medications  Medication Sig Dispense Refill   polyethylene glycol (GOLYTELY ) 236 g solution Take 4,000 mLs by mouth daily for 2 days. 8000 mL 0   cyclobenzaprine  (FLEXERIL ) 5 MG tablet Take 1 tablet (5 mg total) by mouth 3 (three) times daily as needed for muscle spasms. 15 tablet 0   loratadine  (CLARITIN ) 10 MG tablet Take 10 mg by mouth daily as needed.     Multiple Vitamin (MULTIVITAMIN) capsule Take 1 capsule by mouth daily.     sildenafil  (REVATIO ) 20 MG tablet Take 1-5 tablets by mouth as needed 1 hour prior to intercourse 30 tablet 11   tamsulosin  (FLOMAX ) 0.4 MG CAPS capsule Take 1 capsule (0.4 mg total) by mouth daily. 30 capsule 11   valsartan -hydrochlorothiazide  (DIOVAN -HCT) 80-12.5 MG tablet Take 1 tablet by mouth daily.     No current facility-administered medications for this visit.    Patient confirms/reports the following  allergies:  Allergies  Allergen Reactions   Lisinopril Swelling    No orders of the defined types were placed in this encounter.   AUTHORIZATION INFORMATION Primary Insurance: 1D#: Group #:  Secondary Insurance: 1D#: Group #:  SCHEDULE INFORMATION: Date: 03/05/23 Time: Location: [facility]

## 2023-02-17 NOTE — Addendum Note (Signed)
 Addended by: Leellen Puller on: 02/17/2023 02:41 PM   Modules accepted: Orders

## 2023-02-17 NOTE — Telephone Encounter (Signed)
 Patient left a voicemail at 2:00pm on main voicemail stating he called on Friday and no one called him back. He is wanting to schedule his colonoscopy

## 2023-02-18 MED ORDER — NA SULFATE-K SULFATE-MG SULF 17.5-3.13-1.6 GM/177ML PO SOLN: 354.0000 mL | Freq: Once | ORAL | 0 refills | Status: AC

## 2023-02-18 NOTE — Addendum Note (Signed)
Addended by: Adela Ports on: 02/18/2023 11:19 AM   Modules accepted: Orders

## 2023-02-24 ENCOUNTER — Ambulatory Visit
Admission: RE | Admit: 2023-02-24 | Discharge: 2023-02-24 | Disposition: A | Discharge status: Home / Self Care | Payer: BC Managed Care – PPO | Source: Ambulatory Visit | Attending: Oncology | Admitting: Oncology

## 2023-02-24 DIAGNOSIS — C7A8 Other malignant neuroendocrine tumors: Secondary | ICD-10-CM | POA: Diagnosis present

## 2023-02-24 LAB — POCT I-STAT CREATININE: Creatinine, Ser: 2 mg/dL — ABNORMAL HIGH (ref 0.61–1.24)

## 2023-02-24 MED ORDER — IOHEXOL 300 MG/ML  SOLN
100.0000 mL | Freq: Once | INTRAMUSCULAR | Status: AC | PRN
  Administered 2023-02-24: 80 mL via INTRAVENOUS

## 2023-03-03 ENCOUNTER — Telehealth: Payer: Self-pay

## 2023-03-03 MED ORDER — PEG 3350-KCL-NA BICARB-NACL 420 G PO SOLR: 4000.0000 mL | Freq: Once | ORAL | 0 refills | Status: AC

## 2023-03-03 NOTE — Telephone Encounter (Signed)
 Received message from Dava in Endo stating that patient only had 1 prep and that was for Suprep.  My instructions indicated that he would be doing a 2 day prep using Nulytely or Golytely, but (1) Suprep was sent.  Contacted patient and informed him that he will need an additional prep as the instructions reflected with Golytely but since he has Suprep we can do the Golytely and Suprep.  Informed him that I will send over prep for Nulytely or Golytely to be started this evening at 5pm no later than 6pm. Fill container to the fill line with clear liquid.  Drink 8 oz every 15-30 mins until entire contents have been completed.  Continue with clear liquid diet today and tomorrow. Pt forgot he was supposed to do 2 days of clear liquid he did eat cereal at breakfast and lunch today.  Start Suprep tomorrow evening at 5 pm mix with 16oz of clear liquid.  Drink entire contents.  Repeat 5 hours prior to the time of his colonoscopy then nothing else to eat or drink 4 hours prior to colonoscopy.  Pt verbalized understanding. Nulytely prep sent to pharmacy.  Thanks,  [place], [place]

## 2023-03-04 ENCOUNTER — Encounter: Payer: Self-pay | Admitting: Gastroenterology

## 2023-03-04 ENCOUNTER — Inpatient Hospital Stay: Payer: BC Managed Care – PPO | Admitting: Oncology

## 2023-03-05 ENCOUNTER — Ambulatory Visit: Payer: BC Managed Care – PPO | Admitting: Anesthesiology

## 2023-03-05 ENCOUNTER — Encounter
Admission: RE | Disposition: A | Discharge status: Home / Self Care | Payer: Self-pay | Source: Home / Self Care | Attending: Gastroenterology

## 2023-03-05 ENCOUNTER — Ambulatory Visit
Admission: RE | Admit: 2023-03-05 | Discharge: 2023-03-05 | Disposition: A | Discharge status: Home / Self Care | Payer: BC Managed Care – PPO | Attending: Gastroenterology | Admitting: Gastroenterology

## 2023-03-05 ENCOUNTER — Encounter: Payer: Self-pay | Admitting: Gastroenterology

## 2023-03-05 DIAGNOSIS — K640 First degree hemorrhoids: Secondary | ICD-10-CM | POA: Insufficient documentation

## 2023-03-05 DIAGNOSIS — Z1211 Encounter for screening for malignant neoplasm of colon: Secondary | ICD-10-CM | POA: Diagnosis not present

## 2023-03-05 DIAGNOSIS — Z980 Intestinal bypass and anastomosis status: Secondary | ICD-10-CM | POA: Insufficient documentation

## 2023-03-05 DIAGNOSIS — K6289 Other specified diseases of anus and rectum: Secondary | ICD-10-CM

## 2023-03-05 DIAGNOSIS — I10 Essential (primary) hypertension: Secondary | ICD-10-CM | POA: Insufficient documentation

## 2023-03-05 HISTORY — PX: BIOPSY: SHX5522

## 2023-03-05 HISTORY — PX: COLONOSCOPY WITH PROPOFOL: SHX5780

## 2023-03-05 SURGERY — COLONOSCOPY WITH PROPOFOL
Anesthesia: General

## 2023-03-05 MED ORDER — PROPOFOL 500 MG/50ML IV EMUL
INTRAVENOUS | Status: DC | PRN
  Administered 2023-03-05: 200 mg via INTRAVENOUS
  Administered 2023-03-05: 200 ug/kg/min via INTRAVENOUS

## 2023-03-05 MED ORDER — SODIUM CHLORIDE 0.9 % IV SOLN: INTRAVENOUS | Status: DC

## 2023-03-05 NOTE — Transfer of Care (Signed)
 Immediate Anesthesia Transfer of Care Note  Patient: Patrick Pittman  Procedure(s) Performed: COLONOSCOPY WITH PROPOFOL BIOPSY  Patient Location: PACU  Anesthesia Type:General  Level of Consciousness: drowsy  Airway & Oxygen Therapy: Patient Spontanous Breathing  Post-op Assessment: Report given to RN and Post -op Vital signs reviewed and stable  Post vital signs: Reviewed and stable  Last Vitals:  Vitals Value Taken Time  BP 95/60 03/05/23 0921  Temp    Pulse 82 03/05/23 0922  Resp 29 03/05/23 0922  SpO2 100 % 03/05/23 0922  Vitals shown include unfiled device data.  Last Pain:  Vitals:   03/05/23 0920  TempSrc:   PainSc: Asleep         Complications: There were no known notable events for this encounter.

## 2023-03-05 NOTE — Anesthesia Postprocedure Evaluation (Signed)
 Anesthesia Post Note  Patient: Patrick Pittman  Procedure(s) Performed: COLONOSCOPY WITH PROPOFOL BIOPSY  Patient location during evaluation: Endoscopy Anesthesia Type: General Level of consciousness: awake and alert Pain management: pain level controlled Vital Signs Assessment: post-procedure vital signs reviewed and stable Respiratory status: spontaneous breathing, nonlabored ventilation, respiratory function stable and patient connected to nasal cannula oxygen Cardiovascular status: blood pressure returned to baseline and stable Postop Assessment: no apparent nausea or vomiting Anesthetic complications: no   There were no known notable events for this encounter.   Last Vitals:  Vitals:   03/05/23 0930 03/05/23 0940  BP: 108/66 107/70  Pulse: 64 69  Resp: 16 16  Temp:    SpO2: 100% 100%    Last Pain:  Vitals:   03/05/23 0940  TempSrc:   PainSc: 0-No pain                 Cleda Mccreedy Alexsander Cavins

## 2023-03-05 NOTE — Op Note (Signed)
 [facility] Gastroenterology Patient Name: Patrick Pittman Procedure Date: 03/05/2023 8:53 AM MRN: [id number] Account #: [id number] Date of Birth: [birth year] Admit Type: Outpatient Age: 63 Room: [facility] ENDO ROOM 3 Gender: Male Note Status: Finalized Instrument Name: Prentice Docker [id number] Procedure:             Colonoscopy Indications:           Screening for colorectal malignant neoplasm Providers:             Wyline Mood MD, MD Referring MD:          Preston Fleeting (Referring MD) Medicines:             Monitored Anesthesia Care Complications:         No immediate complications. Procedure:             Pre-Anesthesia Assessment:                        - Prior to the procedure, a History and Physical was                         performed, and patient medications, allergies and                         sensitivities were reviewed. The patient's tolerance                         of previous anesthesia was reviewed.                        - The risks and benefits of the procedure and the                         sedation options and risks were discussed with the                         patient. All questions were answered and informed                         consent was obtained.                        - After reviewing the risks and benefits, the patient                         was deemed in satisfactory condition to undergo the                         procedure.                        - ASA Grade Assessment: II - A patient with mild                         systemic disease.                        - ASA Grade Assessment: II - A patient with mild                         systemic disease.  After obtaining informed consent, the colonoscope was                         passed under direct vision. Throughout the procedure,                         the patient's blood pressure, pulse, and oxygen                         saturations were monitored  continuously. The                         Colonoscope was introduced through the anus and                         advanced to the the ileocolonic anastomosis. The                         colonoscopy was performed with ease. The patient                         tolerated the procedure well. The quality of the bowel                         preparation was excellent. The ileocecal valve,                         appendiceal orifice, and rectum were photographed. Findings:      The perianal and digital rectal examinations were normal.      The mucosa vascular pattern in the rectum was locally increased.       consistent with radiation proctitis      There was evidence of a prior end-to-end ileo-colonic anastomosis in the       transverse colon. This was characterized by inflammation. This was       biopsied with a cold forceps for histology.      Non-bleeding internal hemorrhoids were found during retroflexion. The       hemorrhoids were large and Grade I (internal hemorrhoids that do not       prolapse).      The exam was otherwise without abnormality on direct and retroflexion       views. Impression:            - Increased mucosa vascular pattern in the rectum.                        - End-to-end ileo-colonic anastomosis, characterized                         by inflammation. Biopsied.                        - Non-bleeding internal hemorrhoids.                        - The examination was otherwise normal on direct and                         retroflexion views. Recommendation:        - Discharge patient to home (  with escort).                        - Resume previous diet.                        - Continue present medications.                        - Await pathology results.                        - Repeat colonoscopy in 3 years for surveillance. Procedure Code(s):     --- Professional ---                        [phone], Colonoscopy, flexible; with biopsy, single or                          multiple Diagnosis Code(s):     --- Professional ---                        Z12.11, Encounter for screening for malignant neoplasm                         of colon                        K64.0, First degree hemorrhoids                        Z98.0, Intestinal bypass and anastomosis status CPT copyright 2022 [employer]. All rights reserved. The codes documented in this report are preliminary and upon coder review may  be revised to meet current compliance requirements. Wyline Mood, MD Wyline Mood MD, MD 03/05/2023 9:19:52 AM This report has been signed electronically. Number of Addenda: 0 Note Initiated On: 03/05/2023 8:53 AM Scope Withdrawal Time: 0 hours 9 minutes 21 seconds  Total Procedure Duration: 0 hours 11 minutes 19 seconds  Estimated Blood Loss:  Estimated blood loss: none.      [facility]

## 2023-03-05 NOTE — Anesthesia Preprocedure Evaluation (Signed)
 Anesthesia Evaluation  Patient identified by MRN, date of birth, ID band Patient awake    Reviewed: Allergy & Precautions, NPO status , Patient's Chart, lab work & pertinent test results  Airway Mallampati: III  TM Distance: >3 FB Neck ROM: full    Dental  (+) Chipped   Pulmonary neg pulmonary ROS, neg shortness of breath   Pulmonary exam normal        Cardiovascular Exercise Tolerance: Good hypertension, (-) angina Normal cardiovascular exam     Neuro/Psych negative neurological ROS  negative psych ROS   GI/Hepatic negative GI ROS, Neg liver ROS,neg GERD  ,,  Endo/Other  negative endocrine ROS    Renal/GU Renal disease  negative genitourinary   Musculoskeletal   Abdominal   Peds  Hematology negative hematology ROS (+)   Anesthesia Other Findings Past Medical History: No date: Cancer (HCC)     Comment:  prostate No date: H/O left radical nephrectomy No date: History of hiatal hernia No date: Hypertension No date: No blood products     Comment:  Jehovah's witness  Past Surgical History: No date: APPENDECTOMY No date: COLONOSCOPY 05/23/2021: COLONOSCOPY WITH PROPOFOL; N/A     Comment:  Procedure: COLONOSCOPY WITH PROPOFOL;  Surgeon: Wyline Mood, MD;  Location: [facility] ENDOSCOPY;  Service:               Gastroenterology;  Laterality: N/A; 11/18/2022: COLONOSCOPY WITH PROPOFOL; N/A     Comment:  Procedure: COLONOSCOPY WITH PROPOFOL;  Surgeon: Wyline Mood, MD;  Location: [facility] ENDOSCOPY;  Service:               Gastroenterology;  Laterality: N/A; 10/30/2021: COLOSTOMY REVISION; N/A     Comment:  Procedure: COLON RESECTION RIGHT; DIVERTING ILEOSTOMY,               Lynden Oxford PAC to assist;  Surgeon: Henrene Dodge,               MD;  Location: [facility] ORS;  Service: General;  Laterality:               N/A; 10/30/2021: CYSTOSCOPY WITH STENT PLACEMENT; Right     Comment:  Procedure:  CYSTOSCOPY WITH URETRAL  STENT PLACEMENT;                Surgeon: Vanna Scotland, MD;  Location: [facility] ORS;                Service: Urology;  Laterality: Right; 01/29/2022: ILEOSTOMY CLOSURE; N/A     Comment:  Procedure: ILEOSTOMY TAKEDOWN, open;  Surgeon: Henrene Dodge, MD;  Location: [facility] ORS;  Service: General;                Laterality: N/A; 07/23/2021: LAPAROSCOPIC APPENDECTOMY; N/A     Comment:  Procedure: APPENDECTOMY LAPAROSCOPIC HAND ASSISTED;                Surgeon: Henrene Dodge, MD;  Location: [facility] ORS;                Service: General;  Laterality: N/A; 07/23/2021: LAPAROSCOPIC NEPHRECTOMY, HAND ASSISTED; Left     Comment:  Procedure: HAND ASSISTED LAPAROSCOPIC RADICAL               NEPHRECTOMY;  Surgeon:  Vanna Scotland, MD;  Location:               [facility] ORS;  Service: Urology;  Laterality: Left; 10/30/2021: PARTIAL COLECTOMY; N/A     Comment:  Procedure: PARTIAL COLECTOMY; SIGMOID COLECTOMY;                Surgeon: Henrene Dodge, MD;  Location: [facility] ORS;                Service: General;  Laterality: N/A; 07/26/2015: RESECTION DISTAL CLAVICAL; Right     Comment:  Procedure: Open excision of right distal clavicle.;                Surgeon: Christena Flake, MD;  Location: [facility]               [facility];  Service: Orthopedics;  Laterality: Right; 07/23/2021: TAKE DOWN OF INTESTINAL FISTULA     Comment:  Procedure: TAKE DOWN OF INTESTINAL FISTULA;  Surgeon:               Henrene Dodge, MD;  Location: [facility] ORS;  Service:               General;;  BMI    Body Mass Index: 32.83 kg/m      Reproductive/Obstetrics negative OB ROS                             Anesthesia Physical Anesthesia Plan  ASA: 2  Anesthesia Plan: General   Post-op Pain Management:    Induction: Intravenous  PONV Risk Score and Plan: Propofol infusion and TIVA  Airway Management Planned: Natural Airway and Nasal Cannula  Additional Equipment:   Intra-op  Plan:   Post-operative Plan:   Informed Consent: I have reviewed the patients History and Physical, chart, labs and discussed the procedure including the risks, benefits and alternatives for the proposed anesthesia with the patient or authorized representative who has indicated his/her understanding and acceptance.     Dental Advisory Given  Plan Discussed with: Anesthesiologist, CRNA and Surgeon  Anesthesia Plan Comments: (Patient consented for risks of anesthesia including but not limited to:  - adverse reactions to medications - risk of airway placement if required - damage to eyes, teeth, lips or other oral mucosa - nerve damage due to positioning  - sore throat or hoarseness - Damage to heart, brain, nerves, lungs, other parts of body or loss of life  Patient voiced understanding and assent.)       Anesthesia Quick Evaluation

## 2023-03-05 NOTE — OR Nursing (Signed)
 Surgical Anastomosis reached at 0906.

## 2023-03-05 NOTE — H&P (Signed)
 Wyline Mood, MD [address], [address], [place], [place], [address] [address], [place], [place], [address] Phone: [phone]  Fax: [phone]  Primary Care Physician:  Preston Fleeting, MD   Pre-Procedure History & Physical: HPI:  Patrick Pittman is a 63 y.o. male is here for an colonoscopy.   Past Medical History:  Diagnosis Date   Cancer [facility])    prostate   H/O left radical nephrectomy    History of hiatal hernia    Hypertension    No blood products    Jehovah's witness    Past Surgical History:  Procedure Laterality Date   APPENDECTOMY     COLONOSCOPY     COLONOSCOPY WITH PROPOFOL N/A 05/23/2021   Procedure: COLONOSCOPY WITH PROPOFOL;  Surgeon: Wyline Mood, MD;  Location: [facility] ENDOSCOPY;  Service: Gastroenterology;  Laterality: N/A;   COLONOSCOPY WITH PROPOFOL N/A 11/18/2022   Procedure: COLONOSCOPY WITH PROPOFOL;  Surgeon: Wyline Mood, MD;  Location: [facility] ENDOSCOPY;  Service: Gastroenterology;  Laterality: N/A;   COLOSTOMY REVISION N/A 10/30/2021   Procedure: COLON RESECTION RIGHT; DIVERTING ILEOSTOMY, Lynden Oxford PAC to assist;  Surgeon: Henrene Dodge, MD;  Location: [facility] ORS;  Service: General;  Laterality: N/A;   CYSTOSCOPY WITH STENT PLACEMENT Right 10/30/2021   Procedure: CYSTOSCOPY WITH URETRAL  STENT PLACEMENT;  Surgeon: Vanna Scotland, MD;  Location: [facility] ORS;  Service: Urology;  Laterality: Right;   ILEOSTOMY CLOSURE N/A 01/29/2022   Procedure: ILEOSTOMY TAKEDOWN, open;  Surgeon: Henrene Dodge, MD;  Location: [facility] ORS;  Service: General;  Laterality: N/A;   LAPAROSCOPIC APPENDECTOMY N/A 07/23/2021   Procedure: APPENDECTOMY LAPAROSCOPIC HAND ASSISTED;  Surgeon: Henrene Dodge, MD;  Location: [facility] ORS;  Service: General;  Laterality: N/A;   LAPAROSCOPIC NEPHRECTOMY, HAND ASSISTED Left 07/23/2021   Procedure: HAND ASSISTED LAPAROSCOPIC RADICAL NEPHRECTOMY;  Surgeon: Vanna Scotland, MD;  Location: [facility] ORS;  Service: Urology;   Laterality: Left;   PARTIAL COLECTOMY N/A 10/30/2021   Procedure: PARTIAL COLECTOMY; SIGMOID COLECTOMY;  Surgeon: Henrene Dodge, MD;  Location: [facility] ORS;  Service: General;  Laterality: N/A;   RESECTION DISTAL CLAVICAL Right 07/26/2015   Procedure: Open excision of right distal clavicle.;  Surgeon: Christena Flake, MD;  Location: [facility];  Service: Orthopedics;  Laterality: Right;   TAKE DOWN OF INTESTINAL FISTULA  07/23/2021   Procedure: TAKE DOWN OF INTESTINAL FISTULA;  Surgeon: Henrene Dodge, MD;  Location: [facility] ORS;  Service: General;;    Prior to Admission medications   Medication Sig Start Date End Date Taking? Authorizing Provider  cyclobenzaprine (FLEXERIL) 5 MG tablet Take 1 tablet (5 mg total) by mouth 3 (three) times daily as needed for muscle spasms. 01/31/22  Yes Donovan Kail, PA-C  Multiple Vitamin (MULTIVITAMIN) capsule Take 1 capsule by mouth daily.   Yes [provider]  valsartan-hydrochlorothiazide (DIOVAN-HCT) 80-12.5 MG tablet Take 1 tablet by mouth daily. 02/08/21  Yes [provider]  loratadine (CLARITIN) 10 MG tablet Take 10 mg by mouth daily as needed. 05/24/21   [provider]  sildenafil (REVATIO) 20 MG tablet Take 1-5 tablets by mouth as needed 1 hour prior to intercourse 10/15/22   Vanna Scotland, MD  tamsulosin (FLOMAX) 0.4 MG CAPS capsule Take 1 capsule (0.4 mg total) by mouth daily. 10/15/22   Vanna Scotland, MD    Allergies as of 02/17/2023 - Review Complete 02/14/2023  Allergen Reaction Noted   Lisinopril Swelling 09/26/2021    Family History  Problem Relation Age of  Onset   Breast cancer Mother    Prostate cancer Father    Prostate cancer Brother    Prostate cancer Brother 36       metastatic   Prostate cancer Brother    Prostate cancer Brother    Prostate cancer Nephew 36    Social History   Socioeconomic History   Marital status: Married    Spouse name: Scientist, product/process development   Number of children: 3   Years of  education: Not on file   Highest education level: Not on file  Occupational History   Not on file  Tobacco Use   Smoking status: Never    Passive exposure: Never   Smokeless tobacco: Never  Vaping Use   Vaping status: Never Used  Substance and Sexual Activity   Alcohol use: Not Currently   Drug use: Never   Sexual activity: Not on file  Other Topics Concern   Not on file  Social History Narrative   ** Merged History Encounter **       Social Drivers of Health   Financial Resource Strain: Low Risk  (09/18/2021)   Overall Financial Resource Strain (CARDIA)    Difficulty of Paying Living Expenses: Not very hard  Food Insecurity: No Food Insecurity (01/29/2022)   Hunger Vital Sign    Worried About Running Out of Food in the Last Year: Never true    Ran Out of Food in the Last Year: Never true  Transportation Needs: No Transportation Needs (01/29/2022)   PRAPARE - Administrator, Civil Service (Medical): No    Lack of Transportation (Non-Medical): No  Physical Activity: Inactive (09/18/2021)   Exercise Vital Sign    Days of Exercise per Week: 0 days    Minutes of Exercise per Session: 0 min  Stress: Stress Concern Present (09/18/2021)   [employer] of Occupational Health - Occupational Stress Questionnaire    Feeling of Stress : To some extent  Social Connections: Socially Integrated (09/18/2021)   Social Connection and Isolation Panel [NHANES]    Frequency of Communication with Friends and Family: More than three times a week    Frequency of Social Gatherings with Friends and Family: Twice a week    Attends Religious Services: 1 to 4 times per year    Active Member of [employer] or Organizations: Yes    Attends Banker Meetings: 1 to 4 times per year    Marital Status: Married  Catering manager Violence: Not At Risk (01/29/2022)   Humiliation, Afraid, Rape, and Kick questionnaire    Fear of Current or Ex-Partner: No    Emotionally Abused: No     Physically Abused: No    Sexually Abused: No    Review of Systems: See HPI, otherwise negative ROS  Physical Exam: BP 131/74   Pulse 70   Temp (!) 96.8 F (36 C) (Temporal)   Resp 16   Wt 103.8 kg   SpO2 98%   BMI 32.83 kg/m  General:   Alert,  pleasant and cooperative in NAD Head:  Normocephalic and atraumatic. Neck:  Supple; no masses or thyromegaly. Lungs:  Clear throughout to auscultation, normal respiratory effort.    Heart:  +S1, +S2, Regular rate and rhythm, No edema. Abdomen:  Soft, nontender and nondistended. Normal bowel sounds, without guarding, and without rebound.   Neurologic:  Alert and  oriented x4;  grossly normal neurologically.  Impression/Plan: Patrick Pittman is here for an colonoscopy to be performed for Screening colonoscopy average  risk   Risks, benefits, limitations, and alternatives regarding  colonoscopy have been reviewed with the patient.  Questions have been answered.  All parties agreeable.   Wyline Mood, MD  03/05/2023, 8:19 AM

## 2023-03-06 ENCOUNTER — Encounter: Payer: Self-pay | Admitting: Gastroenterology

## 2023-03-06 ENCOUNTER — Inpatient Hospital Stay: Payer: BC Managed Care – PPO | Admitting: Oncology

## 2023-03-06 LAB — SURGICAL PATHOLOGY

## 2023-03-11 ENCOUNTER — Inpatient Hospital Stay: Payer: BC Managed Care – PPO | Admitting: Oncology

## 2023-03-18 ENCOUNTER — Inpatient Hospital Stay: Attending: Oncology | Admitting: Oncology

## 2023-03-18 ENCOUNTER — Encounter: Payer: Self-pay | Admitting: Oncology

## 2023-03-20 ENCOUNTER — Telehealth: Payer: Self-pay

## 2023-03-20 NOTE — Telephone Encounter (Signed)
-----   Message from Wyline Mood sent at 03/06/2023  9:56 AM EST ----- Bx benign repeat colonoscopy in 3 years

## 2023-03-20 NOTE — Telephone Encounter (Signed)
 Called patient verbalized understanding of results

## 2023-06-12 ENCOUNTER — Encounter: Payer: Self-pay | Admitting: Family Medicine

## 2023-06-12 ENCOUNTER — Ambulatory Visit (INDEPENDENT_AMBULATORY_CARE_PROVIDER_SITE_OTHER): Admitting: Family Medicine

## 2023-06-12 VITALS — BP 135/82 | HR 84 | Temp 97.4°F | Ht 70.5 in | Wt 225.0 lb

## 2023-06-12 DIAGNOSIS — I10 Essential (primary) hypertension: Secondary | ICD-10-CM

## 2023-06-12 DIAGNOSIS — J019 Acute sinusitis, unspecified: Secondary | ICD-10-CM

## 2023-06-12 DIAGNOSIS — Z7689 Persons encountering health services in other specified circumstances: Secondary | ICD-10-CM | POA: Diagnosis not present

## 2023-06-12 DIAGNOSIS — B9689 Other specified bacterial agents as the cause of diseases classified elsewhere: Secondary | ICD-10-CM | POA: Diagnosis not present

## 2023-06-12 MED ORDER — AMOXICILLIN-POT CLAVULANATE 875-125 MG PO TABS: 1.0000 | ORAL_TABLET | Freq: Two times a day (BID) | ORAL | 0 refills | Status: AC

## 2023-06-12 NOTE — Progress Notes (Signed)
 New Patient Office Visit  Subjective   Patient ID: Patrick Pittman, male    DOB: 1960/03/25  Age: 63 y.o. MRN: [id number]  CC:  Chief Complaint  Patient presents with   Establish Care   sinus congestion    HPI Patrick Pittman is a 63 year old male who presents to establish with [facility] at [facility].   CC: Patient here to establish care  Last PCP:  Specialists: GI, urology, oncology, nephrology  PMHx: prostate cancer, L radical nephrectomy, HTN  HTN: valsartan -hydrochlorothiazide  80-12.5mg  daily  Prostate: Flomax  0.4mg  daily & sildenafil  20mg  PRN  Seasonal allergies: loratadine  10mg  PRN  NASAL CONGESTION:  Presents today for an acute visit with complaint of sinus congestion.  Symptoms have been present for about 3 weeks.  Associated symptoms include: muffled sounds Pertinent negatives: fever/chills, cough, ear pain, sore throat, headache, sinus pressure, itchy eyes, watery eyes  Treatments tried include: Claritin , nasal saline spray   Outpatient Encounter Medications as of 06/12/2023  Medication Sig   amoxicillin-clavulanate (AUGMENTIN) 875-125 MG tablet Take 1 tablet by mouth 2 (two) times daily for 7 days.   loratadine  (CLARITIN ) 10 MG tablet Take 10 mg by mouth daily as needed.   sildenafil  (REVATIO ) 20 MG tablet Take 1-5 tablets by mouth as needed 1 hour prior to intercourse   tamsulosin  (FLOMAX ) 0.4 MG CAPS capsule Take 1 capsule (0.4 mg total) by mouth daily.   valsartan -hydrochlorothiazide  (DIOVAN -HCT) 80-12.5 MG tablet Take 1 tablet by mouth daily.   [DISCONTINUED] cyclobenzaprine  (FLEXERIL ) 5 MG tablet Take 1 tablet (5 mg total) by mouth 3 (three) times daily as needed for muscle spasms. (Patient not taking: Reported on 06/12/2023)   [DISCONTINUED] Multiple Vitamin (MULTIVITAMIN) capsule Take 1 capsule by mouth daily. (Patient not taking: Reported on 06/12/2023)   No facility-administered encounter medications on file as of 06/12/2023.    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   Past Medical History:  Diagnosis Date   Cancer [facility])    prostate   H/O left radical nephrectomy    History of hiatal hernia    Hypertension 2012   No blood products    Jehovah's witness   Past Surgical History:  Procedure Laterality Date   APPENDECTOMY  [id number]   BIOPSY  03/05/2023   Procedure: BIOPSY;  Surgeon: Luke Salaam, MD;  Location: [facility] ENDOSCOPY;  Service: Gastroenterology;;   COLON SURGERY  [id number] e [phone] o   COLONOSCOPY     COLONOSCOPY WITH PROPOFOL  N/A 05/23/2021   Procedure: COLONOSCOPY WITH PROPOFOL ;  Surgeon: Luke Salaam, MD;  Location[facility] ENDOSCOPY;  Service: Gastroenterology;  Laterality: N/A;   COLONOSCOPY WITH PROPOFOL  N/A 11/18/2022   Procedure: COLONOSCOPY WITH PROPOFOL ;  Surgeon: Luke Salaam, MD;  Location: [facility] ENDOSCOPY;  Service: Gastroenterology;  Laterality: N/A;   COLONOSCOPY WITH PROPOFOL  N/A 03/05/2023   Procedure: COLONOSCOPY WITH PROPOFOL ;  Surgeon: Luke Salaam, MD;  Location: [facility] ENDOSCOPY;  Service: Gastroenterology;  Laterality: N/A;   COLOSTOMY REVISION N/A 10/30/2021   Procedure: COLON RESECTION RIGHT; DIVERTING ILEOSTOMY, Zachary  Nowell Begun PAC to assist;  Surgeon: Emmalene Hare, MD;  Location: [facility] ORS;  Service: General;  Laterality: N/A;   CYSTOSCOPY WITH STENT PLACEMENT Right 10/30/2021   Procedure: CYSTOSCOPY WITH URETRAL  STENT PLACEMENT;  Surgeon: Dustin Gimenez, MD;  Location: [facility] ORS;  Service: Urology;  Laterality: Right;   ILEOSTOMY CLOSURE N/A  01/29/2022   Procedure: ILEOSTOMY TAKEDOWN, open;  Surgeon: Emmalene Hare, MD;  Location: [facility] ORS;  Service: General;  Laterality: N/A;   LAPAROSCOPIC APPENDECTOMY N/A 07/23/2021   Procedure: APPENDECTOMY LAPAROSCOPIC HAND ASSISTED;  Surgeon: Emmalene Hare, MD;  Location: [facility] ORS;  Service: General;  Laterality: N/A;   LAPAROSCOPIC NEPHRECTOMY, HAND ASSISTED Left 07/23/2021   Procedure: HAND ASSISTED LAPAROSCOPIC RADICAL NEPHRECTOMY;  Surgeon: Dustin Gimenez, MD;  Location: [facility] ORS;  Service: Urology;  Laterality: Left;   PARTIAL COLECTOMY N/A 10/30/2021   Procedure: PARTIAL COLECTOMY; SIGMOID COLECTOMY;  Surgeon: Emmalene Hare, MD;  Location: [facility] ORS;  Service: General;  Laterality: N/A;   RESECTION DISTAL CLAVICAL Right 07/26/2015   Procedure: Open excision of right distal clavicle.;  Surgeon: Elner Hahn, MD;  Location: [facility];  Service: Orthopedics;  Laterality: Right;   TAKE DOWN OF INTESTINAL FISTULA  07/23/2021   Procedure: TAKE DOWN OF INTESTINAL FISTULA;  Surgeon: Emmalene Hare, MD;  Location: [facility] ORS;  Service: General;;   Family History  Problem Relation Age of Onset   Breast cancer Mother    Prostate cancer Father    Prostate cancer Brother    Prostate cancer Brother 57       metastatic   Prostate cancer Brother    Prostate cancer Brother    Prostate cancer Nephew 39   Social History   Socioeconomic History   Marital status: Married    Spouse name: Scientist, product/process development   Number of children: 3   Years of education: Not on file   Highest education level: GED or equivalent  Occupational History   Not on file  Tobacco Use   Smoking status: Never    Passive exposure: Never   Smokeless tobacco: Never  Vaping Use   Vaping status: Never Used  Substance and Sexual Activity   Alcohol use: Never   Drug use: Never   Sexual activity: Yes    Birth control/protection: Pill  Other Topics Concern   Not on file  Social History Narrative   ** Merged History Encounter **        Social Drivers of Health   Financial Resource Strain: Low Risk  (06/11/2023)   Overall Financial Resource Strain (CARDIA)    Difficulty of Paying Living Expenses: Not very hard  Food Insecurity: No Food Insecurity (06/11/2023)   Hunger Vital Sign    Worried About Running Out of Food in the Last Year: Never true    Ran Out of Food in the Last Year: Never true  Transportation Needs: No Transportation Needs (06/11/2023)   PRAPARE - Administrator, Civil Service (Medical): No    Lack of Transportation (Non-Medical): No  Physical Activity: Sufficiently Active (06/11/2023)   Exercise Vital Sign    Days of Exercise per Week: 6 days    Minutes of Exercise per Session: 30 min  Stress: No Stress Concern Present (06/11/2023)   [employer] of Occupational Health - Occupational Stress Questionnaire    Feeling of Stress : Not at all  Social Connections: Socially Integrated (06/11/2023)   Social Connection and Isolation Panel [NHANES]    Frequency of Communication with Friends and Family: More  than three times a week    Frequency of Social Gatherings with Friends and Family: Once a week    Attends Religious Services: More than 4 times per year    Active Member of [employer] or Organizations: Yes    Attends Engineer, structural: More than 4 times per year    Marital Status: Married  Catering manager Violence: Not At Risk (01/29/2022)   Humiliation, Afraid, Rape, and Kick questionnaire    Fear of Current or Ex-Partner: No    Emotionally Abused: No    Physically Abused: No    Sexually Abused: No   Outpatient Medications Prior to Visit  Medication Sig Dispense Refill   loratadine  (CLARITIN ) 10 MG tablet Take 10 mg by mouth daily as needed.     sildenafil  (REVATIO ) 20 MG tablet Take 1-5 tablets by mouth as needed 1 hour prior to intercourse 30 tablet 11   tamsulosin  (FLOMAX ) 0.4 MG CAPS capsule Take 1 capsule (0.4 mg total) by mouth daily. 30 capsule 11    valsartan -hydrochlorothiazide  (DIOVAN -HCT) 80-12.5 MG tablet Take 1 tablet by mouth daily.     cyclobenzaprine  (FLEXERIL ) 5 MG tablet Take 1 tablet (5 mg total) by mouth 3 (three) times daily as needed for muscle spasms. (Patient not taking: Reported on 06/12/2023) 15 tablet 0   Multiple Vitamin (MULTIVITAMIN) capsule Take 1 capsule by mouth daily. (Patient not taking: Reported on 06/12/2023)     No facility-administered medications prior to visit.   Allergies  Allergen Reactions   Lisinopril Swelling   ROS: see HPI     Objective   Today's Vitals   06/12/23 1019  BP: (!) 150/84  Pulse: 97  Temp: (!) 97.4 F (36.3 C)  TempSrc: Oral  Weight: 225 lb (102.1 kg)  Height: 5' 10.5" (1.791 m)   Physical Exam Vitals reviewed.  Constitutional:      Appearance: Normal appearance.  HENT:     Right Ear: Tympanic membrane, ear canal and external ear normal.     Left Ear: Ear canal and external ear normal. Decreased hearing noted. A middle ear effusion is present.     Nose: Congestion and rhinorrhea present.     Right Turbinates: Enlarged and swollen.     Left Turbinates: Enlarged and swollen.     Right Sinus: No maxillary sinus tenderness or frontal sinus tenderness.     Left Sinus: No maxillary sinus tenderness or frontal sinus tenderness.     Mouth/Throat:     Mouth: Mucous membranes are moist.     Pharynx: Posterior oropharyngeal erythema and postnasal drip present. No oropharyngeal exudate or uvula swelling.     Tonsils: No tonsillar exudate or tonsillar abscesses.  Cardiovascular:     Rate and Rhythm: Normal rate and regular rhythm.     Pulses: Normal pulses.     Heart sounds: Normal heart sounds.  Pulmonary:     Effort: Pulmonary effort is normal.     Breath sounds: Normal breath sounds.  Musculoskeletal:     Right lower leg: No edema.     Left lower leg: No edema.  Lymphadenopathy:     Cervical: No cervical adenopathy.     Right cervical: No superficial cervical  adenopathy.    Left cervical: No superficial cervical adenopathy.  Neurological:     Mental Status: He is alert.  Psychiatric:        Mood and Affect: Mood normal.        Behavior: Behavior normal.  Assessment & Plan:   1. Encounter to establish care (Primary) Patient is a 78- year-old male who presents today to establish care with primary care at [facility]. Reviewed the past medical history, family history, social history, surgical history, medications and allergies today- updates made as indicated. Patient has concerns today about sinus congestion x3 weeks.   2. Primary hypertension Patient presents today with elevated blood pressure, repeat blood pressure elevated. Patient in no acute distress and is well-appearing. Denies chest pain, shortness of breath, lower extremity edema, vision changes, headaches. Cardiovascular exam with heart regular rate and rhythm. Normal heart sounds, no murmurs present. No lower extremity edema present. Lungs clear to auscultation bilaterally. Patient is currently taking valsartan -hydrochlorothiazide  80-12.5mg  daily. No refills needed. Advised patient to continue to occasionally monitor blood pressure at home and return to office sooner if blood pressure begins to increase greater than 130/80.   3. Acute bacterial rhinosinusitis Patient presents today with 3 weeks of nasal congestion.  Declines fever/chills, cough, ear pain, sore throat, headache, sinus pressure/pain, itchy eyes, and watery eyes. Physical exam remarkable for oropharyngeal erythema with postnasal drip present. Nasal turbinates edematous and erythematous with nasal congestion present. No frontal or maxillary sinus tenderness. Lungs clear to auscultation bilaterally in all lung fields. Due to duration of symptoms, will treat patient for acute bacterial rhinosinusitis. Advised him to reach out or return to office if symptoms worsen or persist after treatment. Patient verbalized an understanding and  all questions answered. Reviewed medication allergies. Augmentin sent to pharmacy on file.  - amoxicillin-clavulanate (AUGMENTIN) 875-125 MG tablet; Take 1 tablet by mouth 2 (two) times daily for 7 days.  Dispense: 14 tablet; Refill: 0   Return in about 3 months (around 09/12/2023) for chronic conditions .   Wilhelmena Hanson, FNP

## 2023-06-12 NOTE — Patient Instructions (Signed)
 MyChart:  For all urgent or time sensitive needs we ask that you please call the office to avoid delays. Our number is [phone]) [id number]. MyChart is not constantly monitored and due to the large volume of messages a day, replies may take up to 72 business hours.   MyChart Policy: MyChart allows for you to see your visit notes, after visit summary, provider recommendations, lab and tests results, make an appointment, request refills, and contact your provider or the office for non-urgent questions or concerns. Providers are seeing patients during normal business hours and do not have built in time to review MyChart messages.  We ask that you allow a minimum of 3 business days for responses to [employer]. For this reason, please do not send urgent requests through MyChart. Please call the office at [phone]. New and ongoing conditions may require a visit. We have virtual and in person visit available for your convenience.  Complex MyChart concerns may require a visit. Your provider may request you schedule a virtual or in person visit to ensure we are providing the best care possible. MyChart messages sent after 11:00 AM on Friday will not be received by the provider until Monday morning.    Lab and Test Results: You will receive your lab and test results on MyChart as soon as they are completed and results have been sent by the lab or testing facility. Due to this service, you will receive your results BEFORE your provider.  I review lab and tests results each morning prior to seeing patients. Some results require collaboration with other providers to ensure you are receiving the most appropriate care. For this reason, we ask that you please allow a minimum of 3-5 business days from the time the ALL results have been received for your provider to receive and review lab and test results and contact you about these.  Most lab and test result comments from the provider will be sent through MyChart.  Your provider may recommend changes to the plan of care, follow-up visits, repeat testing, ask questions, or request an office visit to discuss these results. You may reply directly to this message or call the office at [phone] to provide information for the provider or set up an appointment. In some instances, you will be called with test results and recommendations. Please let us know if this is preferred and we will make note of this in your chart to provide this for you.    If you have not heard a response to your lab or test results in 5 business days from all results returning to MyChart, please call the office to let us know. We ask that you please avoid calling prior to this time unless there is an emergent concern. Due to high call volumes, this can delay the resulting process.   After Hours: For all non-emergency after hours needs, please call the office at [phone] and select the option to reach the on-call provider service. On-call services are shared between multiple [facility] offices and therefore it will not be possible to speak directly with your provider. On-call providers may provide medical advice and recommendations, but are unable to provide refills for maintenance medications.  For all emergency or urgent medical needs after normal business hours, we recommend that you seek care at the closest Urgent Care or Emergency Department to ensure appropriate treatment in a timely manner.  [facility] has a 24 hour emergency room located on the ground floor for your  convenience.    Urgent Concerns During the Business Day Providers are seeing patients from 8AM to 5PM, Monday through Thursday, and 8AM to 12PM on Friday with a busy schedule and are most often not able to respond to non-urgent calls until the end of the day or the next business day. If you should have URGENT concerns during the day, please call and speak to the nurse or schedule a same day  appointment so that we can address your concern without delay.    Thank you, again, for choosing me as your health care partner. I appreciate your trust and look forward to learning more about you.    Patrick Reedy, FNP-C

## 2023-07-08 ENCOUNTER — Other Ambulatory Visit: Payer: Self-pay | Admitting: Family Medicine

## 2023-07-08 DIAGNOSIS — I10 Essential (primary) hypertension: Secondary | ICD-10-CM

## 2023-07-08 MED ORDER — BLOOD PRESSURE KIT DEVI
1.0000 | Freq: Every day | 0 refills | Status: DC
Start: 2023-07-08 — End: 2023-07-08

## 2023-07-08 MED ORDER — BLOOD PRESSURE KIT DEVI: 1.0000 | Freq: Every day | 0 refills | Status: AC

## 2023-07-09 ENCOUNTER — Telehealth: Payer: Self-pay

## 2023-07-09 NOTE — Progress Notes (Signed)
 Complex Care Management Note  Care Guide Note 07/09/2023 Name: Patrick Pittman MRN: [id number] DOB: 1960/04/25  Patrick Pittman is a 63 y.o. year old male who sees Towana Small, FNP for primary care. I reached out to Artis JONETTA Culver by phone today to offer complex care management services.  [name] was given information about Complex Care Management services today including:   The Complex Care Management services include support from the care team which includes your Nurse Care Manager, Clinical Social Worker, or Pharmacist.  The Complex Care Management team is here to help remove barriers to the health concerns and goals most important to you. Complex Care Management services are voluntary, and the patient may decline or stop services at any time by request to their care team member.   Complex Care Management Consent Status: Patient agreed to services and verbal consent obtained.   Follow up plan:  Telephone appointment with complex care management team member scheduled for:  07/21/2023  Encounter Outcome:  Patient Scheduled  Jeoffrey Buffalo , RMA     [place]  [facility], [facility] Guide  Direct Dial: [phone]  Website: [email or web address]

## 2023-07-21 ENCOUNTER — Other Ambulatory Visit: Payer: Self-pay

## 2023-07-21 NOTE — Patient Outreach (Signed)
 Complex Care Management   Visit Note  07/21/2023  Name:  Patrick Pittman MRN: [id number] DOB: 05/21/1960  Situation: Referral received for Complex Care Management related to HTN. I obtained verbal consent from Patient.  Visit completed with Patrick Pittman  on the phone.  Main concern is obtaining a BP monitor, states his insurance is supposed to pay for one but he isn't sure where to pick it up. He is working with PCP to make sure BP is at goal: 130/80  Background:   Past Medical History:  Diagnosis Date   Cancer [facility])    prostate   H/O left radical nephrectomy    History of hiatal hernia    Hypertension 2012   No blood products    Jehovah's witness    Assessment: Patient Reported Symptoms:  Cognitive Cognitive Status: Alert and oriented to person, place, and time, Insightful and able to interpret abstract concepts, Normal speech and language skills      Neurological Neurological Review of Symptoms: No symptoms reported    HEENT HEENT Symptoms Reported: No symptoms reported      Cardiovascular Cardiovascular Symptoms Reported: No symptoms reported    Respiratory Respiratory Symptoms Reported: No symptoms reported    Endocrine Endocrine Symptoms Reported: No symptoms reported Is patient diabetic?: No    Gastrointestinal Gastrointestinal Symptoms Reported: No symptoms reported      Genitourinary Genitourinary Symptoms Reported: No symptoms reported    Integumentary Integumentary Symptoms Reported: No symptoms reported    Musculoskeletal     Falls in the past year?: No    Psychosocial Psychosocial Symptoms Reported: No symptoms reported     Do you feel physically threatened by others?: No      07/21/2023    2:13 PM  Depression screen PHQ 2/9  Decreased Interest 0  Down, Depressed, Hopeless 0  PHQ - 2 Score 0    There were no vitals filed for this visit.  Medications Reviewed Today     Reviewed by Lucian Santana LABOR, RN (Registered Nurse) on 07/21/23 at 1409   Med List Status: <None>   Medication Order Taking? Sig Documenting Provider Last Dose Status Informant  Blood Pressure Monitoring (BLOOD PRESSURE KIT) DEVI [id number] Yes 1 each by Does not apply route daily. Towana Small, FNP  Active   loratadine  (CLARITIN ) 10 MG tablet [id number] Yes Take 10 mg by mouth daily as needed. [provider]  Active Self  sildenafil  (REVATIO ) 20 MG tablet [id number] Yes Take 1-5 tablets by mouth as needed 1 hour prior to intercourse Penne Knee, MD  Active   tamsulosin  (FLOMAX ) 0.4 MG CAPS capsule [id number] Yes Take 1 capsule (0.4 mg total) by mouth daily. Penne Knee, MD  Active   valsartan -hydrochlorothiazide  (DIOVAN -HCT) 80-12.5 MG tablet [id number] Yes Take 1 tablet by mouth daily. [provider]  Active Self            Recommendation:   Continue Current Plan of Care -Take BP once daily and record.  Call PCP if BP's are trending above 140/90.    Follow Up Plan:   Telephone follow-up two weeks  Santana Lucian BSN, CCM [place]  [facility] Population Health RN Care Manager Direct Dial: [phone]  Fax: [phone]

## 2023-07-21 NOTE — Patient Instructions (Signed)
 Visit Information  Thank you for taking time to visit with me today. Please don't hesitate to contact me if I can be of assistance to you before our next scheduled appointment.    Our next appointment is by telephone on Tuesday, July 29th at 1:45pm.  Please call the care guide team at [phone] if you need to cancel or reschedule your appointment.   Following is a copy of your care plan:   Goals Addressed             This Visit's Progress    VBCI RN Care Plan       Problems:  Chronic Disease Management support and education needs related to HTN  Goal: Over the next 30 days the Patient will demonstrate Improved adherence to prescribed treatment plan for HTN as evidenced by BP's averaging at 130/80 as set by PCP  Interventions:   Hypertension Interventions: Last practice recorded BP readings:  BP Readings from Last 3 Encounters:  06/12/23 135/82  03/05/23 107/70  02/14/23 123/79   Most recent eGFR/CrCl: No results found for: EGFR  No components found for: CRCL  Reviewed medications with patient and discussed importance of compliance Provided assistance with obtaining home blood pressure monitor via health insurance plan, [employer]. ; Advised patient to discuss BP's that are trending over 140/90 with provider Screening for signs and symptoms of depression related to chronic disease state  Assessed social determinant of health barriers  Patient Self-Care Activities:  Attend all scheduled provider appointments Call provider office for new concerns or questions  Take medications as prescribed   check blood pressure daily write blood pressure results in a log or diary take medications for blood pressure exactly as prescribed Call [facility] insurance to inquire where he can pick up BP monitor (he states his insurance is supposed to supply but isn't sure where to get it).   Plan:  Telephone follow up appointment with care management team member scheduled for:  two  weeks.             A reminder to ALL patients/family/friends, please call the [place]  National Suicide Prevention Lifeline: 979-435-2626 or TTY: 410-688-4744 TTY 917-792-1113) to talk to a trained counselor if you are experiencing a Mental Health or Behavioral Health Crisis or need someone to talk to.  Patient verbalizes understanding of instructions and care plan provided today and agrees to view in MyChart. Active MyChart status and patient understanding of how to access instructions and care plan via MyChart confirmed with patient.     Santana Stamp BSN, CCM [place]  [facility] Population Health RN Care Manager Direct Dial: [phone]  Fax: [phone]

## 2023-08-05 ENCOUNTER — Telehealth: Payer: Self-pay

## 2023-08-13 ENCOUNTER — Telehealth: Payer: Self-pay

## 2023-08-22 ENCOUNTER — Ambulatory Visit: Admitting: Surgery

## 2023-08-23 IMAGING — NM NM BONE WHOLE BODY
2 series · 8 of 8 positions shown · non-contrast
Comparison: None available.

Radiographic correlation: CT abdomen and pelvis 04/17/2021

CLINICAL DATA: High-risk prostate cancer, staging, PSA

EXAM:
NUCLEAR MEDICINE WHOLE BODY BONE SCAN
TECHNIQUE: Whole body anterior and posterior images were obtained approximately
3 hours after intravenous injection of radiopharmaceutical.
RADIOPHARMACEUTICALS:  19.86 mCi Sechnetium-22m MDP IV

[Series 1000: 3 hr wholebody · 2.40mm/px · 2 of 2 frames shown]
[frame 1/2]
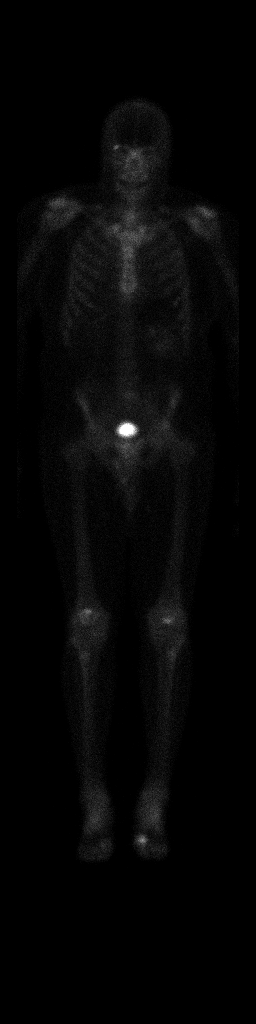
[frame 2/2]
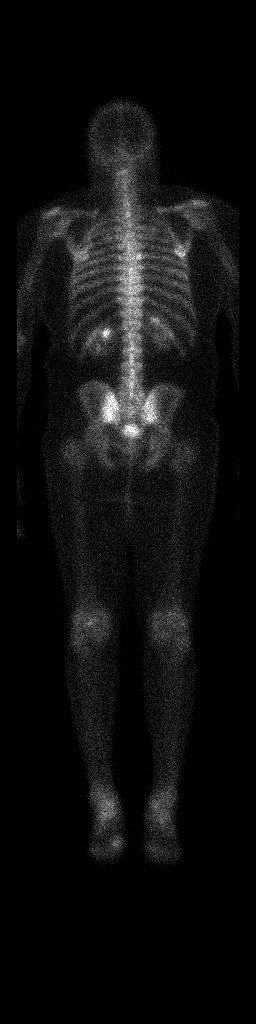

[Series 1000: statics · 2.40mm/px · 3 acquisitions, 6 frames shown]
[im 1/3]
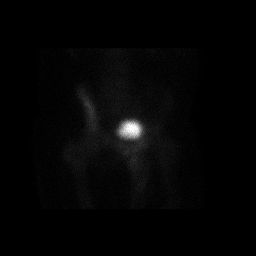
[im 1/3]
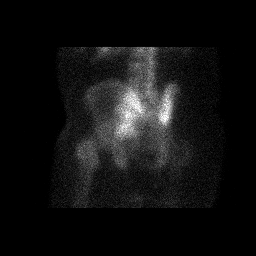
[im 2/3]
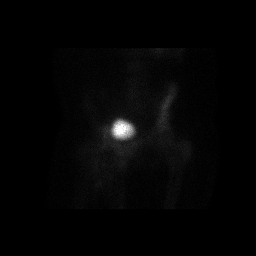
[im 2/3]
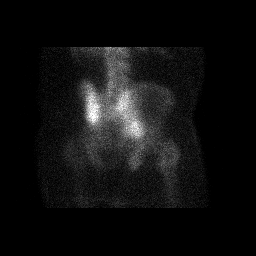
[im 3/3]
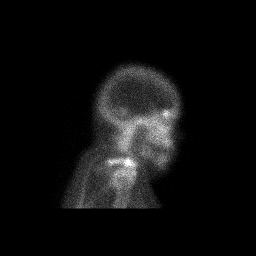
[im 3/3]
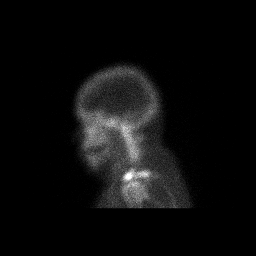

[8 of 8 positions shown; findings below may reference images not displayed]

FINDINGS: Foci of abnormal increased tracer uptake at the superolateral margin
of the RIGHT orbit anterior LEFT iliac bone, cannot exclude
metastases.

Uptake at the shoulders, sternoclavicular joints, hips, knees, RIGHT
foot, typically degenerative.

Asymmetry of the SI joints, greater uptake on LEFT, favor related to
slight patient rotation.

Focal tracer uptake at RIGHT sixth costovertebral junction, could be
degenerative or metastatic.

No additional sites of worrisome tracer uptake are identified.

Expected urinary tract and soft tissue distribution of tracer.
IMPRESSION: Abnormal tracer uptake at the superolateral RIGHT orbit and at the
anterior LEFT iliac bone suspicious for osseous metastases.

Additional nonspecific findings as above.

No abnormal tracer uptake is seen at multiple small additional
sclerotic osseous foci identified on recent CT.

## 2023-08-26 ENCOUNTER — Encounter: Payer: Self-pay | Admitting: Urology

## 2023-09-04 ENCOUNTER — Telehealth: Payer: Self-pay

## 2023-09-04 NOTE — Telephone Encounter (Signed)
 Message left for the patient to call to reschedule his follow up with Dr Desiderio that he missed.

## 2023-09-12 ENCOUNTER — Ambulatory Visit: Admitting: Family Medicine

## 2023-09-16 ENCOUNTER — Ambulatory Visit: Admitting: Family Medicine

## 2023-09-22 ENCOUNTER — Other Ambulatory Visit: Payer: Self-pay

## 2023-09-22 DIAGNOSIS — C61 Malignant neoplasm of prostate: Secondary | ICD-10-CM

## 2023-09-24 ENCOUNTER — Other Ambulatory Visit: Payer: Self-pay | Admitting: Otolaryngology

## 2023-09-24 DIAGNOSIS — J329 Chronic sinusitis, unspecified: Secondary | ICD-10-CM

## 2023-09-29 ENCOUNTER — Ambulatory Visit

## 2023-10-06 ENCOUNTER — Ambulatory Visit

## 2023-10-07 ENCOUNTER — Ambulatory Visit
Admission: RE | Admit: 2023-10-07 | Discharge: 2023-10-07 | Disposition: A | Discharge status: Home / Self Care | Source: Ambulatory Visit | Attending: Otolaryngology | Admitting: Otolaryngology

## 2023-10-07 DIAGNOSIS — J329 Chronic sinusitis, unspecified: Secondary | ICD-10-CM | POA: Diagnosis present

## 2023-10-07 DIAGNOSIS — J339 Nasal polyp, unspecified: Secondary | ICD-10-CM | POA: Insufficient documentation

## 2023-10-10 ENCOUNTER — Other Ambulatory Visit: Payer: Self-pay

## 2023-10-14 ENCOUNTER — Ambulatory Visit: Payer: Self-pay | Admitting: Urology

## 2023-10-14 ENCOUNTER — Other Ambulatory Visit

## 2023-10-15 ENCOUNTER — Encounter: Payer: Self-pay | Admitting: Urology

## 2023-10-16 ENCOUNTER — Ambulatory Visit: Admitting: Urology

## 2023-11-04 ENCOUNTER — Other Ambulatory Visit: Payer: Self-pay | Admitting: Medical Genetics

## 2023-11-04 DIAGNOSIS — Z006 Encounter for examination for normal comparison and control in clinical research program: Secondary | ICD-10-CM

## 2023-11-10 ENCOUNTER — Ambulatory Visit: Admitting: Urology

## 2023-11-19 DIAGNOSIS — Z905 Acquired absence of kidney: Secondary | ICD-10-CM | POA: Insufficient documentation

## 2023-11-19 DIAGNOSIS — C61 Malignant neoplasm of prostate: Secondary | ICD-10-CM | POA: Insufficient documentation

## 2023-11-19 DIAGNOSIS — N529 Male erectile dysfunction, unspecified: Secondary | ICD-10-CM | POA: Insufficient documentation

## 2023-11-19 NOTE — Assessment & Plan Note (Addendum)
 Hx of intermediate-risk prostate Ca  - low-risk prostate Ca (dx 2021)  - upstaged to GS3+4 in 2023, PSA 14.6  - s/p XRT in June 2024 + 55mo ADT  - PSA 0.01 (Sept 2024)  - PSA due today, continue q6 mo PSA surveillance - continue interval follow up with Radiation Oncology

## 2023-11-19 NOTE — Assessment & Plan Note (Addendum)
 On sildenafil  25-100mg  PRN  - Refilled Revatio  25-100mg  today

## 2023-11-19 NOTE — Progress Notes (Signed)
   11/24/2023 8:34 AM   Patrick Pittman 1960/11/27 [id number]  Reason for visit: Follow up prostate Ca   HPI: 63 y.o. male, initial follow up with me today, previously seen by Dr. 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  Dispense: 30 capsule; Refill: 11  Solitary kidney, acquired Assessment & Plan: Solitary Right kidney   -s/p Left nephrectomy in 2023 (benign neoplasm)  Cr stable at 1.8 Emphasized renal protective lifestyle, diet, HTN management   ED (erectile  dysfunction) of organic origin Assessment & Plan: On sildenafil  25-100mg  PRN  - Refilled Revatio  25-100mg  today   Orders: -     Sildenafil  Citrate; Take 2-4 tablets (50-100 mg total) by mouth as needed for erectile dysfunction.  Dispense: 10 tablet; Refill: 3  Benign prostatic hyperplasia, unspecified whether lower urinary tract symptoms present Assessment & Plan: PVR 48cc today IPSS 7/0 Stable urinary habits, no issues today  - Refilled Flomax  - Follow up in year with PVR, symptom check  Orders: -     Bladder Scan (Post Void Residual) in office       Penne JONELLE Skye, MD  [facility] [address], [address] [place], [place] [address] [phone]

## 2023-11-19 NOTE — Assessment & Plan Note (Signed)
 Solitary Right kidney   -s/p Left nephrectomy in 2023 (benign neoplasm)  Cr stable at 1.8 Emphasized renal protective lifestyle, diet, HTN management

## 2023-11-24 ENCOUNTER — Ambulatory Visit: Admitting: Urology

## 2023-11-24 VITALS — BP 135/75 | HR 75 | Ht 70.5 in | Wt 230.0 lb

## 2023-11-24 DIAGNOSIS — N529 Male erectile dysfunction, unspecified: Secondary | ICD-10-CM

## 2023-11-24 DIAGNOSIS — Z905 Acquired absence of kidney: Secondary | ICD-10-CM

## 2023-11-24 DIAGNOSIS — N400 Enlarged prostate without lower urinary tract symptoms: Secondary | ICD-10-CM | POA: Insufficient documentation

## 2023-11-24 DIAGNOSIS — C61 Malignant neoplasm of prostate: Secondary | ICD-10-CM | POA: Diagnosis not present

## 2023-11-24 LAB — BLADDER SCAN AMB NON-IMAGING

## 2023-11-24 MED ORDER — SILDENAFIL CITRATE 25 MG PO TABS
50.0000 mg | ORAL_TABLET | ORAL | 3 refills | Status: AC | PRN
Start: 2023-11-24 — End: 2024-11-23

## 2023-11-24 MED ORDER — TAMSULOSIN HCL 0.4 MG PO CAPS: 0.4000 mg | ORAL_CAPSULE | Freq: Every day | ORAL | 11 refills | Status: AC

## 2023-11-24 NOTE — Patient Instructions (Signed)
 Call to schedule your Renal Ultrasound appointment [phone].

## 2023-11-24 NOTE — Addendum Note (Signed)
 Addended by: Corina Stacy E on: 11/24/2023 08:42 AM   Modules accepted: Orders

## 2023-11-24 NOTE — Addendum Note (Signed)
 Addended by: GENITA HARLENE CROME on: 11/24/2023 08:57 AM   Modules accepted: Orders

## 2023-11-24 NOTE — Assessment & Plan Note (Addendum)
 PVR 48cc today IPSS 7/0 Stable urinary habits, no issues today  - Refilled Flomax  - Follow up in year with PVR, symptom check

## 2023-11-24 NOTE — Addendum Note (Signed)
 Addended by: GENITA HARLENE CROME on: 11/24/2023 08:52 AM   Modules accepted: Orders

## 2024-01-09 ENCOUNTER — Other Ambulatory Visit: Payer: Self-pay

## 2024-01-09 NOTE — Patient Instructions (Signed)
 Visit Information  Thank you for taking time to visit with me today. Please don't hesitate to contact me if I can be of assistance to you before our next scheduled appointment.  Your next care management appointment is no further scheduled appointments.   Patient has met all care management goals. Care Management case will be closed. Patient has been provided contact information should new needs arise.   Please call the care guide team at [phone] if you need to cancel, schedule, or reschedule an appointment.   Please call the [place]  National Suicide Prevention Lifeline: 650 389 3178 or TTY: 734-881-6531 TTY 6088453451) to talk to a trained counselor if you are experiencing a Mental Health or Behavioral Health Crisis or need someone to talk to.  Santana Stamp BSN, CCM [place]  VBCI Population Health RN Care Manager Direct Dial: [phone]  Fax: [phone]

## 2024-01-09 NOTE — Patient Outreach (Signed)
 Complex Care Management   Visit Note  01/09/2024  Name:  Patrick Pittman MRN: [id number] DOB: Feb 07, 1960  Situation: Referral received for Complex Care Management related to HTN. I obtained verbal consent from Patient.  Visit completed with Patrick Pittman  on the phone  Background:   Past Medical History:  Diagnosis Date   Cancer [facility])    prostate   H/O left radical nephrectomy    History of hiatal hernia    Hypertension 2012   No blood products    Jehovah's witness    Assessment: Patient Reported Symptoms:  Cognitive Cognitive Status: Alert and oriented to person, place, and time, Insightful and able to interpret abstract concepts, Normal speech and language skills      Neurological Neurological Review of Symptoms: No symptoms reported    HEENT HEENT Symptoms Reported: No symptoms reported      Cardiovascular Cardiovascular Symptoms Reported: No symptoms reported    Respiratory Respiratory Symptoms Reported: No symptoms reported    Endocrine Endocrine Symptoms Reported: Not assessed Is patient diabetic?: No    Gastrointestinal Gastrointestinal Symptoms Reported: Not assessed      Genitourinary Genitourinary Symptoms Reported: No symptoms reported Additional Genitourinary Details: Saw Urology on 11/24/23 with follow up in one year or if any problems arise.    Integumentary Integumentary Symptoms Reported: Not assessed    Musculoskeletal Musculoskelatal Symptoms Reviewed: Not assessed        Psychosocial Psychosocial Symptoms Reported: Not assessed          01/09/2024    PHQ2-9 Depression Screening   Little interest or pleasure in doing things    Feeling down, depressed, or hopeless    PHQ-2 - Total Score    Trouble falling or staying asleep, or sleeping too much    Feeling tired or having little energy    Poor appetite or overeating     Feeling bad about yourself - or that you are a failure or have let yourself or your family down    Trouble concentrating on  things, such as reading the newspaper or watching television    Moving or speaking so slowly that other people could have noticed.  Or the opposite - being so fidgety or restless that you have been moving around a lot more than usual    Thoughts that you would be better off dead, or hurting yourself in some way    PHQ2-9 Total Score    If you checked off any problems, how difficult have these problems made it for you to do your work, take care of things at home, or get along with other people    Depression Interventions/Treatment      There were no vitals filed for this visit.    MEDICATIONS: Denies concerns with meds today, no issues with refills.    Recommendation:   Continue Current Plan of Care  Follow Up Plan:   Patient has met all care management goals. Care Management case will be closed. Patient has been provided contact information should new needs arise.   Santana Stamp BSN, CCM [place]  VBCI Population Health RN Care Manager Direct Dial: [phone]  Fax: [phone]

## 2024-03-25 ENCOUNTER — Ambulatory Visit: Admitting: Urology

## 2024-11-24 ENCOUNTER — Ambulatory Visit: Admitting: Urology
